# Patient Record
Sex: Female | Born: 1960 | Race: White | Hispanic: Yes | Marital: Married | Smoking: Never smoker
Health system: Southern US, Community
[De-identification: ages and names within clinical notes are randomized; demographics above are authoritative.]

## PROBLEM LIST (undated history)

## (undated) DIAGNOSIS — N289 Disorder of kidney and ureter, unspecified: Secondary | ICD-10-CM

## (undated) DIAGNOSIS — E669 Obesity, unspecified: Secondary | ICD-10-CM

## (undated) DIAGNOSIS — I10 Essential (primary) hypertension: Secondary | ICD-10-CM

## (undated) DIAGNOSIS — E119 Type 2 diabetes mellitus without complications: Secondary | ICD-10-CM

## (undated) DIAGNOSIS — H3530 Unspecified macular degeneration: Secondary | ICD-10-CM

## (undated) DIAGNOSIS — E7800 Pure hypercholesterolemia, unspecified: Secondary | ICD-10-CM

## (undated) DIAGNOSIS — Z992 Dependence on renal dialysis: Secondary | ICD-10-CM

## (undated) DIAGNOSIS — F32A Depression, unspecified: Secondary | ICD-10-CM

## (undated) DIAGNOSIS — F329 Major depressive disorder, single episode, unspecified: Secondary | ICD-10-CM

## (undated) HISTORY — PX: BACK SURGERY: SHX140

---

## 1997-11-25 ENCOUNTER — Other Ambulatory Visit: Admission: RE | Admit: 1997-11-25 | Discharge: 1997-11-25 | Payer: Self-pay | Admitting: Gynecology

## 1998-10-13 ENCOUNTER — Emergency Department (HOSPITAL_COMMUNITY): Admission: EM | Admit: 1998-10-13 | Discharge: 1998-10-13 | Payer: Self-pay | Admitting: Emergency Medicine

## 1998-10-13 ENCOUNTER — Encounter: Payer: Self-pay | Admitting: Emergency Medicine

## 1999-07-12 ENCOUNTER — Emergency Department (HOSPITAL_COMMUNITY): Admission: EM | Admit: 1999-07-12 | Discharge: 1999-07-12 | Payer: Self-pay | Admitting: Emergency Medicine

## 2000-02-14 ENCOUNTER — Emergency Department (HOSPITAL_COMMUNITY): Admission: EM | Admit: 2000-02-14 | Discharge: 2000-02-14 | Payer: Self-pay | Admitting: Emergency Medicine

## 2000-06-10 ENCOUNTER — Observation Stay (HOSPITAL_COMMUNITY): Admission: RE | Admit: 2000-06-10 | Discharge: 2000-06-11 | Payer: Self-pay | Admitting: Orthopedic Surgery

## 2000-07-07 ENCOUNTER — Inpatient Hospital Stay (HOSPITAL_COMMUNITY): Admission: RE | Admit: 2000-07-07 | Discharge: 2000-07-14 | Payer: Self-pay | Admitting: Orthopedic Surgery

## 2000-07-11 ENCOUNTER — Encounter: Payer: Self-pay | Admitting: Orthopedic Surgery

## 2000-07-13 ENCOUNTER — Encounter: Payer: Self-pay | Admitting: Infectious Diseases

## 2001-11-27 ENCOUNTER — Encounter: Payer: Self-pay | Admitting: Family Medicine

## 2001-11-27 ENCOUNTER — Inpatient Hospital Stay (HOSPITAL_COMMUNITY): Admission: EM | Admit: 2001-11-27 | Discharge: 2001-11-28 | Payer: Self-pay | Admitting: *Deleted

## 2001-12-05 ENCOUNTER — Encounter: Admission: RE | Admit: 2001-12-05 | Discharge: 2001-12-05 | Payer: Self-pay | Admitting: Family Medicine

## 2002-01-09 ENCOUNTER — Emergency Department (HOSPITAL_COMMUNITY): Admission: EM | Admit: 2002-01-09 | Discharge: 2002-01-10 | Payer: Self-pay | Admitting: Emergency Medicine

## 2002-01-10 ENCOUNTER — Encounter: Payer: Self-pay | Admitting: Emergency Medicine

## 2002-09-15 ENCOUNTER — Emergency Department (HOSPITAL_COMMUNITY): Admission: EM | Admit: 2002-09-15 | Discharge: 2002-09-16 | Payer: Self-pay | Admitting: Emergency Medicine

## 2003-11-23 ENCOUNTER — Ambulatory Visit (HOSPITAL_COMMUNITY): Admission: RE | Admit: 2003-11-23 | Discharge: 2003-11-23 | Payer: Self-pay | Admitting: *Deleted

## 2003-11-28 ENCOUNTER — Encounter: Admission: RE | Admit: 2003-11-28 | Discharge: 2003-11-28 | Payer: Self-pay | Admitting: Orthopedic Surgery

## 2003-12-10 ENCOUNTER — Ambulatory Visit (HOSPITAL_COMMUNITY): Admission: RE | Admit: 2003-12-10 | Discharge: 2003-12-10 | Payer: Self-pay | Admitting: Orthopedic Surgery

## 2004-08-26 ENCOUNTER — Ambulatory Visit (HOSPITAL_COMMUNITY): Admission: RE | Admit: 2004-08-26 | Discharge: 2004-08-26 | Payer: Self-pay | Admitting: Family Medicine

## 2004-09-06 ENCOUNTER — Inpatient Hospital Stay (HOSPITAL_COMMUNITY): Admission: EM | Admit: 2004-09-06 | Discharge: 2004-09-12 | Payer: Self-pay | Admitting: Emergency Medicine

## 2004-09-06 ENCOUNTER — Ambulatory Visit: Payer: Self-pay | Admitting: Internal Medicine

## 2004-09-06 ENCOUNTER — Ambulatory Visit: Payer: Self-pay | Admitting: Infectious Diseases

## 2004-09-25 ENCOUNTER — Ambulatory Visit: Payer: Self-pay | Admitting: Internal Medicine

## 2004-09-28 ENCOUNTER — Ambulatory Visit: Payer: Self-pay | Admitting: Internal Medicine

## 2004-10-16 ENCOUNTER — Inpatient Hospital Stay (HOSPITAL_COMMUNITY): Admission: RE | Admit: 2004-10-16 | Discharge: 2004-10-18 | Payer: Self-pay | Admitting: Orthopedic Surgery

## 2004-11-10 ENCOUNTER — Ambulatory Visit: Payer: Self-pay | Admitting: Internal Medicine

## 2004-11-18 ENCOUNTER — Ambulatory Visit: Payer: Self-pay | Admitting: Internal Medicine

## 2004-11-19 ENCOUNTER — Ambulatory Visit: Payer: Self-pay | Admitting: Internal Medicine

## 2004-11-24 ENCOUNTER — Encounter: Admission: RE | Admit: 2004-11-24 | Discharge: 2005-02-22 | Payer: Self-pay | Admitting: Orthopedic Surgery

## 2004-11-25 ENCOUNTER — Ambulatory Visit: Payer: Self-pay | Admitting: Internal Medicine

## 2004-12-01 ENCOUNTER — Ambulatory Visit (HOSPITAL_COMMUNITY): Admission: RE | Admit: 2004-12-01 | Discharge: 2004-12-01 | Payer: Self-pay | Admitting: Internal Medicine

## 2004-12-09 ENCOUNTER — Ambulatory Visit: Payer: Self-pay | Admitting: Hospitalist

## 2004-12-17 ENCOUNTER — Ambulatory Visit: Payer: Self-pay | Admitting: Internal Medicine

## 2005-01-26 ENCOUNTER — Ambulatory Visit: Payer: Self-pay | Admitting: Internal Medicine

## 2005-02-08 ENCOUNTER — Ambulatory Visit: Payer: Self-pay | Admitting: Internal Medicine

## 2006-03-10 DIAGNOSIS — E1029 Type 1 diabetes mellitus with other diabetic kidney complication: Secondary | ICD-10-CM

## 2006-03-10 DIAGNOSIS — I10 Essential (primary) hypertension: Secondary | ICD-10-CM | POA: Insufficient documentation

## 2006-03-10 DIAGNOSIS — E785 Hyperlipidemia, unspecified: Secondary | ICD-10-CM

## 2006-03-10 DIAGNOSIS — E1065 Type 1 diabetes mellitus with hyperglycemia: Secondary | ICD-10-CM

## 2013-12-12 ENCOUNTER — Inpatient Hospital Stay (HOSPITAL_COMMUNITY): Payer: PRIVATE HEALTH INSURANCE

## 2013-12-12 ENCOUNTER — Encounter (HOSPITAL_COMMUNITY): Payer: Self-pay | Admitting: Emergency Medicine

## 2013-12-12 ENCOUNTER — Emergency Department (HOSPITAL_COMMUNITY): Payer: PRIVATE HEALTH INSURANCE

## 2013-12-12 ENCOUNTER — Inpatient Hospital Stay (HOSPITAL_COMMUNITY)
Admission: EM | Admit: 2013-12-12 | Discharge: 2013-12-20 | DRG: 264 | Disposition: A | Discharge status: Home / Self Care | Payer: PRIVATE HEALTH INSURANCE | Attending: Internal Medicine | Admitting: Internal Medicine

## 2013-12-12 DIAGNOSIS — D649 Anemia, unspecified: Secondary | ICD-10-CM

## 2013-12-12 DIAGNOSIS — E1065 Type 1 diabetes mellitus with hyperglycemia: Secondary | ICD-10-CM | POA: Diagnosis present

## 2013-12-12 DIAGNOSIS — E669 Obesity, unspecified: Secondary | ICD-10-CM | POA: Diagnosis present

## 2013-12-12 DIAGNOSIS — N2581 Secondary hyperparathyroidism of renal origin: Secondary | ICD-10-CM

## 2013-12-12 DIAGNOSIS — Z992 Dependence on renal dialysis: Secondary | ICD-10-CM

## 2013-12-12 DIAGNOSIS — F329 Major depressive disorder, single episode, unspecified: Secondary | ICD-10-CM | POA: Diagnosis present

## 2013-12-12 DIAGNOSIS — I255 Ischemic cardiomyopathy: Secondary | ICD-10-CM | POA: Diagnosis present

## 2013-12-12 DIAGNOSIS — N179 Acute kidney failure, unspecified: Secondary | ICD-10-CM

## 2013-12-12 DIAGNOSIS — E1122 Type 2 diabetes mellitus with diabetic chronic kidney disease: Secondary | ICD-10-CM

## 2013-12-12 DIAGNOSIS — E1029 Type 1 diabetes mellitus with other diabetic kidney complication: Secondary | ICD-10-CM | POA: Diagnosis present

## 2013-12-12 DIAGNOSIS — E780 Pure hypercholesterolemia: Secondary | ICD-10-CM | POA: Diagnosis present

## 2013-12-12 DIAGNOSIS — Z8249 Family history of ischemic heart disease and other diseases of the circulatory system: Secondary | ICD-10-CM

## 2013-12-12 DIAGNOSIS — N189 Chronic kidney disease, unspecified: Secondary | ICD-10-CM

## 2013-12-12 DIAGNOSIS — D631 Anemia in chronic kidney disease: Secondary | ICD-10-CM

## 2013-12-12 DIAGNOSIS — J811 Chronic pulmonary edema: Secondary | ICD-10-CM | POA: Diagnosis present

## 2013-12-12 DIAGNOSIS — I5021 Acute systolic (congestive) heart failure: Secondary | ICD-10-CM

## 2013-12-12 DIAGNOSIS — Z794 Long term (current) use of insulin: Secondary | ICD-10-CM

## 2013-12-12 DIAGNOSIS — E1022 Type 1 diabetes mellitus with diabetic chronic kidney disease: Secondary | ICD-10-CM

## 2013-12-12 DIAGNOSIS — I5043 Acute on chronic combined systolic (congestive) and diastolic (congestive) heart failure: Principal | ICD-10-CM

## 2013-12-12 DIAGNOSIS — E785 Hyperlipidemia, unspecified: Secondary | ICD-10-CM

## 2013-12-12 DIAGNOSIS — N186 End stage renal disease: Secondary | ICD-10-CM | POA: Diagnosis present

## 2013-12-12 DIAGNOSIS — I509 Heart failure, unspecified: Secondary | ICD-10-CM

## 2013-12-12 DIAGNOSIS — Z6835 Body mass index (BMI) 35.0-35.9, adult: Secondary | ICD-10-CM

## 2013-12-12 DIAGNOSIS — I120 Hypertensive chronic kidney disease with stage 5 chronic kidney disease or end stage renal disease: Secondary | ICD-10-CM | POA: Diagnosis present

## 2013-12-12 DIAGNOSIS — H3530 Unspecified macular degeneration: Secondary | ICD-10-CM | POA: Diagnosis present

## 2013-12-12 DIAGNOSIS — I10 Essential (primary) hypertension: Secondary | ICD-10-CM

## 2013-12-12 DIAGNOSIS — F419 Anxiety disorder, unspecified: Secondary | ICD-10-CM | POA: Diagnosis present

## 2013-12-12 DIAGNOSIS — E1129 Type 2 diabetes mellitus with other diabetic kidney complication: Secondary | ICD-10-CM

## 2013-12-12 DIAGNOSIS — N19 Unspecified kidney failure: Secondary | ICD-10-CM

## 2013-12-12 DIAGNOSIS — R0789 Other chest pain: Secondary | ICD-10-CM

## 2013-12-12 DIAGNOSIS — N184 Chronic kidney disease, stage 4 (severe): Secondary | ICD-10-CM

## 2013-12-12 DIAGNOSIS — F418 Other specified anxiety disorders: Secondary | ICD-10-CM

## 2013-12-12 DIAGNOSIS — Z833 Family history of diabetes mellitus: Secondary | ICD-10-CM

## 2013-12-12 DIAGNOSIS — IMO0002 Reserved for concepts with insufficient information to code with codable children: Secondary | ICD-10-CM | POA: Diagnosis present

## 2013-12-12 DIAGNOSIS — R0602 Shortness of breath: Secondary | ICD-10-CM

## 2013-12-12 DIAGNOSIS — I5033 Acute on chronic diastolic (congestive) heart failure: Secondary | ICD-10-CM

## 2013-12-12 DIAGNOSIS — Z7982 Long term (current) use of aspirin: Secondary | ICD-10-CM

## 2013-12-12 DIAGNOSIS — J80 Acute respiratory distress syndrome: Secondary | ICD-10-CM | POA: Diagnosis present

## 2013-12-12 HISTORY — DX: Major depressive disorder, single episode, unspecified: F32.9

## 2013-12-12 HISTORY — DX: Obesity, unspecified: E66.9

## 2013-12-12 HISTORY — DX: Pure hypercholesterolemia, unspecified: E78.00

## 2013-12-12 HISTORY — DX: Essential (primary) hypertension: I10

## 2013-12-12 HISTORY — DX: Depression, unspecified: F32.A

## 2013-12-12 HISTORY — DX: Unspecified macular degeneration: H35.30

## 2013-12-12 HISTORY — DX: Type 2 diabetes mellitus without complications: E11.9

## 2013-12-12 LAB — CBC WITH DIFFERENTIAL/PLATELET
Basophils Absolute: 0 10*3/uL (ref 0.0–0.1)
Basophils Relative: 0 % (ref 0–1)
EOS PCT: 4 % (ref 0–5)
Eosinophils Absolute: 0.3 10*3/uL (ref 0.0–0.7)
HEMATOCRIT: 29 % — AB (ref 36.0–46.0)
HEMOGLOBIN: 9.8 g/dL — AB (ref 12.0–15.0)
LYMPHS PCT: 16 % (ref 12–46)
Lymphs Abs: 1.4 10*3/uL (ref 0.7–4.0)
MCH: 29.1 pg (ref 26.0–34.0)
MCHC: 33.8 g/dL (ref 30.0–36.0)
MCV: 86.1 fL (ref 78.0–100.0)
Monocytes Absolute: 0.5 10*3/uL (ref 0.1–1.0)
Monocytes Relative: 5 % (ref 3–12)
Neutro Abs: 6.7 10*3/uL (ref 1.7–7.7)
Neutrophils Relative %: 75 % (ref 43–77)
Platelets: 252 10*3/uL (ref 150–400)
RBC: 3.37 MIL/uL — AB (ref 3.87–5.11)
RDW: 13.7 % (ref 11.5–15.5)
WBC: 8.9 10*3/uL (ref 4.0–10.5)

## 2013-12-12 LAB — URINE MICROSCOPIC-ADD ON

## 2013-12-12 LAB — COMPREHENSIVE METABOLIC PANEL
ALK PHOS: 178 U/L — AB (ref 39–117)
ALT: 13 U/L (ref 0–35)
ANION GAP: 19 — AB (ref 5–15)
AST: 21 U/L (ref 0–37)
Albumin: 2.4 g/dL — ABNORMAL LOW (ref 3.5–5.2)
BUN: 85 mg/dL — AB (ref 6–23)
CO2: 26 meq/L (ref 19–32)
CREATININE: 6.23 mg/dL — AB (ref 0.50–1.10)
Calcium: 7.3 mg/dL — ABNORMAL LOW (ref 8.4–10.5)
Chloride: 95 mEq/L — ABNORMAL LOW (ref 96–112)
GFR, EST AFRICAN AMERICAN: 8 mL/min — AB (ref >90)
GFR, EST NON AFRICAN AMERICAN: 7 mL/min — AB (ref >90)
Glucose, Bld: 163 mg/dL — ABNORMAL HIGH (ref 70–99)
POTASSIUM: 3.6 meq/L — AB (ref 3.7–5.3)
SODIUM: 140 meq/L (ref 137–147)
TOTAL PROTEIN: 6.5 g/dL (ref 6.0–8.3)

## 2013-12-12 LAB — URINALYSIS, ROUTINE W REFLEX MICROSCOPIC
Bilirubin Urine: NEGATIVE
Glucose, UA: 100 mg/dL — AB
Ketones, ur: NEGATIVE mg/dL
LEUKOCYTES UA: NEGATIVE
NITRITE: NEGATIVE
PH: 6.5 (ref 5.0–8.0)
Specific Gravity, Urine: 1.013 (ref 1.005–1.030)
Urobilinogen, UA: 0.2 mg/dL (ref 0.0–1.0)

## 2013-12-12 LAB — GLUCOSE, CAPILLARY
Glucose-Capillary: 87 mg/dL (ref 70–99)
Glucose-Capillary: 138 mg/dL — ABNORMAL HIGH (ref 70–99)

## 2013-12-12 LAB — CREATININE, URINE, RANDOM: Creatinine, Urine: 55.61 mg/dL

## 2013-12-12 LAB — LIPASE, BLOOD: Lipase: 81 U/L — ABNORMAL HIGH (ref 11–59)

## 2013-12-12 LAB — I-STAT TROPONIN, ED: TROPONIN I, POC: 0.03 ng/mL (ref 0.00–0.08)

## 2013-12-12 LAB — TROPONIN I: Troponin I: <0.3 ng/mL (ref <0.30)

## 2013-12-12 LAB — PRO B NATRIURETIC PEPTIDE: PRO B NATRI PEPTIDE: 12460 pg/mL — AB (ref 0–125)

## 2013-12-12 MED ORDER — ASPIRIN EC 81 MG PO TBEC
81.0000 mg | DELAYED_RELEASE_TABLET | Freq: Every day | ORAL | Status: DC
  Administered 2013-12-13 – 2013-12-20 (×7): 81 mg via ORAL
  Filled 2013-12-12 (×8): qty 1

## 2013-12-12 MED ORDER — INSULIN ASPART 100 UNIT/ML ~~LOC~~ SOLN
0.0000 [IU] | Freq: Three times a day (TID) | SUBCUTANEOUS | Status: DC
  Administered 2013-12-13: 1 [IU] via SUBCUTANEOUS
  Administered 2013-12-15: 2 [IU] via SUBCUTANEOUS
  Administered 2013-12-15: 5 [IU] via SUBCUTANEOUS
  Administered 2013-12-16 (×3): 2 [IU] via SUBCUTANEOUS
  Administered 2013-12-17: 3 [IU] via SUBCUTANEOUS
  Administered 2013-12-18: 1 [IU] via SUBCUTANEOUS
  Administered 2013-12-18: 2 [IU] via SUBCUTANEOUS
  Administered 2013-12-20: 1 [IU] via SUBCUTANEOUS
  Administered 2013-12-20: 2 [IU] via SUBCUTANEOUS

## 2013-12-12 MED ORDER — FLUOXETINE HCL 20 MG PO CAPS
20.0000 mg | ORAL_CAPSULE | Freq: Every day | ORAL | Status: DC
  Administered 2013-12-12 – 2013-12-20 (×8): 20 mg via ORAL
  Filled 2013-12-12 (×9): qty 1

## 2013-12-12 MED ORDER — OXYCODONE-ACETAMINOPHEN 5-325 MG PO TABS
1.0000 | ORAL_TABLET | Freq: Once | ORAL | Status: AC
Start: 2013-12-12 — End: 2013-12-12
  Administered 2013-12-12: 1 via ORAL
  Filled 2013-12-12: qty 1

## 2013-12-12 MED ORDER — ONDANSETRON HCL 4 MG/2ML IJ SOLN: 4.0000 mg | Freq: Three times a day (TID) | INTRAMUSCULAR | Status: DC | PRN

## 2013-12-12 MED ORDER — ACETAMINOPHEN 325 MG PO TABS
650.0000 mg | ORAL_TABLET | Freq: Four times a day (QID) | ORAL | Status: DC | PRN
  Administered 2013-12-12 – 2013-12-13 (×2): 650 mg via ORAL
  Filled 2013-12-12 (×2): qty 2

## 2013-12-12 MED ORDER — METHYLDOPA 250 MG PO TABS
250.0000 mg | ORAL_TABLET | Freq: Every day | ORAL | Status: DC
  Administered 2013-12-13: 250 mg via ORAL
  Filled 2013-12-12 (×3): qty 1

## 2013-12-12 MED ORDER — ASPIRIN 81 MG PO TABS: 81.0000 mg | ORAL_TABLET | Freq: Every day | ORAL | Status: DC

## 2013-12-12 MED ORDER — GEMFIBROZIL 600 MG PO TABS
600.0000 mg | ORAL_TABLET | Freq: Two times a day (BID) | ORAL | Status: DC
Start: 2013-12-13 — End: 2013-12-16
  Administered 2013-12-13 – 2013-12-16 (×6): 600 mg via ORAL
  Filled 2013-12-12 (×9): qty 1

## 2013-12-12 MED ORDER — INSULIN ASPART 100 UNIT/ML ~~LOC~~ SOLN
5.0000 [IU] | Freq: Three times a day (TID) | SUBCUTANEOUS | Status: DC
  Administered 2013-12-13 – 2013-12-18 (×9): 5 [IU] via SUBCUTANEOUS

## 2013-12-12 MED ORDER — ACETAMINOPHEN 650 MG RE SUPP: 650.0000 mg | Freq: Four times a day (QID) | RECTAL | Status: DC | PRN

## 2013-12-12 MED ORDER — FUROSEMIDE 10 MG/ML IJ SOLN
40.0000 mg | INTRAMUSCULAR | Status: AC
  Administered 2013-12-12: 40 mg via INTRAVENOUS
  Filled 2013-12-12: qty 4

## 2013-12-12 MED ORDER — ISOSORBIDE DINITRATE 10 MG PO TABS
10.0000 mg | ORAL_TABLET | Freq: Two times a day (BID) | ORAL | Status: DC
  Administered 2013-12-12 – 2013-12-20 (×15): 10 mg via ORAL
  Filled 2013-12-12 (×17): qty 1

## 2013-12-12 MED ORDER — PANTOPRAZOLE SODIUM 40 MG PO TBEC
40.0000 mg | DELAYED_RELEASE_TABLET | Freq: Every day | ORAL | Status: DC
Start: 2013-12-13 — End: 2013-12-20
  Administered 2013-12-13 – 2013-12-20 (×7): 40 mg via ORAL
  Filled 2013-12-12 (×7): qty 1

## 2013-12-12 MED ORDER — INSULIN GLARGINE 100 UNIT/ML ~~LOC~~ SOLN
30.0000 [IU] | Freq: Every day | SUBCUTANEOUS | Status: DC
  Administered 2013-12-12 – 2013-12-19 (×7): 30 [IU] via SUBCUTANEOUS
  Filled 2013-12-12 (×9): qty 0.3

## 2013-12-12 MED ORDER — VERAPAMIL HCL 80 MG PO TABS
80.0000 mg | ORAL_TABLET | Freq: Two times a day (BID) | ORAL | Status: DC
Start: 2013-12-12 — End: 2013-12-14
  Administered 2013-12-12 – 2013-12-13 (×3): 80 mg via ORAL
  Filled 2013-12-12 (×6): qty 1

## 2013-12-12 MED ORDER — INSULIN ASPART 100 UNIT/ML ~~LOC~~ SOLN
0.0000 [IU] | Freq: Every day | SUBCUTANEOUS | Status: DC
  Administered 2013-12-15: 4 [IU] via SUBCUTANEOUS
  Administered 2013-12-17: 2 [IU] via SUBCUTANEOUS
  Administered 2013-12-18: 4 [IU] via SUBCUTANEOUS

## 2013-12-12 MED ORDER — HEPARIN SODIUM (PORCINE) 5000 UNIT/ML IJ SOLN
5000.0000 [IU] | Freq: Three times a day (TID) | INTRAMUSCULAR | Status: DC
  Administered 2013-12-12 – 2013-12-20 (×20): 5000 [IU] via SUBCUTANEOUS
  Filled 2013-12-12 (×25): qty 1

## 2013-12-12 MED ORDER — FUROSEMIDE 10 MG/ML IJ SOLN
120.0000 mg | Freq: Once | INTRAVENOUS | Status: AC
  Administered 2013-12-12: 120 mg via INTRAVENOUS
  Filled 2013-12-12 (×2): qty 12

## 2013-12-12 MED ORDER — FUROSEMIDE 10 MG/ML IJ SOLN: 40.0000 mg | INTRAMUSCULAR | Status: DC

## 2013-12-12 MED ORDER — PNEUMOCOCCAL VAC POLYVALENT 25 MCG/0.5ML IJ INJ
0.5000 mL | INJECTION | INTRAMUSCULAR | Status: AC
  Administered 2013-12-13: 0.5 mL via INTRAMUSCULAR
  Filled 2013-12-12: qty 0.5

## 2013-12-12 NOTE — ED Notes (Signed)
Pt transported to xray 

## 2013-12-12 NOTE — ED Notes (Addendum)
Pt and family reports onset of chest pressure and sob last night. Having cough and swelling to legs. Appears jaundice at triage. ekg done, airway intact.

## 2013-12-12 NOTE — Progress Notes (Signed)
Pt admitted to room 18 awake and alert but very tearful. Oriented to unit with daughters assistance. Pt speaks only [place]

## 2013-12-12 NOTE — ED Provider Notes (Signed)
CSN: [id number]     Arrival date & time 12/12/13  1334 History   First MD Initiated Contact with Patient 12/12/13 1420     Chief Complaint  Patient presents with  . Chest Pain  . Shortness of Breath     (Consider location/radiation/quality/duration/timing/severity/associated sxs/prior Treatment) HPI Comments: 53 year old female, she is from [place], she is a history of hypertension diabetes and some heart disease though she is unsure what type of heart disease is.  She is here visiting family members, she reports approximately one week of intermittent chest pains described as a tightness and associated shortness of breath which became severe today. There is associated diffuse bilateral lower extremities swelling.  She was very short of breath this morning, there's been no fevers coughing or back pain.  She is unsure if she has a hx of CHF.  Nothing makes this better, worse with exertion.  Patient is a 53 y.o. female presenting with chest pain and shortness of breath. The history is provided by the patient.  Chest Pain Associated symptoms: shortness of breath   Shortness of Breath Associated symptoms: chest pain     Past Medical History  Diagnosis Date  . Hypertension   . Diabetes mellitus without complication     type 412 since 53 years old but reports starting insulin at [age] years old  . Depression   . High cholesterol   . Obesity   . Macular degeneration disease    History reviewed. No pertinent past surgical history. Family History  Problem Relation Age of Onset  . Hypertension Father   . Diabetes Father   . Kidney failure Father   . Kidney failure Sister    History  Substance Use Topics  . Smoking status: Never Smoker   . Smokeless tobacco: Not on file  . Alcohol Use: No   OB History   Grav Para Term Preterm Abortions TAB SAB Ect Mult Living                 Review of Systems  Respiratory: Positive for shortness of breath.   Cardiovascular: Positive for chest  pain.  All other systems reviewed and are negative.     Allergies  Hydromorphone hcl  Home Medications   Prior to Admission medications   Medication Sig Start Date End Date Taking? Authorizing Provider  aspirin 81 MG tablet Take 81 mg by mouth daily.   Yes Historical Provider, MD  FLUoxetine (PROZAC) 20 MG capsule Take 20 mg by mouth daily.   Yes Historical Provider, MD  furosemide (LASIX) 40 MG tablet Take 40 mg by mouth 3 (three) times daily.   Yes Historical Provider, MD  gabapentin (NEURONTIN) 300 MG capsule Take 300 mg by mouth 3 (three) times daily.   Yes Historical Provider, MD  gemfibrozil (LOPID) 600 MG tablet Take 600 mg by mouth 2 (two) times daily before a meal.   Yes Historical Provider, MD  insulin NPH Human (HUMULIN N,NOVOLIN N) 100 UNIT/ML injection Inject 8-24 Units into the skin 2 (two) times daily. 8 units in the morning and 24 units at after supper   Yes Historical Provider, MD  insulin regular (NOVOLIN R,HUMULIN R) 100 units/mL injection Inject 10-16 Units into the skin 3 (three) times daily before meals. 16 units every morning 16 units at lunch 10 units at bedtime.  Per sliding scale   Yes Historical Provider, MD  irbesartan (AVAPRO) 150 MG tablet Take 150 mg by mouth 2 (two) times daily.   Yes  Historical Provider, MD  isosorbide dinitrate (ISORDIL) 20 MG tablet Take 10 mg by mouth daily.   Yes Historical Provider, MD  methyldopa (ALDOMET) 250 MG tablet Take 500 mg by mouth daily.   Yes Historical Provider, MD  omeprazole (PRILOSEC) 10 MG capsule Take 20 mg by mouth daily.   Yes Historical Provider, MD  verapamil (CALAN) 80 MG tablet Take 80 mg by mouth 2 (two) times daily.   Yes Historical Provider, MD   BP 129/85  Pulse 98  Temp(Src) 98.3 F (36.8 C) (Oral)  Resp 18  Ht 5\' 1"  (1.549 m)  Wt 207 lb 14.3 oz (94.3 kg)  BMI 39.30 kg/m2  SpO2 97% Physical Exam  Nursing note and vitals reviewed. Constitutional: She appears well-developed and well-nourished. No  distress.  HENT:  Head: Normocephalic and atraumatic.  Mouth/Throat: Oropharynx is clear and moist. No oropharyngeal exudate.  Eyes: Conjunctivae and EOM are normal. Pupils are equal, round, and reactive to light. Right eye exhibits no discharge. Left eye exhibits no discharge. No scleral icterus.  Neck: Normal range of motion. Neck supple. No JVD present. No thyromegaly present.  Cardiovascular: Normal rate, regular rhythm, normal heart sounds and intact distal pulses.  Exam reveals no gallop and no friction rub.   No murmur heard. Pulmonary/Chest: Effort normal. No respiratory distress. She has no wheezes. She has rales ( bilateral rales at the bases).  Abdominal: Soft. Bowel sounds are normal. She exhibits no distension and no mass. There is no tenderness.  Musculoskeletal: Normal range of motion. She exhibits edema ( 2+ pitting edema bialterally - no asymetry). She exhibits no tenderness.  Lymphadenopathy:    She has no cervical adenopathy.  Neurological: She is alert. Coordination normal.  Skin: Skin is warm and dry. No rash noted. No erythema.  Psychiatric: She has a normal mood and affect. Her behavior is normal.    ED Course  Procedures (including critical care time) Labs Review Labs Reviewed  COMPREHENSIVE METABOLIC PANEL - Abnormal; Notable for the following:    Potassium 3.6 (*)    Chloride 95 (*)    Glucose, Bld 163 (*)    BUN 85 (*)    Creatinine, Ser 6.23 (*)    Calcium 7.3 (*)    Albumin 2.4 (*)    Alkaline Phosphatase 178 (*)    Total Bilirubin <0.2 (*)    GFR calc non Af Amer 7 (*)    GFR calc Af Amer 8 (*)    Anion gap 19 (*)    All other components within normal limits  CBC WITH DIFFERENTIAL - Abnormal; Notable for the following:    RBC 3.37 (*)    Hemoglobin 9.8 (*)    HCT 29.0 (*)    All other components within normal limits  LIPASE, BLOOD - Abnormal; Notable for the following:    Lipase 81 (*)    All other components within normal limits  URINALYSIS,  ROUTINE W REFLEX MICROSCOPIC - Abnormal; Notable for the following:    Glucose, UA 100 (*)    Hgb urine dipstick SMALL (*)    Protein, ur >300 (*)    All other components within normal limits  PRO B NATRIURETIC PEPTIDE - Abnormal; Notable for the following:    Pro B Natriuretic peptide (BNP) 12460.0 (*)    All other components within normal limits  URINE MICROSCOPIC-ADD ON - Abnormal; Notable for the following:    Casts HYALINE CASTS (*)    All other components within normal limits  TROPONIN I  CREATININE, URINE, RANDOM  GLUCOSE, CAPILLARY  TROPONIN I  UREA NITROGEN, URINE  HIV ANTIBODY (ROUTINE TESTING)  HEMOGLOBIN A1C  BASIC METABOLIC PANEL  I-STAT TROPOININ, ED    Imaging Review Dg Chest 2 View  12/12/2013   CLINICAL DATA:  53 year old female acute chest pain shortness of breath weakness headache. Initial encounter.  EXAM: CHEST  2 VIEW  COMPARISON:  10/15/2004 and earlier.  FINDINGS: Diffuse increased interstitial markings. Small pleural effusions and fluid in the fissures. Indistinctness of pulmonary vasculature. Cardiac size is mildly increased, but is at the upper limits of normal. Other mediastinal contours are within normal limits. Visualized tracheal air column is within normal limits. No pneumothorax. No acute osseous abnormality identified.  IMPRESSION: Acute pulmonary edema with small bilateral pleural effusions.   Electronically Signed   By: Augusto GambleLee  Hall M.D.   On: 12/12/2013 14:52     EKG Interpretation   Date/Time:  Wednesday December 12 2013 13:44:05 EDT Ventricular Rate:  98 PR Interval:  146 QRS Duration: 92 QT Interval:  416 QTC Calculation: 531 R Axis:   68 Text Interpretation:  Normal sinus rhythm Nonspecific ST and T wave  abnormality Prolonged QT Abnormal ECG Since last tracing T wave  abnormality NOW PRESENT Confirmed by Hyacinth MeekerMILLER  MD, Deion Swift ([id number]) on  12/12/2013 2:40:01 PM      MDM   Final diagnoses:  Acute systolic congestive heart failure  Renal  failure    The patient has bilateral peripheral edema and rales consistent with having congestive heart failure or volume overload. She is known to have renal failure, her conference metabolic panel shows a creatinine of 6.2 and a BUN of 85.  She is making less urine than usual.  Consider that her kidney failure is causing less urinary outpt and lasix is now less effective.    Potassium is normal, chest x-ray confirms acute congestive heart failure with pulmonary edema, the patient however is hemodynamically stable and does not require BiPAP, intubation or IV nitroglycerin. I discussed her care with the internal medicine teaching service who will admit.  Neprhology paged.  Meds given in ED:  Medications  FLUoxetine (PROZAC) capsule 20 mg (not administered)  gemfibrozil (LOPID) tablet 600 mg (not administered)  isosorbide dinitrate (ISORDIL) tablet 10 mg (not administered)  methyldopa (ALDOMET) tablet 250 mg (not administered)  pantoprazole (PROTONIX) EC tablet 40 mg (not administered)  verapamil (CALAN) tablet 80 mg (not administered)  insulin glargine (LANTUS) injection 30 Units (not administered)  insulin aspart (novoLOG) injection 5 Units (not administered)  insulin aspart (novoLOG) injection 0-9 Units (not administered)  insulin aspart (novoLOG) injection 0-5 Units (not administered)  furosemide (LASIX) 120 mg in dextrose 5 % 50 mL IVPB (not administered)  heparin injection 5,000 Units (not administered)  acetaminophen (TYLENOL) tablet 650 mg (650 mg Oral Given 12/12/13 2052)    Or  acetaminophen (TYLENOL) suppository 650 mg ( Rectal See Alternative 12/12/13 2052)  aspirin EC tablet 81 mg (not administered)  oxyCODONE-acetaminophen (PERCOCET/ROXICET) 5-325 MG per tablet 1 tablet (1 tablet Oral Given 12/12/13 1542)  furosemide (LASIX) injection 40 mg (40 mg Intravenous Given 12/12/13 1616)      Vida RollerBrian D Brynlyn Dade, MD 12/12/13 2113

## 2013-12-12 NOTE — H&P (Signed)
Date: 12/12/2013               Patient Name:  Joan Young MRN: [id number]  DOB: 1960/09/28 Age / Sex: 53 y.o., female   PCP: No Pcp Per Patient         Medical Service: Internal Medicine Teaching Service         Attending Physician: Dr. Levert Feinstein, MD    First Contact: Dr. Tasia Catchings Pager: [phone]  Second Contact: Dr. Mikey Bussing Pager: [phone]       After Hours (After 5p/  First Contact Pager: [phone]  weekends / holidays): Second Contact Pager: [phone]   Chief Complaint: SOB with exertion, BLE swelling, chest pressure  History of Present Illness:   53 yo female with hx of HTN, DM, depression, HLD, CKD (baseline GFR ~comes with with SOB and chest pressure for 5 days. She didn't have SOB in the past but started 5 days ago. Has orthopnea and nocturnal cough. No recent cold or signs of respiratory problems. Her chest pressure is intermittent, lasts ~3-4 mins. Today she got the chest pressure again when she was walking with some radiation to her left arm. It resolved spontaneously after 5 mins. Has BLE edema which is new and also feels abdomen is larger. Having good BM without problem. Doesn't make too much urine but that's baseline.  No fever/chills/n/v/diarrhea. Had some confusion yesterday per family but patient feels well now.   Patient is visiting family in [place]. Spanish speaking only, daughter translated. Gets medical care at [place]. Was told she has ischemic cardiomyopathy, kidney abnormality from DM and HTN.   Denies smoking, alcohol use, or other drug use.   diueresed in the ED with 40mg  IV lasix. CXR showed pulmonary edema and bilateral pleural effusion.  Meds: Current Facility-Administered Medications  Medication Dose Route Frequency Provider Last Rate Last Dose  . furosemide (LASIX) injection 40 mg  40 mg Intravenous STAT Gust Rung, DO        Allergies: Allergies as of 12/12/2013 - Review Complete 12/12/2013  Allergen Reaction Noted  .  Hydromorphone hcl Other (See Comments)    Past Medical History  Diagnosis Date  . Hypertension   . Diabetes mellitus without complication     type 21 since 53 years old but reports starting insulin at 53 years old  . Depression   . High cholesterol   . Obesity   . Macular degeneration disease    History reviewed. No pertinent past surgical history. Family History  Problem Relation Age of Onset  . Hypertension Father   . Diabetes Father   . Kidney failure Father   . Kidney failure Sister    History   Social History  . Marital Status: Married    Spouse Name: N/A    Number of Children: N/A  . Years of Education: 6th grade   Occupational History  . homemaker    Social History Main Topics  . Smoking status: Never Smoker   . Smokeless tobacco: Not on file  . Alcohol Use: No  . Drug Use: No  . Sexual Activity: Not on file   Other Topics Concern  . Not on file   Social History Narrative  . No narrative on file    Review of Systems: Review of Systems  Constitutional: Negative for fever, chills, weight loss, malaise/fatigue and diaphoresis.  HENT: Negative for congestion, ear discharge, ear pain, hearing loss, nosebleeds, sore throat and tinnitus.   Eyes: Negative.   Respiratory:  Positive for shortness of breath. Negative for cough, hemoptysis, sputum production, wheezing and stridor.   Cardiovascular: Positive for chest pain, orthopnea and leg swelling. Negative for palpitations, claudication and PND.  Gastrointestinal: Negative for heartburn, nausea, vomiting, abdominal pain, diarrhea, constipation, blood in stool and melena.  Genitourinary: Negative.   Musculoskeletal: Negative.   Skin: Negative.   Neurological: Negative.  Negative for weakness and headaches.  Endo/Heme/Allergies: Negative.   Psychiatric/Behavioral: Negative.      Physical Exam: Blood pressure 125/60, pulse 95, temperature 97.8 F (36.6 C), temperature source Oral, resp. rate 18, SpO2  96.00%. Physical Exam  Constitutional: She is oriented to person, place, and time. She appears well-developed and well-nourished. No distress.  HENT:  Head: Normocephalic and atraumatic.  Right Ear: External ear normal.  Left Ear: External ear normal.  Nose: Nose normal.  Mouth/Throat: Oropharynx is clear and moist. No oropharyngeal exudate.  Eyes: Conjunctivae and EOM are normal. Pupils are equal, round, and reactive to light. Right eye exhibits no discharge. Left eye exhibits no discharge. No scleral icterus.  Neck: Normal range of motion. Neck supple. No JVD present. No tracheal deviation present. No thyromegaly present.  Cardiovascular: Normal rate, regular rhythm and intact distal pulses.  Exam reveals no gallop and no friction rub.   No murmur heard. Respiratory: Effort normal and breath sounds normal. No stridor. No respiratory distress. She has no wheezes.  Bibasilar crackles.   GI: Soft. Bowel sounds are normal. She exhibits no distension and no mass. There is no tenderness. There is no rebound and no guarding.  Musculoskeletal: Normal range of motion. She exhibits edema. She exhibits no tenderness.  Has 2+ pitting edema up to shins bilaterally. No TTP.   Lymphadenopathy:    She has no cervical adenopathy.  Neurological: She is alert and oriented to person, place, and time. She has normal reflexes. She displays normal reflexes. No cranial nerve deficit. She exhibits normal muscle tone. Coordination normal.  No asterix. Alert and oriented. Tearful after we discussed the kidney status and possible need for dialysis.  Skin: Skin is warm and dry. She is not diaphoretic.     Lab results: Basic Metabolic Panel:  Recent Labs  16/11/9608/14/15 1351  NA 140  K 3.6*  CL 95*  CO2 26  GLUCOSE 163*  BUN 85*  CREATININE 6.23*  CALCIUM 7.3*   Liver Function Tests:  Recent Labs  12/12/13 1351  AST 21  ALT 13  ALKPHOS 178*  BILITOT <0.2*  PROT 6.5  ALBUMIN 2.4*    Recent Labs   12/12/13 1351  LIPASE 81*   No results found for this basename: AMMONIA,  in the last 72 hours CBC:  Recent Labs  12/12/13 1351  WBC 8.9  NEUTROABS 6.7  HGB 9.8*  HCT 29.0*  MCV 86.1  PLT 252   Cardiac Enzymes: No results found for this basename: CKTOTAL, CKMB, CKMBINDEX, TROPONINI,  in the last 72 hours BNP:  Recent Labs  12/12/13 1351  PROBNP 12460.0*   D-Dimer: No results found for this basename: DDIMER,  in the last 72 hours CBG: No results found for this basename: GLUCAP,  in the last 72 hours Hemoglobin A1C: No results found for this basename: HGBA1C,  in the last 72 hours Fasting Lipid Panel: No results found for this basename: CHOL, HDL, LDLCALC, TRIG, CHOLHDL, LDLDIRECT,  in the last 72 hours Thyroid Function Tests: No results found for this basename: TSH, T4TOTAL, FREET4, T3FREE, THYROIDAB,  in the last 72 hours Anemia  Panel: No results found for this basename: VITAMINB12, FOLATE, FERRITIN, TIBC, IRON, RETICCTPCT,  in the last 72 hours Coagulation: No results found for this basename: LABPROT, INR,  in the last 72 hours Urine Drug Screen: Drugs of Abuse  No results found for this basename: labopia,  cocainscrnur,  labbenz,  amphetmu,  thcu,  labbarb    Alcohol Level: No results found for this basename: ETH,  in the last 72 hours Urinalysis: No results found for this basename: COLORURINE, APPERANCEUR, LABSPEC, PHURINE, GLUCOSEU, HGBUR, BILIRUBINUR, KETONESUR, PROTEINUR, UROBILINOGEN, NITRITE, LEUKOCYTESUR,  in the last 72 hours Misc. Labs:  Imaging results:  Dg Chest 2 View  12/12/2013   CLINICAL DATA:  53 year old female acute chest pain shortness of breath weakness headache. Initial encounter.  EXAM: CHEST  2 VIEW  COMPARISON:  10/15/2004 and earlier.  FINDINGS: Diffuse increased interstitial markings. Small pleural effusions and fluid in the fissures. Indistinctness of pulmonary vasculature. Cardiac size is mildly increased, but is at the upper limits  of normal. Other mediastinal contours are within normal limits. Visualized tracheal air column is within normal limits. No pneumothorax. No acute osseous abnormality identified.  IMPRESSION: Acute pulmonary edema with small bilateral pleural effusions.   Electronically Signed   By: Augusto GambleLee  Hall M.D.   On: 12/12/2013 14:52    Other results: EKG: QTC 531, VR 98. normal EKG, normal sinus rhythm.  Assessment & Plan by Problem: Principal Problem:   Acute on chronic congestive heart failure Active Problems:   Diabetes mellitus with renal manifestation   Hyperlipidemia   Essential hypertension   Pulmonary edema   Acute renal failure superimposed on stage 4 chronic kidney disease  53 yo female with HTN, CHF, CKD IV, comes in with acute respiratory distress likely from volume overload from CHF exacerbation or AKI on CKD.  Acute respiratory distress - likely from volume overload from either AKI on CKD vs CHF - CXR shows pulmonary edema with bilateral pleural effusions suggestive volume overload. hx of CHF and ischemic cardiomyopathy (last echo 6 months ago at [place] but doesn't know the results). States GFR 25% at [place]. But GFR here ~7% today.  - continue diuresis 120mg  IV Lasix. Hopefully she will respond, otherwise will need dialysis to remove fluid overload. Nephrology consulted. Will see her tomorrow - doesn't seem to have uremic syndrome currently as normal neuro exam and no asterixis but may need dialysis any time. - urine studies pending for urea and crt, also UA pending - strict I/O, daily weights, continous pulse ox, telemetry - oxygen as needed. If SOB still on [place], put on Bipap.  Acute on CKD IV - baseline GFR ~27%, GFR now 7%.  - will get UA, FEURa, [facility] nephrology consulted. May need dialysis if fails to improve with lasix as above - hold ARB - monitor kidney function closely with diuresis and I/o. - f/up nephrology recs.  CHF exacerbation  likely- last EF unknown - will get ECHO tomorrow - tele - diureses as above  Chest pressure - heart score 4- moderate. -ekg normal, tropx1. Trend trops. Repeat EKG  HTN - at home is on lasix 40mg  tid, irbesartan 150mg  bid, isosirbide dinitrate 10 qdaily, methyldopa 50mg  qdaily, and verapamil 80mg  Bid - will hold irbesartan in setting of AKI - will need to taper her off methyldopa as it can cause rebound hypertension. It should be avoided in CHF and renal disease so will slowly taper it off. - continue lasix as above -  continue isosorbide dinitrate and verapamil per home regimen  Increased anion gap without acidosis - could be acute from acute Uremic syndrome - will get ABG to clarify acid/base status  DM unspecificied type - likely type I but patient said type II.  - is on novolin 16/16/10 regimen. And NPH 8 u morning, 24 supper. Total insulin ~74 units.  - will do 30 lantus + 5 unit meal times + ssi - sensitive. Reduced insulin by 25% for AKI and gave 50% of that as basal.  Depression - continue prozac   Dispo: Disposition is deferred at this time, awaiting improvement of current medical problems. Anticipated discharge in approximately 2-3 day(s).   The patient does have a current PCP (No Pcp Per Patient) and does need an [facility] hospital follow-up appointment after discharge.  The patient does have transportation limitations that hinder transportation to clinic appointments.  Signed: Hyacinth Meeker, MD 12/12/2013, 5:44 PM

## 2013-12-12 NOTE — ED Notes (Signed)
Attempted to call report

## 2013-12-12 NOTE — ED Notes (Signed)
Pt states midsternal chest pressure x1 week, also reports SOB that becomes worse on exertion. Swelling noted to bilateral lower extremities. 5/10 chest pressure noted upon arrival. Pt states "I feel like something is sitting on my chest." pt is alert and oriented x4.

## 2013-12-13 ENCOUNTER — Inpatient Hospital Stay (HOSPITAL_COMMUNITY): Payer: PRIVATE HEALTH INSURANCE

## 2013-12-13 DIAGNOSIS — N19 Unspecified kidney failure: Secondary | ICD-10-CM

## 2013-12-13 DIAGNOSIS — N184 Chronic kidney disease, stage 4 (severe): Secondary | ICD-10-CM

## 2013-12-13 DIAGNOSIS — I059 Rheumatic mitral valve disease, unspecified: Secondary | ICD-10-CM

## 2013-12-13 LAB — BLOOD GAS, ARTERIAL
ACID-BASE EXCESS: 1.2 mmol/L (ref 0.0–2.0)
Bicarbonate: 25.6 mEq/L — ABNORMAL HIGH (ref 20.0–24.0)
Drawn by: 252031
O2 CONTENT: 1 L/min
O2 SAT: 98 %
PATIENT TEMPERATURE: 98.6
TCO2: 26.9 mmol/L (ref 0–100)
pCO2 arterial: 43 mmHg (ref 35.0–45.0)
pH, Arterial: 7.392 (ref 7.350–7.450)
pO2, Arterial: 122 mmHg — ABNORMAL HIGH (ref 80.0–100.0)

## 2013-12-13 LAB — BASIC METABOLIC PANEL
Anion gap: 16 — ABNORMAL HIGH (ref 5–15)
BUN: 85 mg/dL — AB (ref 6–23)
CO2: 26 mEq/L (ref 19–32)
Calcium: 7.2 mg/dL — ABNORMAL LOW (ref 8.4–10.5)
Chloride: 99 mEq/L (ref 96–112)
Creatinine, Ser: 6.4 mg/dL — ABNORMAL HIGH (ref 0.50–1.10)
GFR calc Af Amer: 8 mL/min — ABNORMAL LOW (ref >90)
GFR calc non Af Amer: 7 mL/min — ABNORMAL LOW (ref >90)
Glucose, Bld: 115 mg/dL — ABNORMAL HIGH (ref 70–99)
POTASSIUM: 3.3 meq/L — AB (ref 3.7–5.3)
Sodium: 141 mEq/L (ref 137–147)

## 2013-12-13 LAB — HIV ANTIBODY (ROUTINE TESTING W REFLEX): HIV 1&2 Ab, 4th Generation: NONREACTIVE

## 2013-12-13 LAB — HEMOGLOBIN A1C
Hgb A1c MFr Bld: 8.3 % — ABNORMAL HIGH (ref <5.7)
Mean Plasma Glucose: 192 mg/dL — ABNORMAL HIGH (ref <117)

## 2013-12-13 LAB — GLUCOSE, CAPILLARY
GLUCOSE-CAPILLARY: 100 mg/dL — AB (ref 70–99)
GLUCOSE-CAPILLARY: 149 mg/dL — AB (ref 70–99)
Glucose-Capillary: 111 mg/dL — ABNORMAL HIGH (ref 70–99)
Glucose-Capillary: 88 mg/dL (ref 70–99)

## 2013-12-13 LAB — UREA NITROGEN, URINE: UREA NITROGEN UR: 398 mg/dL

## 2013-12-13 LAB — IRON AND TIBC
Iron: 28 ug/dL — ABNORMAL LOW (ref 42–135)
Saturation Ratios: 13 % — ABNORMAL LOW (ref 20–55)
TIBC: 220 ug/dL — ABNORMAL LOW (ref 250–470)
UIBC: 192 ug/dL (ref 125–400)

## 2013-12-13 LAB — TROPONIN I
Troponin I: <0.3 ng/mL (ref <0.30)
Troponin I: <0.3 ng/mL (ref <0.30)

## 2013-12-13 MED ORDER — POTASSIUM CHLORIDE CRYS ER 20 MEQ PO TBCR
20.0000 meq | EXTENDED_RELEASE_TABLET | Freq: Once | ORAL | Status: AC
  Administered 2013-12-13: 20 meq via ORAL
  Filled 2013-12-13: qty 1

## 2013-12-13 MED ORDER — POLYETHYLENE GLYCOL 3350 17 G PO PACK
17.0000 g | PACK | Freq: Every day | ORAL | Status: DC | PRN
  Filled 2013-12-13: qty 1

## 2013-12-13 MED ORDER — NITROGLYCERIN 0.4 MG SL SUBL
SUBLINGUAL_TABLET | SUBLINGUAL | Status: AC
  Filled 2013-12-13: qty 1

## 2013-12-13 MED ORDER — GABAPENTIN 300 MG PO CAPS
300.0000 mg | ORAL_CAPSULE | Freq: Three times a day (TID) | ORAL | Status: DC
  Administered 2013-12-13 – 2013-12-15 (×9): 300 mg via ORAL
  Filled 2013-12-13 (×13): qty 1

## 2013-12-13 MED ORDER — CEFAZOLIN SODIUM 1-5 GM-% IV SOLN
1.0000 g | INTRAVENOUS | Status: DC
  Filled 2013-12-13: qty 50

## 2013-12-13 MED ORDER — CEFAZOLIN SODIUM-DEXTROSE 2-3 GM-% IV SOLR
2.0000 g | INTRAVENOUS | Status: AC
  Administered 2013-12-14: 2 g via INTRAVENOUS
  Filled 2013-12-13: qty 50

## 2013-12-13 MED ORDER — DEXTROSE 5 % IV SOLN
120.0000 mg | Freq: Three times a day (TID) | INTRAVENOUS | Status: DC
  Administered 2013-12-13 – 2013-12-15 (×5): 120 mg via INTRAVENOUS
  Filled 2013-12-13 (×7): qty 12

## 2013-12-13 MED ORDER — NITROGLYCERIN 0.4 MG SL SUBL
SUBLINGUAL_TABLET | SUBLINGUAL | Status: AC
  Administered 2013-12-13: 0.4 mg
  Administered 2013-12-13: 19:00:00
  Filled 2013-12-13: qty 1

## 2013-12-13 MED ORDER — FUROSEMIDE 10 MG/ML IJ SOLN
120.0000 mg | Freq: Three times a day (TID) | INTRAVENOUS | Status: DC
  Filled 2013-12-13 (×2): qty 12

## 2013-12-13 MED ORDER — POTASSIUM CHLORIDE CRYS ER 20 MEQ PO TBCR: 40.0000 meq | EXTENDED_RELEASE_TABLET | Freq: Once | ORAL | Status: DC

## 2013-12-13 MED ORDER — PERFLUTREN LIPID MICROSPHERE
1.0000 mL | INTRAVENOUS | Status: AC | PRN
  Administered 2013-12-13: 3 mL via INTRAVENOUS
  Filled 2013-12-13: qty 10

## 2013-12-13 MED ORDER — ACETAMINOPHEN 650 MG RE SUPP
650.0000 mg | RECTAL | Status: DC | PRN
  Filled 2013-12-13 (×2): qty 1

## 2013-12-13 MED ORDER — ACETAMINOPHEN 325 MG PO TABS
650.0000 mg | ORAL_TABLET | Freq: Four times a day (QID) | ORAL | Status: DC | PRN
  Administered 2013-12-13 – 2013-12-19 (×7): 650 mg via ORAL
  Filled 2013-12-13 (×7): qty 2

## 2013-12-13 NOTE — Progress Notes (Signed)
Patient complaining of shortness of breath and chest pain 8/10.  EKG and vital obtained.  Patient oxygen saturation 90% on 5L oxygen.  1 SL nitro given.  Pain down to 6/10.  Another nitro given.  Patient stated pain a 4/10 and did not want any more nitro.  Dr. On call made aware.  New orders received.  Will continue to monitor.

## 2013-12-13 NOTE — Progress Notes (Signed)
VASCULAR LAB PRELIMINARY  PRELIMINARY  PRELIMINARY  PRELIMINARY  Right  Upper Extremity Vein Map    Cephalic  Segment Diameter Depth Comment  1. Axilla 3.257mm 23.279mm   2. Mid upper arm 2.739mm 5.516mm   3. Above AC 3.532mm 5.313mm   4. In AC mm mm IV in the AC  5. Below AC 2.667mm 5.697mm   6. Mid forearm 2.686mm 4.512mm   7. Wrist 1.484mm mm    Basilic  Segment Diameter Depth Comment  1. Axilla 4.1103mm 22.362mm   2. Mid upper arm 4.1030mm 11.283mm Multiple branches  3. Above AC mm mm   4. In AC mm mm   5. Below AC mm mm   6. Mid forearm mm mm   7. Wrist mm mm       Left Upper Extremity Vein Map    Cephalic  Segment Diameter Depth Comment  1. Axilla 3.154mm 26.48mm   2. Mid upper arm 3.792mm 9.734mm   3. Above AC 4.270mm mm   4. [facility] 3.426[facility] mm   5. Below AC 2.532mm mm   6. Mid forearm 2.710mm mm   7. Wrist 1.279mm mm    Basilic  Segment Diameter Depth Comment  2. Mid upper arm 3.446mm 24.417mm   3. Above AC 3.717mm 14.767mm   4. In AC 3.361mm 3.698mm   5. Below AC 3.322mm mm   6. Mid forearm 2.152mm mm   7. Wrist mm mm    Cestone, Janann AugustHelene , RVT 12/13/2013. 4:26 PM  Sael Furches, RVS 12/13/2013, 4:26 PM    Chaston Bradburn, RVT 12/13/2013, 4:23 PM

## 2013-12-13 NOTE — Progress Notes (Signed)
Internal Medicine Night Float Interim Progress Note  S: Called by nursing at 1900, patient with increasing shortness of breath and chest pain.  Patient with decreased saturation, down to 90% on 5L.  Went to see patient who was now satting 100% on non-rebreather.  Translation per daughter because patient is Spanish speaking.  Daughter reports patient feeling better currently after receiving SL nitro x2, currently resting comfortable.  Alert and oriented.  O: Filed Vitals:   12/13/13 1906  BP: 148/65  Pulse: 94  Temp: 97.7  Resp: 20   Physical Exam  Constitutional: She is oriented to person, place, and time.  Sleeping but easily aroused.  HENT:  Head: Normocephalic and atraumatic.  Cardiovascular: Normal rate, regular rhythm and normal heart sounds.   Pulmonary/Chest: No respiratory distress. She has wheezes (Bilaterally.). She has rales (Diffuse bilaterally.).  Increased work of breathing.  Abdominal: Soft. Bowel sounds are normal. She exhibits distension. There is no tenderness. There is no rebound.  Musculoskeletal: She exhibits edema (2+ pitting to knees bilaterally.) and tenderness.  Neurological: She is alert and oriented to person, place, and time.  Skin: Skin is warm and dry.   EKG: No signs of ischemia, normal sinus rhythm.  ABG    Component Value Date/Time   PHART 7.392 12/13/2013 1940   PCO2ART 43.0 12/13/2013 1940   PO2ART 122.0* 12/13/2013 1940   HCO3 25.6* 12/13/2013 1940   TCO2 26.9 12/13/2013 1940   O2SAT 98.0 12/13/2013 1940    A/P: Patient volume overloaded on exam.  Secondary to renal failure.  Per primary team note, patient should be receiving 120 mg TID per nephrology recs.  However, she last received Lasix yesterday at midnight with some urine output.  Needs diuresis.  Patient not a smoker, wheezing likely secondary to volume overload.  ABG without significant hypercarbia.  Transfer to stepdown had been places, but she will likely improve with  Lasix. -Troponin. -Lasix 120 mg IV now, then continue TID. -Chest x-ray to assess for edema. -Continue with non-rebreather for now, wean oxygen as tolerated. -Stop transfer to step down.  Joan DagoEverett Brenten Janney, MD, PhD Internal Medicine Intern Pager: [phone] 12/13/2013,7:59 PM

## 2013-12-13 NOTE — Progress Notes (Signed)
Subjective: Still has SOB, is on 3L currently. BLE edema improved. No other symptoms.   Objective: Vital signs in last 24 hours: Filed Vitals:   12/12/13 1539 12/12/13 1838 12/12/13 2100 12/13/13 0541  BP: 125/60 157/77 129/85 141/73  Pulse: 95 89 98 85  Temp:  97.7 F (36.5 C) 98.3 F (36.8 C) 98.2 F (36.8 C)  TempSrc:  Oral Oral Oral  Resp: 18  18 17   Height:  5\' 1"  (1.549 m)    Weight:  94.3 kg (207 lb 14.3 oz)  93.8 kg (206 lb 12.7 oz)  SpO2: 96% 97% 97% 97%   Weight change:   Intake/Output Summary (Last 24 hours) at 12/13/13 1048 Last data filed at 12/13/13 0902  Gross per 24 hour  Intake    422 ml  Output    700 ml  Net   -278 ml   Vitals reviewed. General: resting in bed, NAD HEENT: PERRL, EOMI, no scleral icterus Cardiac: RRR, no rubs, murmurs or gallops Pulm: clear to auscultation bilaterally, no wheezes, rales, or rhonchi Abd: soft, nontender, nondistended, BS present Ext: warm and well perfused, has 2+ BLE edema, slightly improved from yesterday. Neuro: alert and oriented X3, cranial nerves II-XII grossly intact, strength and sensation to light touch equal in bilateral upper and lower extremities  Lab Results: Basic Metabolic Panel:  Recent Labs Lab 12/12/13 1351 12/13/13 0311  NA 140 141  K 3.6* 3.3*  CL 95* 99  CO2 26 26  GLUCOSE 163* 115*  BUN 85* 85*  CREATININE 6.23* 6.40*  CALCIUM 7.3* 7.2*   Liver Function Tests:  Recent Labs Lab 12/12/13 1351  AST 21  ALT 13  ALKPHOS 178*  BILITOT <0.2*  PROT 6.5  ALBUMIN 2.4*    Recent Labs Lab 12/12/13 1351  LIPASE 81*   No results found for this basename: AMMONIA,  in the last 168 hours CBC:  Recent Labs Lab 12/12/13 1351  WBC 8.9  NEUTROABS 6.7  HGB 9.8*  HCT 29.0*  MCV 86.1  PLT 252   Cardiac Enzymes:  Recent Labs Lab 12/12/13 1954 12/13/13 0311  TROPONINI <0.30 <0.30   BNP:  Recent Labs Lab 12/12/13 1351  PROBNP 12460.0*   D-Dimer: No results found for  this basename: DDIMER,  in the last 168 hours CBG:  Recent Labs Lab 12/12/13 1820 12/12/13 2146 12/13/13 0620  GLUCAP 87 138* 100*   Hemoglobin A1C:  Recent Labs Lab 12/12/13 1954  HGBA1C 8.3*   Fasting Lipid Panel: No results found for this basename: CHOL, HDL, LDLCALC, TRIG, CHOLHDL, LDLDIRECT,  in the last 168 hours Thyroid Function Tests: No results found for this basename: TSH, T4TOTAL, FREET4, T3FREE, THYROIDAB,  in the last 168 hours Coagulation: No results found for this basename: LABPROT, INR,  in the last 168 hours Anemia Panel: No results found for this basename: VITAMINB12, FOLATE, FERRITIN, TIBC, IRON, RETICCTPCT,  in the last 168 hours Urine Drug Screen: Drugs of Abuse  No results found for this basename: labopia, cocainscrnur, labbenz, amphetmu, thcu, labbarb    Alcohol Level: No results found for this basename: ETH,  in the last 168 hours Urinalysis:  Recent Labs Lab 12/12/13 1754  COLORURINE YELLOW  LABSPEC 1.013  PHURINE 6.5  GLUCOSEU 100*  HGBUR SMALL*  BILIRUBINUR NEGATIVE  KETONESUR NEGATIVE  PROTEINUR >300*  UROBILINOGEN 0.2  NITRITE NEGATIVE  LEUKOCYTESUR NEGATIVE   Misc. Labs:   Micro Results: No results found for this or any previous visit (from the past  240 hour(s)). Studies/Results: Dg Chest 2 View  12/12/2013   CLINICAL DATA:  53 year old female acute chest pain shortness of breath weakness headache. Initial encounter.  EXAM: CHEST  2 VIEW  COMPARISON:  10/15/2004 and earlier.  FINDINGS: Diffuse increased interstitial markings. Small pleural effusions and fluid in the fissures. Indistinctness of pulmonary vasculature. Cardiac size is mildly increased, but is at the upper limits of normal. Other mediastinal contours are within normal limits. Visualized tracheal air column is within normal limits. No pneumothorax. No acute osseous abnormality identified.  IMPRESSION: Acute pulmonary edema with small bilateral pleural effusions.    Electronically Signed   By: Augusto GambleLee  Hall M.D.   On: 12/12/2013 14:52   [place] Renal  12/13/2013   CLINICAL DATA:  Renal failure.  Initial encounter.  EXAM: RENAL/URINARY TRACT ULTRASOUND COMPLETE  COMPARISON:  None.  FINDINGS: Right Kidney:  Length: 11 cm. Echogenicity within normal limits. No mass or hydronephrosis visualized.  Left Kidney:  Length: 11 cm. Echogenicity within normal limits. No mass or hydronephrosis visualized.  Bladder:  Appears normal for degree of bladder distention.  Incidental small right pleural effusion.  IMPRESSION: 1. Negative renal ultrasound. 2. Small right pleural effusion.   Electronically Signed   By: Tiburcio PeaJonathan  Watts M.D.   On: 12/13/2013 06:54   Medications: I have reviewed the patient's current medications. Scheduled Meds: . aspirin EC  81 mg Oral Daily  . FLUoxetine  20 mg Oral Daily  . gabapentin  300 mg Oral TID  . gemfibrozil  600 mg Oral BID AC  . heparin  5,000 Units Subcutaneous 3 times per day  . insulin aspart  0-5 Units Subcutaneous QHS  . insulin aspart  0-9 Units Subcutaneous TID WC  . insulin aspart  5 Units Subcutaneous TID WC  . insulin glargine  30 Units Subcutaneous QHS  . isosorbide dinitrate  10 mg Oral BID  . methyldopa  250 mg Oral Daily  . pantoprazole  40 mg Oral Daily  . pneumococcal 23 valent vaccine  0.5 mL Intramuscular Tomorrow-1000  . verapamil  80 mg Oral BID   Continuous Infusions:  PRN Meds:.acetaminophen, acetaminophen, perflutren lipid microspheres (DEFINITY) IV suspension Assessment/Plan: Principal Problem:   Acute on chronic congestive heart failure Active Problems:   Diabetes mellitus with renal manifestation   Hyperlipidemia   Essential hypertension   Pulmonary edema   Acute renal failure superimposed on stage 4 chronic kidney disease  53 yo female with HTN, CHF, CKD IV, comes in with acute respiratory distress likely from volume overload from CHF exacerbation or AKI on CKD.  Acute respiratory distress - likely from  volume overload from either AKI on CKD vs CHF  - CXR shows pulmonary edema with bilateral pleural effusions suggestive volume overload. hx of CHF and ischemic cardiomyopathy (last echo 6 months ago at [place] but doesn't know the results). States GFR 25% at [place]. But GFR here ~7% on admission - gave 120 mg IV lasix. Didn't respond well. Only had UOP 900ml. Will likely need HD to remove fluid. Will defer to nephrology for decision. Continue lasix 120mg  IV q8hr per nephro recs. - strict I/O, daily weights, continous pulse ox, telemetry  - oxygen as needed. If SOB still on [place], put on Bipap.  Acute on CKD IV - baseline GFR ~27%, GFR now 7%.  - urine studies show intrarenal cause of kidney failure. Renal u/s normal. - nephrology consulted. Checking upep/spep, but likely etiology is long term diabetes. - vascular surgery consulted for  HD access. - hold ARB  - monitor kidney function closely with diuresis and I/o.  Combined CHF - last EF unknown  -echo 40-45% with diffuse hypokinesis. grd 1 diastolic.  - diurese with per nephro recs as above. Chest pressure - resolved. heart score 4- moderate.  -ekg normal, trops negative.  HTN - normal here. at home is on lasix 40mg  tid, irbesartan 150mg  bid, isosirbide dinitrate 10 qdaily, methyldopa 50mg  qdaily, and verapamil 80mg  Bid  - will hold irbesartan in setting of AKI  - will need to taper her off methyldopa as it can cause rebound hypertension. It should be avoided in CHF and renal disease so will slowly taper it off.  - continue isosorbide dinitrate and verapamil per home regimen  Anemia - likely 2/2 to CKD.  - Will check anemia panel. Increased anion gap without acidosis  - improving. Could be from uremia? DM unspecificied type - likely type I but patient said type II.  - is on novolin 16/16/10 regimen. And NPH 8 u morning, 24 supper. Total insulin ~74 units.  - will do 30 lantus + 5 unit meal times + ssi - sensitive. Reduced insulin by 25%  for AKI and gave 50% of that as basal.   Depression - continue prozac  Dispo: Disposition is deferred at this time, awaiting improvement of current medical problems.  Anticipated discharge in approximately 2-3 day(s).   The patient does not have a current PCP (No Pcp Per Patient) and [facility].  The patient does have transportation limitations that hinder transportation to clinic appointments.  .Services Needed at time of discharge: Y = Yes, Blank = No PT:   OT:   RN:   Equipment:   Other:     LOS: 1 day   Hyacinth Meekerasrif Wylan Gentzler, MD 12/13/2013, 10:48 AM

## 2013-12-13 NOTE — Progress Notes (Signed)
  Echocardiogram 2D Echocardiogram has been performed.  Joan Young 12/13/2013, 11:04 AM

## 2013-12-13 NOTE — H&P (Signed)
Medicine attending admission note: I personally interviewed and examined this patient in the presence of a physician, Spanish translator and I tested the accuracy of the evaluation and management plan as recorded by resident physician Dr. Hyacinth Meekerasrif Ahmed. I have reviewed the lab database, chest radiograph, an electrocardiogram.  Clinical summary: 53 year old woman with type 1 diabetes, hypertension, hyperlipidemia, and chronic renal insufficiency here visiting her daughters from [place]. He she presents with a 24-hour history of increasing dyspnea, orthopnea, chest tightness with some radiation to her left thumb, and increasing peripheral edema. Transient confusion. She was brought to the emergency department where a chest radiograph shows pulmonary edema. Pro BNP elevated at 12,460. Electrocardiogram with normal sinus rhythm questionable small Q waves in leads 2, 3 and F. No ST elevation. No comparison tracing. She was given 40 mg of furosemide IV in the emergency department.  Initial exam by Dr. Tasia CatchingsAhmed: His chronic woman in no distress. Blood pressure 125/60, pulse 95 regular, temperature 97.8, respirations 18, oxygen saturation 96% on room air. No jugular venous distention. Regular cardiac rhythm without murmur or gallop. Lungs with bibasilar crackles ,  no wheezing. 2+ pitting edema up to the shins bilaterally.  Pertinent lab random glucose 163, BUN 85, creatinine 6.2, alkaline phosphatase 178, bilirubin less than 0.2, transaminases normal, albumin 2.4, lipase 81, Hemoglobin 9.8, hematocrit 29, MCV 86, white count 8900, Troponin is undetectable x2   Current exam: Blood pressure 160/80, pulse 94, temperature 98.2 F (36.8 C), temperature source Oral, resp. rate 17, height 5\' 1"  (1.549 m), weight 206 lb 12.7 oz (93.8 kg), SpO2 97.00%. Anxious woman in no distress Lungs are now clear and resonant to percussion throughout. Regular cardiac rhythm without murmur or gallop. No jugular venous  distention. No carotid bruits. Carotid pulses 2+. Abdomen is soft obese nontender, no mass, no organomegaly, ankles with 1+ edema bilaterally. No calf tenderness.  Impression: #1. Acute congestive heart failure No evidence for acute myocardial injury at present. CHF Likely secondary to fluid overload from progressive renal insufficiency. We will continue to follow serial EKGs and troponins. Initial parenteral diuretics. Further recommendations following renal consultation. Echocardiogram planned. Cardiology intervention if indicated.  #2. Advanced renal insufficiency stage IV We have asked nephrology to help evaluate the patient. She appears to be pre-dialysis status.  #3. Long-standing insulin-dependent diabetes  #4. Essential hypertension  #5. Normochromic anemia likely anemia of renal insufficiency  #6. Hyperlipidemia  #7. Chronic anxiety and depression.  The patient has 2 daughters here in [place] and one daughter in [place]. She would prefer to return home but daughter states she is going to try to convince her to stay here in [place]. Her husband also has health problems and is not really able to provide much care for her.  Cephas DarbyJames Granfortuna, M.D., FACP

## 2013-12-13 NOTE — Consult Note (Signed)
Referring Provider: No ref. provider found Primary Care Physician:  No PCP Per Patient Primary Nephrologist:   Nephrologist in [place]  Reason for Consultation:  Chronic renal failure HPI:  Very nice lady from [place] arrived [place] the [place] to stay with family 2 weeks ago was known to have chronic renal failure stage 4 but no fistula placed. She presented with increasing edema over the last 2 weeks and we are consulted in anticipation of dialysis.   No NSAIDS  Family positive for ESRD    Past Medical History  Diagnosis Date  . Hypertension   . Diabetes mellitus without complication     type 822 since 53 years old but reports starting insulin at [age] years old  . Depression   . High cholesterol   . Obesity   . Macular degeneration disease     History reviewed. No pertinent past surgical history.  Prior to Admission medications   Medication Sig Start Date End Date Taking? Authorizing Provider  aspirin 81 MG tablet Take 81 mg by mouth daily.   Yes Historical Provider, MD  FLUoxetine (PROZAC) 20 MG capsule Take 20 mg by mouth daily.   Yes Historical Provider, MD  furosemide (LASIX) 40 MG tablet Take 40 mg by mouth 3 (three) times daily.   Yes Historical Provider, MD  gabapentin (NEURONTIN) 300 MG capsule Take 300 mg by mouth 3 (three) times daily.   Yes Historical Provider, MD  gemfibrozil (LOPID) 600 MG tablet Take 600 mg by mouth 2 (two) times daily before a meal.   Yes Historical Provider, MD  insulin NPH Human (HUMULIN N,NOVOLIN N) 100 UNIT/ML injection Inject 8-24 Units into the skin 2 (two) times daily. 8 units in the morning and 24 units at after supper   Yes Historical Provider, MD  insulin regular (NOVOLIN R,HUMULIN R) 100 units/mL injection Inject 10-16 Units into the skin 3 (three) times daily before meals. 16 units every morning 16 units at lunch 10 units at bedtime.  Per sliding scale   Yes Historical Provider, MD  irbesartan (AVAPRO) 150 MG tablet Take 150 mg by mouth 2 (two)  times daily.   Yes Historical Provider, MD  isosorbide dinitrate (ISORDIL) 20 MG tablet Take 10 mg by mouth daily.   Yes Historical Provider, MD  methyldopa (ALDOMET) 250 MG tablet Take 500 mg by mouth daily.   Yes Historical Provider, MD  omeprazole (PRILOSEC) 10 MG capsule Take 20 mg by mouth daily.   Yes Historical Provider, MD  verapamil (CALAN) 80 MG tablet Take 80 mg by mouth 2 (two) times daily.   Yes Historical Provider, MD    Current Facility-Administered Medications  Medication Dose Route Frequency Provider Last Rate Last Dose  . acetaminophen (TYLENOL) suppository 650 mg  650 mg Rectal Q4H PRN Gust RungErik C Hoffman, DO       Or  . acetaminophen (TYLENOL) tablet 650 mg  650 mg Oral Q6H PRN Gust RungErik C Hoffman, DO   650 mg at 12/13/13 1300  . aspirin EC tablet 81 mg  81 mg Oral Daily Levert FeinsteinJames M Granfortuna, MD   81 mg at 12/13/13 1133  . FLUoxetine (PROZAC) capsule 20 mg  20 mg Oral Daily Gust RungErik C Hoffman, DO   20 mg at 12/13/13 1133  . gabapentin (NEURONTIN) capsule 300 mg  300 mg Oral TID Luisa DagoEverett Moding, MD   300 mg at 12/13/13 0655  . gemfibrozil (LOPID) tablet 600 mg  600 mg Oral BID AC Gust RungErik C Hoffman, DO  600 mg at 12/13/13 0655  . heparin injection 5,000 Units  5,000 Units Subcutaneous 3 times per day Gust RungErik C Hoffman, DO   5,000 Units at 12/13/13 1300  . insulin aspart (novoLOG) injection 0-5 Units  0-5 Units Subcutaneous QHS Gust RungErik C Hoffman, DO      . insulin aspart (novoLOG) injection 0-9 Units  0-9 Units Subcutaneous TID WC Gust RungErik C Hoffman, DO      . insulin aspart (novoLOG) injection 5 Units  5 Units Subcutaneous TID WC Gust RungErik C Hoffman, DO   5 Units at 12/13/13 [phone]  . insulin glargine (LANTUS) injection 30 Units  30 Units Subcutaneous QHS Gust RungErik C Hoffman, DO   30 Units at 12/12/13 2252  . isosorbide dinitrate (ISORDIL) tablet 10 mg  10 mg Oral BID Gust RungErik C Hoffman, DO   10 mg at 12/13/13 1133  . methyldopa (ALDOMET) tablet 250 mg  250 mg Oral Daily Gust RungErik C Hoffman, DO   250 mg at 12/13/13 1133  .  pantoprazole (PROTONIX) EC tablet 40 mg  40 mg Oral Daily Gust RungErik C Hoffman, DO   40 mg at 12/13/13 1133  . polyethylene glycol (MIRALAX / GLYCOLAX) packet 17 g  17 g Oral Daily PRN Gust RungErik C Hoffman, DO      . verapamil (CALAN) tablet 80 mg  80 mg Oral BID Gust RungErik C Hoffman, DO   80 mg at 12/13/13 1133    Allergies as of 12/12/2013 - Review Complete 12/12/2013  Allergen Reaction Noted  . Hydromorphone hcl Other (See Comments)     Family History  Problem Relation Age of Onset  . Hypertension Father   . Diabetes Father   . Kidney failure Father   . Kidney failure Sister     History   Social History  . Marital Status: Married    Spouse Name: N/A    Number of Children: N/A  . Years of Education: 6th grade   Occupational History  . homemaker    Social History Main Topics  . Smoking status: Never Smoker   . Smokeless tobacco: Not on file  . Alcohol Use: No  . Drug Use: No  . Sexual Activity: Not on file   Other Topics Concern  . Not on file   Social History Narrative  . No narrative on file    Review of Systems: Gen: Weakness and Malaise HEENT: No visual complaints CV: positive for orthopnea, PND, peripheral edema,  Resp: positive for dyspnea at rest, dyspnea with exercise,  GI: Denies vomiting blood, jaundice, and fecal incontinence.   Denies dysphagia or odynophagia. GU : Denies urinary burning, blood in urine, urinary frequency, urinary hesitancy, nocturnal urination, and urinary incontinence.  No renal calculi. MS: Denies joint pain, limitation of movement, and swelling, stiffness, low back pain, extremity pain. Denies muscle weakness, cramps, atrophy.  No use of non steroidal antiinflammatory drugs. Derm: Denies rash, itching, dry skin, hives, moles, warts, or unhealing ulcers.  Psych: Denies depression, anxiety, memory loss, suicidal ideation, hallucinations, paranoia, and confusion. Heme: Denies bruising, bleeding, and enlarged lymph nodes. Neuro: No headache.  No  diplopia. No dysarthria.  No dysphasia.  No history of CVA.  No Seizures. No paresthesias.  No weakness. Endocrine positive diabetes No Thyroid disease.  No Adrenal disease.  Physical Exam: Vital signs in last 24 hours: Temp:  [97.7 F (36.5 C)-98.3 F (36.8 C)] 98.2 F (36.8 C) (10/15 0541) Pulse Rate:  [85-99] 94 (10/15 1132) Resp:  [17-22] 17 (10/15 0541) BP: (125-160)/(60-85) 160/80 mmHg (  10/15 1132) SpO2:  [96 %-97 %] 97 % (10/15 0541) Weight:  [93.8 kg (206 lb 12.7 oz)-94.3 kg (207 lb 14.3 oz)] 93.8 kg (206 lb 12.7 oz) (10/15 0541) Last BM Date: 12/11/13 General:   Ill appearing using oxygen Head:  Normocephalic and atraumatic. Eyes:  Sclera clear, no icterus.   Conjunctiva pink. Ears:  Normal auditory acuity. Nose:  No deformity, discharge,  or lesions. Mouth:  No deformity or lesions, dentition normal. Neck:  JVP elevated Lungs:  Diminished at bases Heart:  Regular rate and rhythm; no murmurs, clicks, rubs,  or gallops. Abdomen:  Soft, nontender and nondistended. No masses, hepatosplenomegaly or hernias noted. Normal bowel sounds, without guarding, and without rebound.   Msk:  Symmetrical without gross deformities. Normal posture. Pulses:  No carotid, renal, femoral bruits. DP and PT symmetrical and equal Extremities:  2-3 + edema Neurologic:  Alert and  oriented x4;  grossly normal neurologically. Skin:  Intact without significant lesions or rashes. Cervical Nodes:  No significant cervical adenopathy. Psych:  Alert and cooperative. Normal mood and affect.  Intake/Output from previous day: 10/14 0701 - 10/15 0700 In: 62 [IV Piggyback:62] Out: 100 [Urine:100] Intake/Output this shift: Total I/O In: 420 [P.O.:420] Out: 950 [Urine:950]  Lab Results:  Recent Labs  12/12/13 1351  WBC 8.9  HGB 9.8*  HCT 29.0*  PLT 252   BMET  Recent Labs  12/12/13 1351 12/13/13 0311  NA 140 141  K 3.6* 3.3*  CL 95* 99  CO2 26 26  GLUCOSE 163* 115*  BUN 85* 85*   CREATININE 6.23* 6.40*  CALCIUM 7.3* 7.2*   LFT  Recent Labs  12/12/13 1351  PROT 6.5  ALBUMIN 2.4*  AST 21  ALT 13  ALKPHOS 178*  BILITOT <0.2*   PT/INR No results found for this basename: LABPROT, INR,  in the last 72 hours Hepatitis Panel No results found for this basename: HEPBSAG, HCVAB, HEPAIGM, HEPBIGM,  in the last 72 hours  Studies/Results: Dg Chest 2 View  12/12/2013   CLINICAL DATA:  53 year old female acute chest pain shortness of breath weakness headache. Initial encounter.  EXAM: CHEST  2 VIEW  COMPARISON:  10/15/2004 and earlier.  FINDINGS: Diffuse increased interstitial markings. Small pleural effusions and fluid in the fissures. Indistinctness of pulmonary vasculature. Cardiac size is mildly increased, but is at the upper limits of normal. Other mediastinal contours are within normal limits. Visualized tracheal air column is within normal limits. No pneumothorax. No acute osseous abnormality identified.  IMPRESSION: Acute pulmonary edema with small bilateral pleural effusions.   Electronically Signed   By: Augusto Gamble M.D.   On: 12/12/2013 14:52   US Renal  12/13/2013   CLINICAL DATA:  Renal failure.  Initial encounter.  EXAM: RENAL/URINARY TRACT ULTRASOUND COMPLETE  COMPARISON:  None.  FINDINGS: Right Kidney:  Length: 11 cm. Echogenicity within normal limits. No mass or hydronephrosis visualized.  Left Kidney:  Length: 11 cm. Echogenicity within normal limits. No mass or hydronephrosis visualized.  Bladder:  Appears normal for degree of bladder distention.  Incidental small right pleural effusion.  IMPRESSION: 1. Negative renal ultrasound. 2. Small right pleural effusion.   Electronically Signed   By: Tiburcio Pea M.D.   On: 12/13/2013 06:54    Assessment/Plan:  Chronic renal failure will check SPEP UPEP and renal U/S  Although diabetes is most likely etiology. She is not interested in PD at this time. She will need vein mapping and will need access placed  probably  this admission. There is no asterixis and no pericardial rub and no imminent need for dialysis . I am relieved to see that the ARB was stopped.  Volume  Agree with diuresis and 2 D Echo  Anemia check iron stores  Bones check PTH  Will consult [facility]   LOS: 1 Joan Young W @TODAY @1 :30 PM

## 2013-12-13 NOTE — Consult Note (Addendum)
[facility] OF [place] CONSULT NOTE  Agree with note below. The history is obtained through her daughter who translates for her. She is right-handed. Vein mapping shows a reasonable left upper arm cephalic vein. She has a normal left radial pulse and left brachial pulse. She appears to be a good candidate for a left brachiocephalic AV fistula. I discussed the indications for the procedure and the potential complications, including, but not limited to, bleeding, wound healing problems, thrombosis of the fistula or the need for subsequent interventions. All of her questions were answered she is agreeable to proceed. She was added on for tomorrow schedule.  Christopher Dickson, MD, FACS Beeper [phone] 5:00 PM  Reason for Consult: ESRD Referring Physician: Martin W Webb, MD  History of Present Illness: 53 y/o female who is stage 4 renal failure and needs permanent access.  [place] the [place] from [place].  Her past medical history includes:   type 1 diabetes, hypertension, and hyperlipidemia.  She takes an Asprin 81 mg daily, gemfibrozil for cholesterol and lasix for hypertension.  Pt meds include: ASA: Yes Other anticoagulants/antiplatelets: None  Past Medical History  Diagnosis Date  . Hypertension   . Diabetes mellitus without complication     type 2 since 53 years old but reports starting insulin at 53 years old  . Depression   . High cholesterol   . Obesity   . Macular degeneration disease     History reviewed. No pertinent past surgical history.  Social History History  Substance Use Topics  . Smoking status: Never Smoker   . Smokeless tobacco: Not on file  . Alcohol Use: No    Family History Family History  Problem Relation Age of Onset  . Hypertension Father   . Diabetes Father   . Kidney failure Father   . Kidney failure Sister     Allergies  Allergen Reactions  . Hydromorphone Hcl Other (See Comments)    Doesn't remember    REVIEW OF SYSTEMS  General: [ ] Weight loss, [ ] Fever, [ ] chills [x] wekness Neurologic: [ ] Dizziness, [ ] Blackouts, [ ] Seizure [ ] Stroke, [ ] "Mini stroke", [ ] Slurred speech, [ ] Temporary blindness; [ ] weakness in arms or legs, [ ] Hoarseness [ ] Dysphagia Cardiac: [ ] Chest pain/pressure, [x ] Shortness of breath at rest [ ] Shortness of breath with exertion, [ ] Atrial fibrillation or irregular heartbeat  Vascular: [ ] Pain in legs with walking, [ ] Pain in legs at rest, [ ] Pain in legs at night,  [ ] Non-healing ulcer, [ ] Blood clot in vein/DVT,   Pulmonary: [ ] Home oxygen, [ ] Productive cough, [ ] Coughing up blood, [ ] Asthma,  [ ] Wheezing [ ] COPD Musculoskeletal:  [ ] Arthritis, [ ] Low back pain, [ ] Joint pain Hematologic: [ ] Easy Bruising, [ ] Anemia; [ ] Hepatitis Gastrointestinal: [ ] Blood in stool, [ ] Gastroesophageal Reflux/heartburn, Urinary: [x ] chronic Kidney disease, [ ] on HD - [ ] MWF or [ ] TTHS, [ ] Burning with urination, [ ] Difficulty urinating Skin: [ ] Rashes, [ ] Wounds Psychological: [ ] Anxiety, [ ] Depression  Physical Examination Filed Vitals:   12/12/13 1838 12/12/13 2100 12/13/13 0541 12/13/13 1132  BP: 157/77 129/85 141/73 160/80  Pulse: 89 98 85 94  Temp: 97.7 F (36.5 C) 98.3 F (36.8 C) 98.2 F (36.8   C)   TempSrc: Oral Oral Oral   Resp:  18 17   Height: 5\' 1"  (1.549 m)     Weight: 207 lb 14.3 oz (94.3 kg)  206 lb 12.7 oz (93.8 kg)   SpO2: 97% 97% 97%    Body mass index is 39.09 kg/(m^2).  General:  WDWN in NAD Gait: Normal HENT: WNL Eyes: Pupils equal Pulmonary: normal non-labored breathing , without Rales, rhonchi,  wheezing Cardiac: RRR, without  Murmurs, rubs or gallops; No carotid bruits Abdomen: soft, NT, no masses Skin: no rashes, ulcers noted;  no Gangrene , no cellulitis; no open wounds;  Vascular Exam/Pulses:palpable radial and brachial pulses intact 2+ equally Musculoskeletal: no muscle wasting or  atrophy; no edema  Neurologic: A&O X 3; Appropriate Affect ;  SENSATION: normal; MOTOR FUNCTION: 5/5 Symmetric Speech is fluent/normal  Significant Diagnostic Studies: CBC Lab Results  Component Value Date   WBC 8.9 12/12/2013   HGB 9.8* 12/12/2013   HCT 29.0* 12/12/2013   MCV 86.1 12/12/2013   PLT 252 12/12/2013   BMET    Component Value Date/Time   NA 141 12/13/2013 0311   K 3.3* 12/13/2013 0311   CL 99 12/13/2013 0311   CO2 26 12/13/2013 0311   GLUCOSE 115* 12/13/2013 0311   BUN 85* 12/13/2013 0311   CREATININE 6.40* 12/13/2013 0311   CALCIUM 7.2* 12/13/2013 0311   GFRNONAA 7* 12/13/2013 0311   GFRAA 8* 12/13/2013 0311   Estimated Creatinine Clearance: 10.9 ml/min (by C-G formula based on Cr of 6.4).  COAG No results found for this basename: INR, PROTIME   Non-Invasive Vascular Imaging: pending vein mapping  I have independently interpreted her vein mapping which shows that she has a reasonable left upper arm cephalic vein.  Waverly Ferrarihristopher Dickson, MD, FACS  ASSESSMENT/PLAN:    Joan Young Young, Joan Young [facility] 12/13/2013 3:20 PM

## 2013-12-13 NOTE — Care Management Note (Addendum)
    Page 1 of 2   12/20/2013     11:57:06 AM CARE MANAGEMENT NOTE 12/20/2013  Patient:  Donette LarryCRUZ-MORA,Maela CRUZ   Account Number:  [id number]  Date Initiated:  12/13/2013  Documentation initiated by:  Oletta CohnWOOD,Kyah Buesing  Subjective/Objective Assessment:   53 yo female with hx of HTN, DM, depression, HLD, CKD (baseline GFR  comes with with SOB and chest pressure for 5 days.//From [place] family.     Action/Plan:   diuresis 120mg  IV Lasix.//Acess for disposition needs.   Anticipated DC Date:  12/16/2013   Anticipated DC Plan:  HOME/SELF CARE  In-house referral  Financial Counselor  Clinical Social Worker      DC Planning Services  CM consult  HF Clinic      Choice offered to / List presented to:  C-4 Adult Children        HH arranged  HH[facility] RN      [facility]   Status of service:  Completed, signed off Medicare Important Message given?   (If response is "NO", the following Medicare IM given date fields will be blank) Date Medicare IM given:   Medicare IM given by:   Date Additional Medicare IM given:   Additional Medicare IM given by:    Discharge Disposition:  HOME W HOME HEALTH SERVICES  Per UR Regulation:  Reviewed for med. necessity/level of care/duration of stay  If discussed at Long Length of Stay Meetings, dates discussed:   12/18/2013  12/20/2013    Comments:  12/20/13 1045 Plez Belton J. Lucretia RoersWood, RN, BSN, [employer] [phone] Spoke with pt and daughter at bedside with interpreter Darrelyn Hillock(Graciella) regarding discharge planning for Home Health Services. Offered pt list of home health agencies to choose from.  Pt chose [facility] to render services. Jodene NamMary Hickling, RN [facility] notified.  No DME needs identified at this time. [facility] and set up PCP services.  Pt and family aware that [facility].  12/18/13 1530 Tamerra Merkley, RN, BSN, [place]  [phone] Patient scheduled for left arm brachial-cephalic fistula tomorrow 10/21 with Dr. Imogene Burnhen. NPO past midnight.

## 2013-12-14 ENCOUNTER — Encounter (HOSPITAL_COMMUNITY)
Admission: EM | Disposition: A | Discharge status: Home / Self Care | Payer: Self-pay | Source: Home / Self Care | Attending: Internal Medicine

## 2013-12-14 ENCOUNTER — Encounter (HOSPITAL_COMMUNITY): Payer: PRIVATE HEALTH INSURANCE | Admitting: Certified Registered"

## 2013-12-14 ENCOUNTER — Inpatient Hospital Stay (HOSPITAL_COMMUNITY): Payer: PRIVATE HEALTH INSURANCE | Admitting: Certified Registered"

## 2013-12-14 ENCOUNTER — Inpatient Hospital Stay (HOSPITAL_COMMUNITY): Payer: PRIVATE HEALTH INSURANCE

## 2013-12-14 DIAGNOSIS — N189 Chronic kidney disease, unspecified: Secondary | ICD-10-CM

## 2013-12-14 DIAGNOSIS — D631 Anemia in chronic kidney disease: Secondary | ICD-10-CM | POA: Diagnosis present

## 2013-12-14 LAB — GLUCOSE, CAPILLARY
GLUCOSE-CAPILLARY: 120 mg/dL — AB (ref 70–99)
GLUCOSE-CAPILLARY: 198 mg/dL — AB (ref 70–99)
Glucose-Capillary: 113 mg/dL — ABNORMAL HIGH (ref 70–99)
Glucose-Capillary: 115 mg/dL — ABNORMAL HIGH (ref 70–99)
Glucose-Capillary: 110 mg/dL — ABNORMAL HIGH (ref 70–99)

## 2013-12-14 LAB — BASIC METABOLIC PANEL
ANION GAP: 19 — AB (ref 5–15)
BUN: 83 mg/dL — ABNORMAL HIGH (ref 6–23)
CO2: 23 mEq/L (ref 19–32)
Calcium: 7.7 mg/dL — ABNORMAL LOW (ref 8.4–10.5)
Chloride: 95 mEq/L — ABNORMAL LOW (ref 96–112)
Creatinine, Ser: 6.34 mg/dL — ABNORMAL HIGH (ref 0.50–1.10)
GFR, EST AFRICAN AMERICAN: 8 mL/min — AB (ref >90)
GFR, EST NON AFRICAN AMERICAN: 7 mL/min — AB (ref >90)
GLUCOSE: 131 mg/dL — AB (ref 70–99)
POTASSIUM: 4 meq/L (ref 3.7–5.3)
Sodium: 137 mEq/L (ref 137–147)

## 2013-12-14 LAB — CBC
HCT: 25.1 % — ABNORMAL LOW (ref 36.0–46.0)
Hemoglobin: 8.4 g/dL — ABNORMAL LOW (ref 12.0–15.0)
MCH: 28.3 pg (ref 26.0–34.0)
MCHC: 33.5 g/dL (ref 30.0–36.0)
MCV: 84.5 fL (ref 78.0–100.0)
PLATELETS: 221 10*3/uL (ref 150–400)
RBC: 2.97 MIL/uL — AB (ref 3.87–5.11)
RDW: 13.7 % (ref 11.5–15.5)
WBC: 5.4 10*3/uL (ref 4.0–10.5)

## 2013-12-14 LAB — PARATHYROID HORMONE, INTACT (NO CA): PTH: 521 pg/mL — ABNORMAL HIGH (ref 14–64)

## 2013-12-14 SURGERY — ARTERIOVENOUS (AV) FISTULA CREATION
Anesthesia: Monitor Anesthesia Care

## 2013-12-14 MED ORDER — SODIUM CHLORIDE 0.9 % IR SOLN
Status: DC | PRN
  Administered 2013-12-14: 14:00:00

## 2013-12-14 MED ORDER — HYDRALAZINE HCL 10 MG PO TABS
10.0000 mg | ORAL_TABLET | Freq: Three times a day (TID) | ORAL | Status: DC
  Administered 2013-12-14 – 2013-12-17 (×9): 10 mg via ORAL
  Filled 2013-12-14 (×12): qty 1

## 2013-12-14 MED ORDER — LIDOCAINE HCL (CARDIAC) 20 MG/ML IV SOLN
INTRAVENOUS | Status: DC | PRN
  Administered 2013-12-14: 50 mg via INTRAVENOUS

## 2013-12-14 MED ORDER — LIDOCAINE HCL (CARDIAC) 20 MG/ML IV SOLN
INTRAVENOUS | Status: AC
  Filled 2013-12-14: qty 5

## 2013-12-14 MED ORDER — 0.9 % SODIUM CHLORIDE (POUR BTL) OPTIME
TOPICAL | Status: DC | PRN
  Administered 2013-12-14: 1000 mL

## 2013-12-14 MED ORDER — LIDOCAINE-EPINEPHRINE (PF) 1 %-1:200000 IJ SOLN
INTRAMUSCULAR | Status: DC | PRN
  Administered 2013-12-14: 30 mL

## 2013-12-14 MED ORDER — SODIUM CHLORIDE 0.9 % IV SOLN
INTRAVENOUS | Status: DC
  Administered 2013-12-14 (×2): via INTRAVENOUS

## 2013-12-14 MED ORDER — MIDAZOLAM HCL 2 MG/2ML IJ SOLN
INTRAMUSCULAR | Status: AC
  Filled 2013-12-14: qty 2

## 2013-12-14 MED ORDER — PROPOFOL 10 MG/ML IV BOLUS
INTRAVENOUS | Status: AC
  Filled 2013-12-14: qty 20

## 2013-12-14 MED ORDER — CEFAZOLIN SODIUM-DEXTROSE 2-3 GM-% IV SOLR
INTRAVENOUS | Status: DC | PRN
  Administered 2013-12-14: 2 g via INTRAVENOUS

## 2013-12-14 MED ORDER — LIDOCAINE-EPINEPHRINE (PF) 1 %-1:200000 IJ SOLN
INTRAMUSCULAR | Status: AC
  Filled 2013-12-14: qty 10

## 2013-12-14 MED ORDER — FENTANYL CITRATE 0.05 MG/ML IJ SOLN
INTRAMUSCULAR | Status: AC
  Filled 2013-12-14: qty 5

## 2013-12-14 MED ORDER — PROPOFOL INFUSION 10 MG/ML OPTIME
INTRAVENOUS | Status: DC | PRN
  Administered 2013-12-14: 25 ug/kg/min via INTRAVENOUS

## 2013-12-14 MED ORDER — MIDAZOLAM HCL 5 MG/5ML IJ SOLN
INTRAMUSCULAR | Status: DC | PRN
  Administered 2013-12-14 (×2): 1 mg via INTRAVENOUS

## 2013-12-14 SURGICAL SUPPLY — 31 items
ADH SKN CLS APL DERMABOND .7 (GAUZE/BANDAGES/DRESSINGS) ×1
ARMBAND PINK RESTRICT EXTREMIT (MISCELLANEOUS) ×4 IMPLANT
BLADE SURG 10 STRL SS (BLADE) ×4 IMPLANT
CANISTER SUCTION 2500CC (MISCELLANEOUS) ×4 IMPLANT
CLIP TI MEDIUM 6 (CLIP) ×4 IMPLANT
CLIP TI WIDE RED SMALL 6 (CLIP) ×4 IMPLANT
COVER PROBE W GEL 5X96 (DRAPES) ×4 IMPLANT
COVER SURGICAL LIGHT HANDLE (MISCELLANEOUS) ×4 IMPLANT
DERMABOND ADVANCED (GAUZE/BANDAGES/DRESSINGS) ×2
DERMABOND ADVANCED .7 DNX12 (GAUZE/BANDAGES/DRESSINGS) ×2 IMPLANT
ELECT REM PT RETURN 9FT ADLT (ELECTROSURGICAL) ×3
ELECTRODE REM PT RTRN 9FT ADLT (ELECTROSURGICAL) ×2 IMPLANT
GLOVE BIOGEL PI IND STRL 7.5 (GLOVE) ×2 IMPLANT
GLOVE BIOGEL PI INDICATOR 7.5 (GLOVE) ×2
GLOVE SURG SS PI 7.5 STRL IVOR (GLOVE) ×4 IMPLANT
GOWN STRL REUS W/ TWL LRG LVL3 (GOWN DISPOSABLE) ×4 IMPLANT
GOWN STRL REUS W/ TWL XL LVL3 (GOWN DISPOSABLE) ×2 IMPLANT
GOWN STRL REUS W/TWL LRG LVL3 (GOWN DISPOSABLE) ×6
GOWN STRL REUS W/TWL XL LVL3 (GOWN DISPOSABLE) ×3
HEMOSTAT SNOW SURGICEL 2X4 (HEMOSTASIS) IMPLANT
KIT BASIN OR (CUSTOM PROCEDURE TRAY) ×4 IMPLANT
KIT ROOM TURNOVER OR (KITS) ×4 IMPLANT
NS IRRIG 1000ML POUR BTL (IV SOLUTION) ×4 IMPLANT
PACK CV ACCESS (CUSTOM PROCEDURE TRAY) ×4 IMPLANT
PAD ARMBOARD 7.5X6 YLW CONV (MISCELLANEOUS) ×8 IMPLANT
SUT PROLENE 6 0 CC (SUTURE) ×4 IMPLANT
SUT VIC AB 3-0 SH 27 (SUTURE) ×3
SUT VIC AB 3-0 SH 27X BRD (SUTURE) ×2 IMPLANT
SUT VICRYL 4-0 PS2 18IN ABS (SUTURE) IMPLANT
UNDERPAD 30X30 INCONTINENT (UNDERPADS AND DIAPERS) ×4 IMPLANT
WATER STERILE IRR 1000ML POUR (IV SOLUTION) ×4 IMPLANT

## 2013-12-14 NOTE — Op Note (Signed)
Patient's procedure was canceled today given her significant shortness of breath.  She could not lie flat.  Anesthesia did not think that intubation would be in her best interest.  Therefore I elected to cancel the procedure.  The patient will be optimized from a pulmonary standpoint and we can bring her back later for fistula creation.

## 2013-12-14 NOTE — OR Nursing (Signed)
Dr  Myra GianottiBrabham cancelled procedure after prepping due to her shortness of breath and inability to lie flat and still for operating procedure

## 2013-12-14 NOTE — Progress Notes (Signed)
Patient speak Spanish only and is alert and oriented x 4. Denies c/o pain or shortness of breath at present time. Lungs are clear to ausculation but diminished in the b/l bases. Patient maintained on telemetry and is Sinus tach with a heart rate of 110. Will continue to monitor.  Sharlene Doryanya Jaksen Fiorella, RN

## 2013-12-14 NOTE — Transfer of Care (Signed)
Immediate Anesthesia Transfer of Care Note  Patient: Michaelle CopasYAMILETH Cruz CRUZ-MORA  Procedure(s) Performed: Procedure(s): Cancelled  ARTERIOVENOUS  FISTULA CREATION-LEFT Arm BRACHIOCEPHALIC (Left)  Patient Location: PACU  Anesthesia Type:MAC  Level of Consciousness: awake, alert  and oriented  Airway & Oxygen Therapy: Patient connected to face mask oxygen  Post-op Assessment: Report given to PACU RN  Post vital signs: stable  Complications: procedure cancelled

## 2013-12-14 NOTE — Progress Notes (Signed)
  Date: 12/14/2013  Patient name: Joan [facility]YAMILETH [facility]  Medical record number: [id number]  Date of birth: 05-28-1960   This patient has been seen and the plan of care was discussed with the house staff. Please see their note for complete details. I concur with their findings with the following additions/corrections:  Patient for vascular procedure today for HD in near future.   Inez CatalinaEmily B Madeleine Fenn, MD 12/14/2013, 3:22 PM

## 2013-12-14 NOTE — Interval H&P Note (Signed)
History and Physical Interval Note:  12/14/2013 1:37 PM  Joan Young  has presented today for surgery, with the diagnosis of End Stage Renal Disease  The various methods of treatment have been discussed with the patient and family. After consideration of risks, benefits and other options for treatment, the patient has consented to  Procedure(s): ARTERIOVENOUS (AV) FISTULA CREATION-LEFT BRACHIOCEPHALIC (Left) as a surgical intervention .  The patient's history has been reviewed, patient examined, no change in status, stable for surgery.  I have reviewed the patient's chart and labs.  Questions were answered to the patient's satisfaction.     Myra GianottiBRABHAM IV, Lala LundV. WELLS  Spoke with patient and daughter about risk and benefits as well as details of proceedure

## 2013-12-14 NOTE — H&P (View-Only) (Signed)
[facility] OF [place] CONSULT NOTE  Agree with note below. The history is obtained through her daughter who translates for her. She is right-handed. Vein mapping shows a reasonable left upper arm cephalic vein. She has a normal left radial pulse and left brachial pulse. She appears to be a good candidate for a left brachiocephalic AV fistula. I discussed the indications for the procedure and the potential complications, including, but not limited to, bleeding, wound healing problems, thrombosis of the fistula or the need for subsequent interventions. All of her questions were answered she is agreeable to proceed. She was added on for tomorrow schedule.  Waverly Ferrarihristopher Dickson, MD, FACS Beeper [phone] 5:00 PM  Reason for Consult: ESRD Referring Physician: Garnetta BuddyMartin W Webb, MD  History of Present Illness: 53 y/o female who is stage 4 renal failure and needs permanent access.  [place].  Her past medical history includes:   type 1 diabetes, hypertension, and hyperlipidemia.  She takes an Asprin 81 mg daily, gemfibrozil for cholesterol and lasix for hypertension.  Pt meds include: ASA: Yes Other anticoagulants/antiplatelets: None  Past Medical History  Diagnosis Date  . Hypertension   . Diabetes mellitus without complication     type 82 since 53 years old but reports starting insulin at [age] years old  . Depression   . High cholesterol   . Obesity   . Macular degeneration disease     History reviewed. No pertinent past surgical history.  Social History History  Substance Use Topics  . Smoking status: Never Smoker   . Smokeless tobacco: Not on file  . Alcohol Use: No    Family History Family History  Problem Relation Age of Onset  . Hypertension Father   . Diabetes Father   . Kidney failure Father   . Kidney failure Sister     Allergies  Allergen Reactions  . Hydromorphone Hcl Other (See Comments)    Doesn't remember    REVIEW OF SYSTEMS  General: [ ]  Weight loss, [ ]  Fever, [ ]  chills [x]  wekness Neurologic: [ ]  Dizziness, [ ]  Blackouts, [ ]  Seizure [ ]  Stroke, [ ]  "Mini stroke", [ ]  Slurred speech, [ ]  Temporary blindness; [ ]  weakness in arms or legs, [ ]  Hoarseness [ ]  Dysphagia Cardiac: [ ]  Chest pain/pressure, [x ] Shortness of breath at rest [ ]  Shortness of breath with exertion, [ ]  Atrial fibrillation or irregular heartbeat  Vascular: [ ]  Pain in legs with walking, [ ]  Pain in legs at rest, [ ]  Pain in legs at night,  [ ]  Non-healing ulcer, [ ]  Blood clot in vein/DVT,   Pulmonary: [ ]  Home oxygen, [ ]  Productive cough, [ ]  Coughing up blood, [ ]  Asthma,  [ ]  Wheezing [ ]  COPD Musculoskeletal:  [ ]  Arthritis, [ ]  Low back pain, [ ]  Joint pain Hematologic: [ ]  Easy Bruising, [ ]  Anemia; [ ]  Hepatitis Gastrointestinal: [ ]  Blood in stool, [ ]  Gastroesophageal Reflux/heartburn, Urinary: [x ] chronic Kidney disease, [ ]  on HD - [ ]  MWF or [ ]  TTHS, [ ]  Burning with urination, [ ]  Difficulty urinating Skin: [ ]  Rashes, [ ]  Wounds Psychological: [ ]  Anxiety, [ ]  Depression  Physical Examination Filed Vitals:   12/12/13 1838 12/12/13 2100 12/13/13 0541 12/13/13 1132  BP: 157/77 129/85 141/73 160/80  Pulse: 89 98 85 94  Temp: 97.7 F (36.5 C) 98.3 F (36.8 C) 98.2 F (36.8  C)   TempSrc: Oral Oral Oral   Resp:  18 17   Height: 5\' 1"  (1.549 m)     Weight: 207 lb 14.3 oz (94.3 kg)  206 lb 12.7 oz (93.8 kg)   SpO2: 97% 97% 97%    Body mass index is 39.09 kg/(m^2).  General:  WDWN in NAD Gait: Normal HENT: WNL Eyes: Pupils equal Pulmonary: normal non-labored breathing , without Rales, rhonchi,  wheezing Cardiac: RRR, without  Murmurs, rubs or gallops; No carotid bruits Abdomen: soft, NT, no masses Skin: no rashes, ulcers noted;  no Gangrene , no cellulitis; no open wounds;  Vascular Exam/Pulses:palpable radial and brachial pulses intact 2+ equally Musculoskeletal: no muscle wasting or  atrophy; no edema  Neurologic: A&O X 3; Appropriate Affect ;  SENSATION: normal; MOTOR FUNCTION: 5/5 Symmetric Speech is fluent/normal  Significant Diagnostic Studies: CBC Lab Results  Component Value Date   WBC 8.9 12/12/2013   HGB 9.8* 12/12/2013   HCT 29.0* 12/12/2013   MCV 86.1 12/12/2013   PLT 252 12/12/2013   BMET    Component Value Date/Time   NA 141 12/13/2013 0311   K 3.3* 12/13/2013 0311   CL 99 12/13/2013 0311   CO2 26 12/13/2013 0311   GLUCOSE 115* 12/13/2013 0311   BUN 85* 12/13/2013 0311   CREATININE 6.40* 12/13/2013 0311   CALCIUM 7.2* 12/13/2013 0311   GFRNONAA 7* 12/13/2013 0311   GFRAA 8* 12/13/2013 0311   Estimated Creatinine Clearance: 10.9 ml/min (by C-G formula based on Cr of 6.4).  COAG No results found for this basename: INR, PROTIME   Non-Invasive Vascular Imaging: pending vein mapping  I have independently interpreted her vein mapping which shows that she has a reasonable left upper arm cephalic vein.  Waverly Ferrarihristopher Dickson, MD, FACS  ASSESSMENT/PLAN:    [name], [name] 12/13/2013 3:20 PM

## 2013-12-14 NOTE — Plan of Care (Signed)
Problem: Phase I Progression Outcomes Goal: EF % per last Echo/documented,Core Reminder form on chart Outcome: Completed/Met Date Met:  12/14/13 EF40-45%(12-13-13)

## 2013-12-14 NOTE — Progress Notes (Signed)
Called to room per bed placement for new Step Down order, asked to assess pt. Pt originally admitted for chest pain and SOB. Hx includes HTN, CHF, Acute on Chronic renal failure.  Pt with Pt developed chest pain and respiratory distress at 1845. Resident Dr. Glenard HaringModing paged and upadated per day RN at 1900 and orders received for Bipap and transfer. Day shift RN consulted. Per RN pt complained of SOB and chest pain at 1845. NTG X2 given, Normal EKG resulting and chest pain resolved, last 2 troponin labs negative. 02 need increased from 3L to 5 LNC. Upon arrival pt found resting in bed on 5 LNC 02 sats 91%. Lungs clear diminished bases. Pt non english speaking and her daughter at bedside, appropriately translating for pt. Pt alert oriented, denies chest pain, complains of mild SOB. ABG and CXR orders placed per protocol and Resident paged to update on my assessment. Troponin lab ordered stat. Pt placed on NRB for extra 02 support while ABG pending. ABG done and labs returned WNL except high 02. Pt weaned down to VM 50% per bedside RN at 2100. CXR showing worsening pulmonary edema, lasix ordered and given.   Follow up visit done at 2300. .Pt 02 weaned to 5 LNC at. Found sleeping with continuous pulse ox reading 98%. Bedside RN to monitor closely. Transfer on hold for now.

## 2013-12-14 NOTE — Anesthesia Postprocedure Evaluation (Signed)
  Anesthesia Post-op Note  Patient: Joan Young  Procedure(s) Performed: Procedure(s): Cancelled  ARTERIOVENOUS  FISTULA CREATION-LEFT Arm BRACHIOCEPHALIC (Left)  Patient Location: PACU  Anesthesia Type:MAC  Level of Consciousness: awake  Airway and Oxygen Therapy: Patient Spontanous Breathing  Post-op Pain: mild  Post-op Assessment: Post-op Vital signs reviewed  Post-op Vital Signs: Reviewed  Last Vitals:  Filed Vitals:   12/14/13 1557  BP: 153/68  Pulse: 94  Temp: 36.8 C  Resp: 18    Complications: No apparent anesthesia complications/case cancelled as per Dr. Myra GianottiBrabham.

## 2013-12-14 NOTE — Progress Notes (Signed)
Subjective: Daughter is with her at bedside. SOB intermittent and currently on 4L, down from 5L overnight.   Objective: Vital signs in last 24 hours: Filed Vitals:   12/13/13 2100 12/13/13 2300 12/14/13 0149 12/14/13 0531  BP: 158/88  119/73 134/70  Pulse: 92  77 85  Temp:   97.4 F (36.3 C) 97.5 F (36.4 C)  TempSrc: Oral  Oral Oral  Resp: 20 18 18 18   Height:      Weight:    206 lb 9.6 oz (93.713 kg)  SpO2: 99% 98% 95% 96%   Weight change: -1 lb 4.7 oz (-0.587 kg)  Intake/Output Summary (Last 24 hours) at 12/14/13 0856 Last data filed at 12/14/13 0531  Gross per 24 hour  Intake   1024 ml  Output   1325 ml  Net   -301 ml   Vitals reviewed. General: resting in bed, NAD HEENT: PERRL, EOMI, no scleral icterus Cardiac: RRR, no rubs, murmurs or gallops Pulm: bibasilar crackles R > L Abd: soft, nontender, nondistended, BS present Ext: warm and well perfused, 1+ BLE edema Neuro: alert and oriented X3, cranial nerves II-XII grossly intact, strength and sensation to light touch equal in bilateral upper and lower extremities  Lab Results: Basic Metabolic Panel:  Recent Labs Lab 12/13/13 0311 12/14/13 0400  NA 141 137  K 3.3* 4.0  CL 99 95*  CO2 26 23  GLUCOSE 115* 131*  BUN 85* 83*  CREATININE 6.40* 6.34*  CALCIUM 7.2* 7.7*   CBC:  Recent Labs Lab 12/12/13 1351 12/14/13 0400  WBC 8.9 5.4  NEUTROABS 6.7  --   HGB 9.8* 8.4*  HCT 29.0* 25.1*  MCV 86.1 84.5  PLT 252 221   CBG:  Recent Labs Lab 12/12/13 2146 12/13/13 0620 12/13/13 1123 12/13/13 1637 12/13/13 2245 12/14/13 0546  GLUCAP 138* 100* 111* 149* 88 120*   EKG: Reviewed and compared with 12/12/13 Normal sinus rhythm Tachycardic: HR 97 QT/QTc 400/508, slower from 416/531  Studies/Results: Dg Chest 2 View  12/12/2013   CLINICAL DATA:  53 year old female acute chest pain shortness of breath weakness headache. Initial encounter.  EXAM: CHEST  2 VIEW  COMPARISON:  10/15/2004 and earlier.   FINDINGS: Diffuse increased interstitial markings. Small pleural effusions and fluid in the fissures. Indistinctness of pulmonary vasculature. Cardiac size is mildly increased, but is at the upper limits of normal. Other mediastinal contours are within normal limits. Visualized tracheal air column is within normal limits. No pneumothorax. No acute osseous abnormality identified.  IMPRESSION: Acute pulmonary edema with small bilateral pleural effusions.   Electronically Signed   By: Augusto GambleLee  Hall M.D.   On: 12/12/2013 14:52   [place] Renal  12/13/2013   CLINICAL DATA:  Renal failure.  Initial encounter.  EXAM: RENAL/URINARY TRACT ULTRASOUND COMPLETE  COMPARISON:  None.  FINDINGS: Right Kidney:  Length: 11 cm. Echogenicity within normal limits. No mass or hydronephrosis visualized.  Left Kidney:  Length: 11 cm. Echogenicity within normal limits. No mass or hydronephrosis visualized.  Bladder:  Appears normal for degree of bladder distention.  Incidental small right pleural effusion.  IMPRESSION: 1. Negative renal ultrasound. 2. Small right pleural effusion.   Electronically Signed   By: Tiburcio PeaJonathan  Watts M.D.   On: 12/13/2013 06:54   Dg Chest Port 1 View  12/13/2013   CLINICAL DATA:  Shortness of breath.  Initial encounter.  EXAM: PORTABLE CHEST - 1 VIEW  COMPARISON:  12/12/2013; 10/15/2004  FINDINGS: Grossly unchanged enlarged cardiac silhouette and mediastinal  contours with atherosclerotic plaque within the thoracic aorta. Pulmonary vasculature is less distinct with cephalization of flow. Interval increase in size of small bilateral effusions and associated worsening bibasilar heterogeneous/consolidative opacities, left greater than right. No pneumothorax. Unchanged bones.  IMPRESSION: Findings compatible with worsening pulmonary edema, small bilateral effusions with associated bibasilar opacities, left greater than right, atelectasis versus infiltrate.   Electronically Signed   By: Simonne ComeJohn  Watts M.D.   On: 12/13/2013  20:58   Medications: I have reviewed the patient's current medications. Scheduled Meds: . aspirin EC  81 mg Oral Daily  .  ceFAZolin (ANCEF) IV  2 g Intravenous On Call  . FLUoxetine  20 mg Oral Daily  . furosemide  120 mg Intravenous 3 times per day  . gabapentin  300 mg Oral TID  . gemfibrozil  600 mg Oral BID AC  . heparin  5,000 Units Subcutaneous 3 times per day  . insulin aspart  0-5 Units Subcutaneous QHS  . insulin aspart  0-9 Units Subcutaneous TID WC  . insulin aspart  5 Units Subcutaneous TID WC  . insulin glargine  30 Units Subcutaneous QHS  . isosorbide dinitrate  10 mg Oral BID  . methyldopa  250 mg Oral Daily  . pantoprazole  40 mg Oral Daily  . verapamil  80 mg Oral BID   Continuous Infusions:  PRN Meds:.acetaminophen, acetaminophen, polyethylene glycol Assessment/Plan: [name] is a 53 year old [place] female with DM, HTN, combined systolic and diastolic dysfunction CHF, CKD IV,hospitalized for acute respiratory distress of multifactorial etiology.   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 GFR stable at 7, baseline 25 in [place]. EF 40-45% here without prior for comparison. -Nephrology consulted and following -Continue Lasix 120mg  IV TID  -Continue strict I/O, daily weights, continous pulse ox, telemetry  -Continue O2; wean as tolerated -UPEP/SPEP & PTH pending -Vascular surgery consulted for HD access.  Metabolic anion gap acidosis: AG 19, up from 16 yesterday. Likely 2/2 uremia.   Anemia of CKD: Fe 28, TIBC 220.  -Defer to nephrology for further management  HTN: BP 130-160/60-80.  -Holding irbesartan in setting of AKI  -Continue to taper off methyldopa given contraindications in CHF & renal disease -Continue isosorbide dinitrate and verapamil per home regimen   DM: She reports Type 2 though likely Type 1  given that she was diagnosed in childhood. CGGs trending 90-130. -Continue 30u Lantus + 5u meal time.   Depression: Continue prozac  Atypical chest pain: Resolved.  Dispo: Disposition is deferred at this time, awaiting improvement of current medical problems.  Anticipated discharge in approximately 2-3 day(s).   The patient does not have a current PCP (No Pcp Per Patient) and [facility] after discharge.  The patient does have transportation limitations that hinder transportation to clinic appointments.  .Services Needed at time of discharge: Y = Yes, Blank = No PT:   OT:   RN:   Equipment:   Other:     LOS: 2 days   Heywood Ilesushil Canesha Tesfaye, MD 12/14/2013, 8:56 AM

## 2013-12-14 NOTE — Anesthesia Preprocedure Evaluation (Addendum)
Anesthesia Evaluation  Patient identified by MRN, date of birth, ID band Patient awake    Reviewed: Allergy & Precautions, H&P , NPO status , Patient's Chart, lab work & pertinent test results, reviewed documented beta blocker date and time   Airway Mallampati: II TM Distance: >3 FB     Dental  (+) Teeth Intact, Dental Advisory Given   Pulmonary    + decreased breath sounds  rales    Cardiovascular hypertension, +CHF Rhythm:Regular     Neuro/Psych    GI/Hepatic negative GI ROS, Neg liver ROS,   Endo/Other  negative endocrine ROSdiabetes  Renal/GU ESRFRenal disease     Musculoskeletal negative musculoskeletal ROS (+)   Abdominal   Peds  Hematology negative hematology ROS (+) anemia ,   Anesthesia Other Findings   Reproductive/Obstetrics negative OB ROS                          Anesthesia Physical Anesthesia Plan  ASA: III  Anesthesia Plan: MAC   Post-op Pain Management:    Induction:   Airway Management Planned: Simple Face Mask  Additional Equipment:   Intra-op Plan:   Post-operative Plan:   Informed Consent: I have reviewed the patients History and Physical, chart, labs and discussed the procedure including the risks, benefits and alternatives for the proposed anesthesia with the patient or authorized representative who has indicated his/her understanding and acceptance.   Dental advisory given  Plan Discussed with: CRNA, Anesthesiologist and Surgeon  Anesthesia Plan Comments:         Anesthesia Quick Evaluation

## 2013-12-14 NOTE — Progress Notes (Signed)
One ring given to patient's daughter, Joan Young.

## 2013-12-15 DIAGNOSIS — N2581 Secondary hyperparathyroidism of renal origin: Secondary | ICD-10-CM | POA: Diagnosis present

## 2013-12-15 LAB — BASIC METABOLIC PANEL
Anion gap: 15 (ref 5–15)
BUN: 79 mg/dL — ABNORMAL HIGH (ref 6–23)
CO2: 26 mEq/L (ref 19–32)
Calcium: 7.8 mg/dL — ABNORMAL LOW (ref 8.4–10.5)
Chloride: 96 mEq/L (ref 96–112)
Creatinine, Ser: 6.44 mg/dL — ABNORMAL HIGH (ref 0.50–1.10)
GFR calc Af Amer: 8 mL/min — ABNORMAL LOW (ref >90)
GFR, EST NON AFRICAN AMERICAN: 7 mL/min — AB (ref >90)
Glucose, Bld: 177 mg/dL — ABNORMAL HIGH (ref 70–99)
Potassium: 3.7 mEq/L (ref 3.7–5.3)
SODIUM: 137 meq/L (ref 137–147)

## 2013-12-15 LAB — CBC
HCT: 24.7 % — ABNORMAL LOW (ref 36.0–46.0)
Hemoglobin: 8.4 g/dL — ABNORMAL LOW (ref 12.0–15.0)
MCH: 28.8 pg (ref 26.0–34.0)
MCHC: 34 g/dL (ref 30.0–36.0)
MCV: 84.6 fL (ref 78.0–100.0)
PLATELETS: 231 10*3/uL (ref 150–400)
RBC: 2.92 MIL/uL — ABNORMAL LOW (ref 3.87–5.11)
RDW: 13.5 % (ref 11.5–15.5)
WBC: 4.9 10*3/uL (ref 4.0–10.5)

## 2013-12-15 LAB — GLUCOSE, CAPILLARY
Glucose-Capillary: 154 mg/dL — ABNORMAL HIGH (ref 70–99)
Glucose-Capillary: 259 mg/dL — ABNORMAL HIGH (ref 70–99)
Glucose-Capillary: 59 mg/dL — ABNORMAL LOW (ref 70–99)
Glucose-Capillary: 82 mg/dL (ref 70–99)
Glucose-Capillary: 301 mg/dL — ABNORMAL HIGH (ref 70–99)

## 2013-12-15 LAB — PHOSPHORUS: Phosphorus: 6.7 mg/dL — ABNORMAL HIGH (ref 2.3–4.6)

## 2013-12-15 MED ORDER — FUROSEMIDE 10 MG/ML IJ SOLN
160.0000 mg | Freq: Three times a day (TID) | INTRAVENOUS | Status: DC
  Administered 2013-12-15 – 2013-12-17 (×7): 160 mg via INTRAVENOUS
  Filled 2013-12-15 (×8): qty 16

## 2013-12-15 MED ORDER — CALCIUM ACETATE 667 MG PO CAPS
667.0000 mg | ORAL_CAPSULE | Freq: Three times a day (TID) | ORAL | Status: DC
  Administered 2013-12-15 – 2013-12-20 (×12): 667 mg via ORAL
  Filled 2013-12-15 (×17): qty 1

## 2013-12-15 MED ORDER — SODIUM CHLORIDE 0.9 % IV SOLN
1020.0000 mg | Freq: Once | INTRAVENOUS | Status: AC
  Administered 2013-12-15: 1020 mg via INTRAVENOUS
  Filled 2013-12-15: qty 34

## 2013-12-15 MED ORDER — CALCITRIOL 0.25 MCG PO CAPS
0.2500 ug | ORAL_CAPSULE | Freq: Every day | ORAL | Status: DC
  Administered 2013-12-15 – 2013-12-20 (×5): 0.25 ug via ORAL
  Filled 2013-12-15 (×6): qty 1

## 2013-12-15 MED ORDER — METOLAZONE 2.5 MG PO TABS
2.5000 mg | ORAL_TABLET | ORAL | Status: DC
  Administered 2013-12-15 – 2013-12-18 (×4): 2.5 mg via ORAL
  Filled 2013-12-15 (×5): qty 1

## 2013-12-15 MED ORDER — ATORVASTATIN CALCIUM 40 MG PO TABS
40.0000 mg | ORAL_TABLET | Freq: Every day | ORAL | Status: DC
  Administered 2013-12-15 – 2013-12-19 (×5): 40 mg via ORAL
  Filled 2013-12-15 (×6): qty 1

## 2013-12-15 NOTE — Progress Notes (Signed)
CBG 59. Pt c/o not feeling well. OJ given. IMed MD made aware

## 2013-12-15 NOTE — Progress Notes (Addendum)
Patient ID: Joan Young, female   DOB: 09/16/1960, 53 y.o.   MRN: [id number] S:events that prevented AVF creation noted despite improved CXR.  Pt feels better this am and is able to lie flat.   O:BP 122/82  Pulse 96  Temp(Src) 98.2 F (36.8 C) (Oral)  Resp 18  Ht 5\' 1"  (1.549 m)  Wt 92.4 kg (203 lb 11.3 oz)  BMI 38.51 kg/m2  SpO2 96%  Intake/Output Summary (Last 24 hours) at 12/15/13 0907 Last data filed at 12/15/13 0817  Gross per 24 hour  Intake    700 ml  Output    725 ml  Net    -25 ml   Intake/Output: I/O last 3 completed shifts: In: 704 [P.O.:480; I.V.:100; IV Piggyback:124] Out: 1475 [Urine:1475]  Intake/Output this shift:  Total I/O In: 240 [P.O.:240] Out: -  Weight change: -1.313 kg (-2 lb 14.3 oz) Gen:WD WN obese HF in NAd CVS:no rub Resp:decreased BS at bases but otherwise CTA WUJ:WJXBJAbd:obese +BS Ext:2+ pitting edema lower ext   Recent Labs Lab 12/12/13 1351 12/13/13 0311 12/14/13 0400 12/15/13 0452  NA 140 141 137 137  K 3.6* 3.3* 4.0 3.7  CL 95* 99 95* 96  CO2 26 26 23 26   GLUCOSE 163* 115* 131* 177*  BUN 85* 85* 83* 79*  CREATININE 6.23* 6.40* 6.34* 6.44*  ALBUMIN 2.4*  --   --   --   CALCIUM 7.3* 7.2* 7.7* 7.8*  AST 21  --   --   --   ALT 13  --   --   --    Liver Function Tests:  Recent Labs Lab 12/12/13 1351  AST 21  ALT 13  ALKPHOS 178*  BILITOT <0.2*  PROT 6.5  ALBUMIN 2.4*    Recent Labs Lab 12/12/13 1351  LIPASE 81*   No results found for this basename: AMMONIA,  in the last 168 hours CBC:  Recent Labs Lab 12/12/13 1351 12/14/13 0400 12/15/13 0452  WBC 8.9 5.4 4.9  NEUTROABS 6.7  --   --   HGB 9.8* 8.4* 8.4*  HCT 29.0* 25.1* 24.7*  MCV 86.1 84.5 84.6  PLT 252 221 231   Cardiac Enzymes:  Recent Labs Lab 12/12/13 1954 12/13/13 0311 12/13/13 2130  TROPONINI <0.30 <0.30 <0.30   CBG:  Recent Labs Lab 12/14/13 1109 12/14/13 1447 12/14/13 1640 12/14/13 2122 12/15/13 0623  GLUCAP 113* 115* 110*  198* 154*    Iron Studies:  Recent Labs  12/13/13 1634  IRON 28*  TIBC 220*   Studies/Results: Dg Chest Port 1 View  12/14/2013   CLINICAL DATA:  Shortness of breath for 1 day.  EXAM: PORTABLE CHEST - 1 VIEW  COMPARISON:  Single view of the chest 12/13/2013.  FINDINGS: Interstitial pulmonary edema seen on the comparison study has markedly improved. There are small to moderate pleural effusions and basilar atelectasis, worse on the left. Cardiomegaly is noted.  IMPRESSION: Marked improvement in pulmonary edema with residual small to moderate effusions and basilar atelectasis, worse on the left.   Electronically Signed   By: Drusilla Kannerhomas  Dalessio M.D.   On: 12/14/2013 15:19   Dg Chest Port 1 View  12/13/2013   CLINICAL DATA:  Shortness of breath.  Initial encounter.  EXAM: PORTABLE CHEST - 1 VIEW  COMPARISON:  12/12/2013; 10/15/2004  FINDINGS: Grossly unchanged enlarged cardiac silhouette and mediastinal contours with atherosclerotic plaque within the thoracic aorta. Pulmonary vasculature is less distinct with cephalization of flow. Interval increase in size of  small bilateral effusions and associated worsening bibasilar heterogeneous/consolidative opacities, left greater than right. No pneumothorax. Unchanged bones.  IMPRESSION: Findings compatible with worsening pulmonary edema, small bilateral effusions with associated bibasilar opacities, left greater than right, atelectasis versus infiltrate.   Electronically Signed   By: Simonne ComeJohn  Watts M.D.   On: 12/13/2013 20:58   . aspirin EC  81 mg Oral Daily  . FLUoxetine  20 mg Oral Daily  . furosemide  120 mg Intravenous 3 times per day  . gabapentin  300 mg Oral TID  . gemfibrozil  600 mg Oral BID AC  . heparin  5,000 Units Subcutaneous 3 times per day  . hydrALAZINE  10 mg Oral 3 times per day  . insulin aspart  0-5 Units Subcutaneous QHS  . insulin aspart  0-9 Units Subcutaneous TID WC  . insulin aspart  5 Units Subcutaneous TID WC  . insulin  glargine  30 Units Subcutaneous QHS  . isosorbide dinitrate  10 mg Oral BID  . methyldopa  250 mg Oral Daily  . pantoprazole  40 mg Oral Daily    BMET    Component Value Date/Time   NA 137 12/15/2013 0452   K 3.7 12/15/2013 0452   CL 96 12/15/2013 0452   CO2 26 12/15/2013 0452   GLUCOSE 177* 12/15/2013 0452   BUN 79* 12/15/2013 0452   CREATININE 6.44* 12/15/2013 0452   CALCIUM 7.8* 12/15/2013 0452   GFRNONAA 7* 12/15/2013 0452   GFRAA 8* 12/15/2013 0452   CBC    Component Value Date/Time   WBC 4.9 12/15/2013 0452   RBC 2.92* 12/15/2013 0452   HGB 8.4* 12/15/2013 0452   HCT 24.7* 12/15/2013 0452   PLT 231 12/15/2013 0452   MCV 84.6 12/15/2013 0452   MCH 28.8 12/15/2013 0452   MCHC 34.0 12/15/2013 0452   RDW 13.5 12/15/2013 0452   LYMPHSABS 1.4 12/12/2013 1351   MONOABS 0.5 12/12/2013 1351   EOSABS 0.3 12/12/2013 1351   BASOSABS 0.0 12/12/2013 1351     Assessment/Plan:  1. AKI/CKD vs. Progressive CKD stage 4 related to DM/HTN and CHF- daughter reports that her mother's physicians in [place] told her that she had 25% kidney function 5 months ago.  Unclear if this is AKI/CKD related to decompensated CHF or just progressive CKD.  Also, per her report, the only RRT option for her in [place] is peritoneal dialysis not hemodialysis.  Will need to establish if she will be able to remain in the [place] or if she has to go back to [place] as this will affect her vascular access options. 1. For now goal is to improve CHF and hopefully improve cardiorenal syndrome 2. CHF acute on chronic- not having significant diuresis 1. REcommend to Increase lasix to 160mg  q8 2. Add metolazone 2.5mg  prior to next lasix dose and follow 3. Anemia of chronic disease- SPEP/UPEP pending, will likely require epo +/- IV iron 1. Iron deficient with Tsat of <20% recommend IV ferraheme 1020mg  x 1 dose if ok with primary svc 4. HTN- variable 5. Vascular access- appreciate VVS attempts.  Will try again  next week especially if she will be remaining in the [place]. 6. Bone and mineral metabolism- iPTH elevated at 521,  1. Recommend to start calcitriol 0.6825mcg daily 7. DM- per primary svc 8. Dispo- daughter is trying to investigate ways to keep her mother her in the [place] for care.  Eowyn Tabone A

## 2013-12-15 NOTE — Progress Notes (Signed)
Patient c/o mild headache. Medicated with 650 mg of Tylenol po as per MD prn order. Patient is Sinus tach with heart rate of 102 on th monitor. Will continue to monitor.  Sharlene Doryanya Sherice Ijames, RN

## 2013-12-15 NOTE — Progress Notes (Signed)
C/o feeling SOB while lying down. Lungs clear bur diminished-O2 sats 96%

## 2013-12-15 NOTE — Progress Notes (Signed)
Subjective: Unable to tolerate AVF creating yesterday with Dr. Myra Gianotti as could not lay flat.  Reports SOB and orthopnea improved this morning some. BP under better control this AM. Objective: Vital signs in last 24 hours: Filed Vitals:   12/14/13 1557 12/14/13 1816 12/14/13 2040 12/15/13 0632  BP: 153/68 163/80 136/84 122/82  Pulse: 94 98 98 96  Temp: 98.2 F (36.8 C)  98.1 F (36.7 C) 98.2 F (36.8 C)  TempSrc: Oral  Oral Oral  Resp: 18  17 18   Height:      Weight:    203 lb 11.3 oz (92.4 kg)  SpO2: 99%  96% 96%   Weight change: -2 lb 14.3 oz (-1.313 kg)  Intake/Output Summary (Last 24 hours) at 12/15/13 0915 Last data filed at 12/15/13 0817  Gross per 24 hour  Intake    700 ml  Output    725 ml  Net    -25 ml   General: resting in bed in NAD HEENT: no scleral icterus Cardiac: RRR, no rubs, murmurs or gallops Pulm: decreased bibasilar breath sounds Abd: soft, nontender, nondistended, BS present Ext: warm and well perfused, 2+ pedal edema bilaterally Neuro:AAOx4  Lab Results: Basic Metabolic Panel:  Recent Labs Lab 12/14/13 0400 12/15/13 0452  NA 137 137  K 4.0 3.7  CL 95* 96  CO2 23 26  GLUCOSE 131* 177*  BUN 83* 79*  CREATININE 6.34* 6.44*  CALCIUM 7.7* 7.8*   Liver Function Tests:  Recent Labs Lab 12/12/13 1351  AST 21  ALT 13  ALKPHOS 178*  BILITOT <0.2*  PROT 6.5  ALBUMIN 2.4*    Recent Labs Lab 12/12/13 1351  LIPASE 81*   No results found for this basename: AMMONIA,  in the last 168 hours CBC:  Recent Labs Lab 12/12/13 1351 12/14/13 0400 12/15/13 0452  WBC 8.9 5.4 4.9  NEUTROABS 6.7  --   --   HGB 9.8* 8.4* 8.4*  HCT 29.0* 25.1* 24.7*  MCV 86.1 84.5 84.6  PLT 252 221 231   Cardiac Enzymes:  Recent Labs Lab 12/12/13 1954 12/13/13 0311 12/13/13 2130  TROPONINI <0.30 <0.30 <0.30   BNP:  Recent Labs Lab 12/12/13 1351  PROBNP 12460.0*   D-Dimer: No results found for this basename: DDIMER,  in the last 168  hours CBG:  Recent Labs Lab 12/14/13 0546 12/14/13 1109 12/14/13 1447 12/14/13 1640 12/14/13 2122 12/15/13 0623  GLUCAP 120* 113* 115* 110* 198* 154*   Hemoglobin A1C:  Recent Labs Lab 12/12/13 1954  HGBA1C 8.3*   Fasting Lipid Panel: No results found for this basename: CHOL, HDL, LDLCALC, TRIG, CHOLHDL, LDLDIRECT,  in the last 168 hours Thyroid Function Tests: No results found for this basename: TSH, T4TOTAL, FREET4, T3FREE, THYROIDAB,  in the last 168 hours Coagulation: No results found for this basename: LABPROT, INR,  in the last 168 hours Anemia Panel:  Recent Labs Lab 12/13/13 1634  TIBC 220*  IRON 28*   Urine Drug Screen: Drugs of Abuse  No results found for this basename: labopia, cocainscrnur, labbenz, amphetmu, thcu, labbarb    Alcohol Level: No results found for this basename: ETH,  in the last 168 hours Urinalysis:  Recent Labs Lab 12/12/13 1754  COLORURINE YELLOW  LABSPEC 1.013  PHURINE 6.5  GLUCOSEU 100*  HGBUR SMALL*  BILIRUBINUR NEGATIVE  KETONESUR NEGATIVE  PROTEINUR >300*  UROBILINOGEN 0.2  NITRITE NEGATIVE  LEUKOCYTESUR NEGATIVE   Micro Results: No results found for this or any previous visit (from  the past 240 hour(s)). Studies/Results: Dg Chest Port 1 View  12/14/2013   CLINICAL DATA:  Shortness of breath for 1 day.  EXAM: PORTABLE CHEST - 1 VIEW  COMPARISON:  Single view of the chest 12/13/2013.  FINDINGS: Interstitial pulmonary edema seen on the comparison study has markedly improved. There are small to moderate pleural effusions and basilar atelectasis, worse on the left. Cardiomegaly is noted.  IMPRESSION: Marked improvement in pulmonary edema with residual small to moderate effusions and basilar atelectasis, worse on the left.   Electronically Signed   By: Drusilla Kannerhomas  Dalessio M.D.   On: 12/14/2013 15:19   Dg Chest Port 1 View  12/13/2013   CLINICAL DATA:  Shortness of breath.  Initial encounter.  EXAM: PORTABLE CHEST - 1 VIEW   COMPARISON:  12/12/2013; 10/15/2004  FINDINGS: Grossly unchanged enlarged cardiac silhouette and mediastinal contours with atherosclerotic plaque within the thoracic aorta. Pulmonary vasculature is less distinct with cephalization of flow. Interval increase in size of small bilateral effusions and associated worsening bibasilar heterogeneous/consolidative opacities, left greater than right. No pneumothorax. Unchanged bones.  IMPRESSION: Findings compatible with worsening pulmonary edema, small bilateral effusions with associated bibasilar opacities, left greater than right, atelectasis versus infiltrate.   Electronically Signed   By: Simonne ComeJohn  Watts M.D.   On: 12/13/2013 20:58   Medications: I have reviewed the patient's current medications. Scheduled Meds: . aspirin EC  81 mg Oral Daily  . FLUoxetine  20 mg Oral Daily  . furosemide  120 mg Intravenous 3 times per day  . gabapentin  300 mg Oral TID  . gemfibrozil  600 mg Oral BID AC  . heparin  5,000 Units Subcutaneous 3 times per day  . hydrALAZINE  10 mg Oral 3 times per day  . insulin aspart  0-5 Units Subcutaneous QHS  . insulin aspart  0-9 Units Subcutaneous TID WC  . insulin aspart  5 Units Subcutaneous TID WC  . insulin glargine  30 Units Subcutaneous QHS  . isosorbide dinitrate  10 mg Oral BID  . methyldopa  250 mg Oral Daily  . pantoprazole  40 mg Oral Daily   Continuous Infusions: . sodium chloride 10 mL/hr at 12/14/13 1307   PRN Meds:.acetaminophen, acetaminophen, polyethylene glycol Assessment/Plan:   Acute on chronic combined systolic and diastolic congestive heart failure -BP improved this AM with addition of hydralazine.  She is still minimally responsive to IV Lasix 120mg  TID.  Her weight is down 3 lbs since admission and net negative 1L for stay.  She was unable to lay flat for AVF creation yesterday. - Continue daily weights - Monitor I&O - Increase IV Lasix 160mg  TID - Add metolazone 2.5mg     Acute renal failure  superimposed on stage 4 chronic kidney disease - Nephrology following, appreciated recommendations - Will likely need Dialysis soon, no uremic symptoms but still volume overloaded. - SCr remaing stable - Need access, will likely retry next week.    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 -Continue Isosorbide dinite - Will hold Methyldopa today (may need to give one dose of 250 tomorrow for weaning) - Once Acute CHF resolved will need BB.    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   The patient does have a current PCP (No Pcp Per Patient) and does not need an [facility] follow-up appointment after discharge.  The patient does not have transportation limitations that hinder transportation to clinic appointments.  .Services Needed at time of discharge: Y = Yes, Blank = No PT:   OT:   RN:   Equipment:   Other:     LOS: 3 days   Gust RungErik C Arnelle Nale, DO 12/15/2013, 9:15 AM

## 2013-12-16 LAB — CBC
HCT: 25.3 % — ABNORMAL LOW (ref 36.0–46.0)
Hemoglobin: 8.6 g/dL — ABNORMAL LOW (ref 12.0–15.0)
MCH: 28.7 pg (ref 26.0–34.0)
MCHC: 34 g/dL (ref 30.0–36.0)
MCV: 84.3 fL (ref 78.0–100.0)
PLATELETS: 231 10*3/uL (ref 150–400)
RBC: 3 MIL/uL — AB (ref 3.87–5.11)
RDW: 13.4 % (ref 11.5–15.5)
WBC: 5.3 10*3/uL (ref 4.0–10.5)

## 2013-12-16 LAB — BASIC METABOLIC PANEL
Anion gap: 14 (ref 5–15)
BUN: 80 mg/dL — AB (ref 6–23)
CALCIUM: 7.9 mg/dL — AB (ref 8.4–10.5)
CO2: 29 mEq/L (ref 19–32)
Chloride: 97 mEq/L (ref 96–112)
Creatinine, Ser: 6.35 mg/dL — ABNORMAL HIGH (ref 0.50–1.10)
GFR, EST AFRICAN AMERICAN: 8 mL/min — AB (ref >90)
GFR, EST NON AFRICAN AMERICAN: 7 mL/min — AB (ref >90)
Glucose, Bld: 176 mg/dL — ABNORMAL HIGH (ref 70–99)
Potassium: 3.8 mEq/L (ref 3.7–5.3)
SODIUM: 140 meq/L (ref 137–147)

## 2013-12-16 LAB — GLUCOSE, CAPILLARY
GLUCOSE-CAPILLARY: 191 mg/dL — AB (ref 70–99)
GLUCOSE-CAPILLARY: 176 mg/dL — AB (ref 70–99)
Glucose-Capillary: 163 mg/dL — ABNORMAL HIGH (ref 70–99)
Glucose-Capillary: 192 mg/dL — ABNORMAL HIGH (ref 70–99)

## 2013-12-16 MED ORDER — GABAPENTIN 100 MG PO CAPS
200.0000 mg | ORAL_CAPSULE | Freq: Every day | ORAL | Status: DC
  Administered 2013-12-16 – 2013-12-20 (×4): 200 mg via ORAL
  Filled 2013-12-16 (×5): qty 2

## 2013-12-16 MED ORDER — DARBEPOETIN ALFA-POLYSORBATE 40 MCG/0.4ML IJ SOLN
40.0000 ug | INTRAMUSCULAR | Status: DC
  Administered 2013-12-16: 40 ug via SUBCUTANEOUS
  Filled 2013-12-16: qty 0.4

## 2013-12-16 NOTE — Progress Notes (Signed)
Subjective: No complaints this AM. Daughter was at bedside. They both report breathing improved with the medical treatment and are hopeful to retry grafting this week. She noted intermittent dizziness yesterday which I explained was related to low sugar; she will be sure to tell RN if she feels this way today.  Objective: Vital signs in last 24 hours: Filed Vitals:   12/15/13 0632 12/15/13 1342 12/15/13 2105 12/16/13 0509  BP: 122/82 160/80 166/88 126/86  Pulse: 96 98 104 92  Temp: 98.2 F (36.8 C) 97.7 F (36.5 C) 98 F (36.7 C) 98.4 F (36.9 C)  TempSrc: Oral Oral Oral Oral  Resp: 18 17 18 15   Height:      Weight: 203 lb 11.3 oz (92.4 kg)   199 lb 8 oz (90.493 kg)  SpO2: 96% 96% 97% 98%   Weight change: -4 lb 3.3 oz (-1.907 kg)  Intake/Output Summary (Last 24 hours) at 12/16/13 0720 Last data filed at 12/16/13 0400  Gross per 24 hour  Intake    600 ml  Output   3650 ml  Net  -3050 ml   Vitals reviewed. General: resting in bed, NAD HEENT: PERRL, EOMI, no scleral icterus Cardiac: RRR, no rubs, murmurs or gallops Pulm: clear to auscultation bilaterally, no crackles appreciated Abd: soft, nontender, nondistended, BS present Ext: warm and well perfused, 1+ pitting edema bilaterally Neuro: responds to questions appropriately; moving all extremities freely  Lab Results: Basic Metabolic Panel:  Recent Labs Lab 12/14/13 0400 12/15/13 0452 12/16/13 0400  NA 137 137 140  K 4.0 3.7 3.8  CL 95* 96 97  CO2 23 26 29   GLUCOSE 131* 177* 176*  BUN 83* 79* 80*  CREATININE 6.34* 6.44* 6.35*  CALCIUM 7.7* 7.8* 7.9*  PHOS  --  6.7*  --    CBC:  Recent Labs Lab 12/12/13 1351  12/15/13 0452 12/16/13 0400  WBC 8.9  < > 4.9 5.3  NEUTROABS 6.7  --   --   --   HGB 9.8*  < > 8.4* 8.6*  HCT 29.0*  < > 24.7* 25.3*  MCV 86.1  < > 84.6 84.3  PLT 252  < > 231 231  < > = values in this interval not displayed.  CBG:  Recent Labs Lab 12/15/13 0623 12/15/13 1133  12/15/13 1614 12/15/13 1634 12/15/13 1959 12/16/13 0501  GLUCAP 154* 259* 59* 82 301* 163*   Studies/Results: Dg Chest Port 1 View  12/14/2013   CLINICAL DATA:  Shortness of breath for 1 day.  EXAM: PORTABLE CHEST - 1 VIEW  COMPARISON:  Single view of the chest 12/13/2013.  FINDINGS: Interstitial pulmonary edema seen on the comparison study has markedly improved. There are small to moderate pleural effusions and basilar atelectasis, worse on the left. Cardiomegaly is noted.  IMPRESSION: Marked improvement in pulmonary edema with residual small to moderate effusions and basilar atelectasis, worse on the left.   Electronically Signed   By: Drusilla Kannerhomas  Dalessio M.D.   On: 12/14/2013 15:19   Medications: I have reviewed the patient's current medications. Scheduled Meds: . aspirin EC  81 mg Oral Daily  . atorvastatin  40 mg Oral q1800  . calcitRIOL  0.25 mcg Oral Daily  . calcium acetate  667 mg Oral TID WC  . FLUoxetine  20 mg Oral Daily  . furosemide  160 mg Intravenous 3 times per day  . gabapentin  300 mg Oral TID  . gemfibrozil  600 mg Oral BID AC  .  heparin  5,000 Units Subcutaneous 3 times per day  . hydrALAZINE  10 mg Oral 3 times per day  . insulin aspart  0-5 Units Subcutaneous QHS  . insulin aspart  0-9 Units Subcutaneous TID WC  . insulin aspart  5 Units Subcutaneous TID WC  . insulin glargine  30 Units Subcutaneous QHS  . isosorbide dinitrate  10 mg Oral BID  . metolazone  2.5 mg Oral Q24H  . pantoprazole  40 mg Oral Daily   Continuous Infusions: . sodium chloride 10 mL/hr at 12/14/13 1307   PRN Meds:.acetaminophen, acetaminophen, polyethylene glycol Assessment/Plan: 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   Acute on chronic combined systolic and diastolic congestive heart failure  (EF 40-45%): Wt 199, down 5 lbs from yesterday and net -4.3L. -Continue daily weights, strict I&O -Continue IV Lasix 160mg  TID -Holding irbesartan in setting of AKI  Acute renal failure superimposed on stage 4 chronic kidney disease: Crt stable at 6. Volume status improved this morning though will require dialysis pending graft. Nephrology following, appreciated recommendations  Essential hypertension: BP trending 160/80 yesterday after discontinuation of methyldopa though last BP this AM was 126/86. 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  -Continue Lantus 30u QHS, Novolog 5u TID, SSI-M -Decreased gabapentin 300mg ->200mg  per pharmacy recs given her renal dysfunction -Switched diet to renal/carb modified  Anemia in chronic kidney disease with component of iron deficency: Hb stable at 8.6 this AM. -Started Aranesp 40mg  SQ q14d per renal recs  Secondary hyperparathyroidism:   -Continue Phoslo 667mg  TID with goal of P 5.5 -Continue Calcitriol 0.25mg   Hyperlipidemia: Treating empirically for nephrotic syndrome given her poorly controlled DM. Discontinued gemfibrozil due to risk of rhabdomyolysis.  -Continue atorvastatin 40mg   Depression: Continue Prozac.  FEN:  -Diet: Renal/Carb Modified  DVT prophylaxis: heparin 5000 units subcutaneous  CODE STATUS: FULL CODE  Dispo: Disposition is deferred at this time, awaiting improvement of current medical problems.   The patient does have a current PCP (No Pcp Per Patient) and does not need [facility] after discharge.  The patient does not have transportation limitations that hinder transportation to clinic appointments.  .Services Needed at time of  discharge: Y = Yes, Blank = No PT:   OT:   RN:   Equipment:   Other:     LOS: 4 days   Heywood Ilesushil Patel, MD 12/16/2013, 7:20 AM

## 2013-12-16 NOTE — Progress Notes (Signed)
Patient ID: Joan Young, female   DOB: [birth year], 53 y.o.   MRN: [id number] S:breathing much better today and able to lie flat O:BP 126/86  Pulse 92  Temp(Src) 98.4 F (36.9 C) (Oral)  Resp 15  Ht 5\' 1"  (1.549 m)  Wt 90.493 kg (199 lb 8 oz)  BMI 37.71 kg/m2  SpO2 98%  Intake/Output Summary (Last 24 hours) at 12/16/13 1204 Last data filed at 12/16/13 1008  Gross per 24 hour  Intake    360 ml  Output   3150 ml  Net  -2790 ml   Intake/Output: I/O last 3 completed shifts: In: 720 [P.O.:720] Out: 4250 [Urine:4250]  Intake/Output this shift:  Total I/O In: -  Out: 350 [Urine:350] Weight change: -1.907 kg (-4 lb 3.3 oz) Gen:WD obese HF in NAD CVS:no rub Resp:cta WUJ:WJXBJYAbd:benign Ext:1+ edema left leg, tr on right   Recent Labs Lab 12/12/13 1351 12/13/13 0311 12/14/13 0400 12/15/13 0452 12/16/13 0400  NA 140 141 137 137 140  K 3.6* 3.3* 4.0 3.7 3.8  CL 95* 99 95* 96 97  CO2 26 26 23 26 29   GLUCOSE 163* 115* 131* 177* 176*  BUN 85* 85* 83* 79* 80*  CREATININE 6.23* 6.40* 6.34* 6.44* 6.35*  ALBUMIN 2.4*  --   --   --   --   CALCIUM 7.3* 7.2* 7.7* 7.8* 7.9*  PHOS  --   --   --  6.7*  --   AST 21  --   --   --   --   ALT 13  --   --   --   --    Liver Function Tests:  Recent Labs Lab 12/12/13 1351  AST 21  ALT 13  ALKPHOS 178*  BILITOT <0.2*  PROT 6.5  ALBUMIN 2.4*    Recent Labs Lab 12/12/13 1351  LIPASE 81*   No results found for this basename: AMMONIA,  in the last 168 hours CBC:  Recent Labs Lab 12/12/13 1351 12/14/13 0400 12/15/13 0452 12/16/13 0400  WBC 8.9 5.4 4.9 5.3  NEUTROABS 6.7  --   --   --   HGB 9.8* 8.4* 8.4* 8.6*  HCT 29.0* 25.1* 24.7* 25.3*  MCV 86.1 84.5 84.6 84.3  PLT 252 221 231 231   Cardiac Enzymes:  Recent Labs Lab 12/12/13 1954 12/13/13 0311 12/13/13 2130  TROPONINI <0.30 <0.30 <0.30   CBG:  Recent Labs Lab 12/15/13 1614 12/15/13 1634 12/15/13 1959 12/16/13 0501 12/16/13 1108  GLUCAP 59* 82  301* 163* 192*    Iron Studies:  Recent Labs  12/13/13 1634  IRON 28*  TIBC 220*   Studies/Results: Dg Chest Port 1 View  12/14/2013   CLINICAL DATA:  Shortness of breath for 1 day.  EXAM: PORTABLE CHEST - 1 VIEW  COMPARISON:  Single view of the chest 12/13/2013.  FINDINGS: Interstitial pulmonary edema seen on the comparison study has markedly improved. There are small to moderate pleural effusions and basilar atelectasis, worse on the left. Cardiomegaly is noted.  IMPRESSION: Marked improvement in pulmonary edema with residual small to moderate effusions and basilar atelectasis, worse on the left.   Electronically Signed   By: Drusilla Kannerhomas  Dalessio M.D.   On: 12/14/2013 15:19   . aspirin EC  81 mg Oral Daily  . atorvastatin  40 mg Oral q1800  . calcitRIOL  0.25 mcg Oral Daily  . calcium acetate  667 mg Oral TID WC  . FLUoxetine  20 mg Oral Daily  .  furosemide  160 mg Intravenous 3 times per day  . gabapentin  200 mg Oral Daily  . heparin  5,000 Units Subcutaneous 3 times per day  . hydrALAZINE  10 mg Oral 3 times per day  . insulin aspart  0-5 Units Subcutaneous QHS  . insulin aspart  0-9 Units Subcutaneous TID WC  . insulin aspart  5 Units Subcutaneous TID WC  . insulin glargine  30 Units Subcutaneous QHS  . isosorbide dinitrate  10 mg Oral BID  . metolazone  2.5 mg Oral Q24H  . pantoprazole  40 mg Oral Daily    BMET    Component Value Date/Time   NA 140 12/16/2013 0400   K 3.8 12/16/2013 0400   CL 97 12/16/2013 0400   CO2 29 12/16/2013 0400   GLUCOSE 176* 12/16/2013 0400   BUN 80* 12/16/2013 0400   CREATININE 6.35* 12/16/2013 0400   CALCIUM 7.9* 12/16/2013 0400   GFRNONAA 7* 12/16/2013 0400   GFRAA 8* 12/16/2013 0400   CBC    Component Value Date/Time   WBC 5.3 12/16/2013 0400   RBC 3.00* 12/16/2013 0400   HGB 8.6* 12/16/2013 0400   HCT 25.3* 12/16/2013 0400   PLT 231 12/16/2013 0400   MCV 84.3 12/16/2013 0400   MCH 28.7 12/16/2013 0400   MCHC 34.0 12/16/2013  0400   RDW 13.4 12/16/2013 0400   LYMPHSABS 1.4 12/12/2013 1351   MONOABS 0.5 12/12/2013 1351   EOSABS 0.3 12/12/2013 1351   BASOSABS 0.0 12/12/2013 1351     Assessment/Plan:  1. AKI/CKD vs. Progressive CKD stage 4 related to DM/HTN and CHF- daughter reports that her mother's physicians in [place] told her that she had 25% kidney function 5 months ago. Unclear if this is AKI/CKD related to decompensated CHF or just progressive CKD. Also, per her daughter's report, she does have the option for HD in [place] as well as peritoneal dialysis but would prefer hemodialysis. Will need to establish if she will be able to remain in the [place] or if she has to go back to [place] as this will affect her RRT options.  1. For now goal is to improve CHF and hopefully improve cardiorenal syndrome (per pt was told her GFR was 25% 5 months ago) 2. Scr improved despite increased diuresis 2. CHF acute on chronic- not having significant diuresis  1. cont lasix to 160mg  q8 for another 24 hours then hopefully can decrease to bid 2. cont metolazone 2.5mg  prior to first lasix dose and follow 3. Anemia of chronic disease- SPEP/UPEP pending, will likely require epo +/- IV iron  1. Iron deficient with Tsat of <20% recommend IV ferraheme 1020mg  x 1 dose if ok with primary svc 2. Would also start aranesp 40mcg sq every 2 weeks if ok with primary svc 4. HTN- variable 5. Vascular access- appreciate VVS attempts. Will try again early this week for AVF. 6. Bone and mineral metabolism- iPTH elevated at 521,  1. Started on calcitriol 0.5425mcg daily 7. DM- per primary svc 8. Dispo- daughter is trying to investigate ways to keep her [place] her [place] for care. 9.   Joan Young

## 2013-12-17 LAB — CBC
HEMATOCRIT: 28.7 % — AB (ref 36.0–46.0)
Hemoglobin: 9.5 g/dL — ABNORMAL LOW (ref 12.0–15.0)
MCH: 27.8 pg (ref 26.0–34.0)
MCHC: 33.1 g/dL (ref 30.0–36.0)
MCV: 83.9 fL (ref 78.0–100.0)
Platelets: 266 10*3/uL (ref 150–400)
RBC: 3.42 MIL/uL — ABNORMAL LOW (ref 3.87–5.11)
RDW: 13.5 % (ref 11.5–15.5)
WBC: 6.1 10*3/uL (ref 4.0–10.5)

## 2013-12-17 LAB — BASIC METABOLIC PANEL
ANION GAP: 15 (ref 5–15)
Anion gap: 18 — ABNORMAL HIGH (ref 5–15)
BUN: 77 mg/dL — ABNORMAL HIGH (ref 6–23)
BUN: 73 mg/dL — ABNORMAL HIGH (ref 6–23)
CALCIUM: 8.7 mg/dL (ref 8.4–10.5)
CALCIUM: 8.6 mg/dL (ref 8.4–10.5)
CHLORIDE: 95 meq/L — AB (ref 96–112)
CO2: 30 meq/L (ref 19–32)
CO2: 27 mEq/L (ref 19–32)
CREATININE: 6.42 mg/dL — AB (ref 0.50–1.10)
CREATININE: 6.35 mg/dL — AB (ref 0.50–1.10)
Chloride: 92 mEq/L — ABNORMAL LOW (ref 96–112)
GFR calc Af Amer: 8 mL/min — ABNORMAL LOW (ref >90)
GFR calc non Af Amer: 7 mL/min — ABNORMAL LOW (ref >90)
GFR, EST AFRICAN AMERICAN: 8 mL/min — AB (ref >90)
GFR, EST NON AFRICAN AMERICAN: 7 mL/min — AB (ref >90)
GLUCOSE: 204 mg/dL — AB (ref 70–99)
Glucose, Bld: 83 mg/dL (ref 70–99)
Potassium: 3.4 mEq/L — ABNORMAL LOW (ref 3.7–5.3)
Potassium: 3.8 mEq/L (ref 3.7–5.3)
SODIUM: 140 meq/L (ref 137–147)
SODIUM: 137 meq/L (ref 137–147)

## 2013-12-17 LAB — PROTEIN ELECTROPHORESIS, SERUM
ALBUMIN ELP: 49.4 % — AB (ref 55.8–66.1)
ALPHA-2-GLOBULIN: 16.7 % — AB (ref 7.1–11.8)
Alpha-1-Globulin: 6.8 % — ABNORMAL HIGH (ref 2.9–4.9)
BETA GLOBULIN: 5.9 % (ref 4.7–7.2)
Beta 2: 5.9 % (ref 3.2–6.5)
Gamma Globulin: 15.3 % (ref 11.1–18.8)
M-SPIKE, %: NOT DETECTED g/dL
Total Protein ELP: 5.3 g/dL — ABNORMAL LOW (ref 6.0–8.3)

## 2013-12-17 LAB — UIFE/LIGHT CHAINS/TP QN, 24-HR UR
ALBUMIN, U: DETECTED
ALPHA 1 UR: DETECTED — AB
Alpha 2, Urine: DETECTED — AB
Beta, Urine: DETECTED — AB
GAMMA UR: DETECTED — AB
Total Protein, Urine: 691 mg/dL — ABNORMAL HIGH (ref 5–24)

## 2013-12-17 LAB — GLUCOSE, CAPILLARY
Glucose-Capillary: 87 mg/dL (ref 70–99)
Glucose-Capillary: 216 mg/dL — ABNORMAL HIGH (ref 70–99)
Glucose-Capillary: 85 mg/dL (ref 70–99)
Glucose-Capillary: 223 mg/dL — ABNORMAL HIGH (ref 70–99)

## 2013-12-17 MED ORDER — DEXTROSE 5 % IV SOLN
1.5000 g | INTRAVENOUS | Status: AC
  Filled 2013-12-17: qty 1.5

## 2013-12-17 MED ORDER — FUROSEMIDE 80 MG PO TABS
160.0000 mg | ORAL_TABLET | Freq: Two times a day (BID) | ORAL | Status: DC
  Administered 2013-12-17 – 2013-12-18 (×3): 160 mg via ORAL
  Filled 2013-12-17 (×6): qty 2

## 2013-12-17 MED ORDER — POTASSIUM CHLORIDE CRYS ER 20 MEQ PO TBCR
20.0000 meq | EXTENDED_RELEASE_TABLET | Freq: Once | ORAL | Status: AC
  Administered 2013-12-17: 20 meq via ORAL
  Filled 2013-12-17: qty 1

## 2013-12-17 MED ORDER — HYDRALAZINE HCL 25 MG PO TABS
12.5000 mg | ORAL_TABLET | Freq: Three times a day (TID) | ORAL | Status: DC
  Administered 2013-12-17 – 2013-12-20 (×9): 12.5 mg via ORAL
  Filled 2013-12-17 (×12): qty 0.5

## 2013-12-17 MED ORDER — HYDRALAZINE HCL 10 MG PO TABS: 20.0000 mg | ORAL_TABLET | Freq: Three times a day (TID) | ORAL | Status: DC

## 2013-12-17 MED ORDER — FUROSEMIDE 10 MG/ML IJ SOLN
160.0000 mg | Freq: Two times a day (BID) | INTRAVENOUS | Status: DC
  Filled 2013-12-17: qty 16

## 2013-12-17 NOTE — Progress Notes (Signed)
   VASCULAR SURGERY ASSESSMENT & PLAN:  * CHRONIC KIDNEY DISEASE: Her procedure was canceled Friday because of shortness of breath. She has been rescheduled for surgery on Wednesday.  * I will have her IV moved to the right arm.   SUBJECTIVE: no significant shortness of breath.  PHYSICAL EXAM: Filed Vitals:   12/16/13 0509 12/16/13 1421 12/16/13 2054 12/17/13 0526  BP: 126/86 124/64 173/78 178/80  Pulse: 92 92 99 95  Temp: 98.4 F (36.9 C) 98 F (36.7 C) 99 F (37.2 C) 98 F (36.7 C)  TempSrc: Oral Oral Oral Oral  Resp: 15 17 16 18   Height:      Weight: 199 lb 8 oz (90.493 kg)   194 lb 0.1 oz (88 kg)  SpO2: 98% 95% 99% 98%   Palpable left radial pulse.  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

## 2013-12-17 NOTE — Progress Notes (Signed)
  Date: 12/17/2013  Patient name: Joan Young  Medical record number: [id number]  Date of birth: October 05, 1960   This patient has been seen and the plan of care was discussed with the house staff. Please see their note for complete details. I concur with their findings with the following additions/corrections:  Agree with decrease of lasix dose, please note patient is also on metolazone 2.5mg  daily for diuresis.  She is improving and able to lie flat now.  She is due to have vascular procedure on Wednesday of this week per note review.   Inez CatalinaEmily B Mullen, MD 12/17/2013, 12:14 PM

## 2013-12-17 NOTE — Progress Notes (Signed)
Assessment/Plan:  1. AKI & CKD vs. Progressive CKD stage 4 related to DM/HTN and CHF-  1. For now goal is to improve CHF and hopefully improve cardiorenal syndrome (per pt was told her GFR was 25% 5 months ago) 2. CHF acute on chronic- not having significant diuresis  1. Change to PO Furosemide 3. Anemia of chronic disease-  4. HTN- variable 5. Vascular access- appreciate VVS attempts. Wed for AVF. 6. Bone and mineral metabolism- iPTH elevated at 521,  7. DM- per primary svc 8. Dispo- daughter is trying to investigate ways to keep her [place].  Subjective: Interval History: Good diuresis and weight loss.  Breathing better.  Objective: Vital signs in last 24 hours: Temp:  [98 F (36.7 C)-99 F (37.2 C)] 98 F (36.7 C) (10/19 1500) Pulse Rate:  [94-99] 94 (10/19 1500) Resp:  [16-18] 18 (10/19 1500) BP: (173-178)/(78-80) 178/80 mmHg (10/19 0526) SpO2:  [98 %-99 %] 98 % (10/19 1500) Weight:  [88 kg (194 lb 0.1 oz)] 88 kg (194 lb 0.1 oz) (10/19 0526) Weight change: -2.493 kg (-5 lb 7.9 oz)  Intake/Output from previous day: 10/18 0701 - 10/19 0700 In: 360 [P.O.:360] Out: 4350 [Urine:4350] Intake/Output this shift: Total I/O In: 360 [P.O.:360] Out: 1400 [Urine:1400]  General appearance: alert and cooperative Head: Normocephalic, without obvious abnormality, atraumatic Resp: clear to auscultation bilaterally Chest wall: no tenderness Extremities: edema tr Cor RRR  Lab Results:  Recent Labs  12/16/13 0400 12/17/13 0430  WBC 5.3 6.1  HGB 8.6* 9.5*  HCT 25.3* 28.7*  PLT 231 266   BMET:  Recent Labs  12/17/13 0430 12/17/13 1340  NA 140 137  K 3.4* 3.8  CL 95* 92*  CO2 30 27  GLUCOSE 83 204*  BUN 77* 73*  CREATININE 6.42* 6.35*  CALCIUM 8.7 8.6   No results found for this basename: PTH,  in the last 72 hours Iron Studies: No results found for this basename: IRON, TIBC, TRANSFERRIN, FERRITIN,  in the last 72 hours Studies/Results: No  results found.  Scheduled: . aspirin EC  81 mg Oral Daily  . atorvastatin  40 mg Oral q1800  . calcitRIOL  0.25 mcg Oral Daily  . calcium acetate  667 mg Oral TID WC  . [START ON 12/19/2013] cefUROXime (ZINACEF)  IV  1.5 g Intravenous On Call to OR  . darbepoetin (ARANESP) injection - NON-DIALYSIS  40 mcg Subcutaneous Q14 Days  . FLUoxetine  20 mg Oral Daily  . [START ON 12/18/2013] furosemide  160 mg Intravenous BID  . gabapentin  200 mg Oral Daily  . heparin  5,000 Units Subcutaneous 3 times per day  . hydrALAZINE  12.5 mg Oral 3 times per day  . insulin aspart  0-5 Units Subcutaneous QHS  . insulin aspart  0-9 Units Subcutaneous TID WC  . insulin aspart  5 Units Subcutaneous TID WC  . insulin glargine  30 Units Subcutaneous QHS  . isosorbide dinitrate  10 mg Oral BID  . metolazone  2.5 mg Oral Q24H  . pantoprazole  40 mg Oral Daily     LOS: 5 days   Yoshi Mancillas C 12/17/2013,4:32 PM

## 2013-12-17 NOTE — Progress Notes (Signed)
Subjective: No complaints this AM. Daughter was at bedside. She wants to know when her graft will be done.  Objective: Vital signs in last 24 hours: Filed Vitals:   12/16/13 0509 12/16/13 1421 12/16/13 2054 12/17/13 0526  BP: 126/86 124/64 173/78 178/80  Pulse: 92 92 99 95  Temp: 98.4 F (36.9 C) 98 F (36.7 C) 99 F (37.2 C) 98 F (36.7 C)  TempSrc: Oral Oral Oral Oral  Resp: 15 17 16 18   Height:      Weight: 199 lb 8 oz (90.493 kg)   194 lb 0.1 oz (88 kg)  SpO2: 98% 95% 99% 98%   Weight change: -5 lb 7.9 oz (-2.493 kg)  Intake/Output Summary (Last 24 hours) at 12/17/13 0835 Last data filed at 12/17/13 0511  Gross per 24 hour  Intake    360 ml  Output   4350 ml  Net  -3990 ml   Vitals reviewed. General: resting in bed, 2L O2 by nasal cannula HEENT: PERRL, EOMI, no scleral icterus Cardiac: RRR, no rubs, murmurs or gallops Pulm: clear to auscultation bilaterally, no crackles appreciated Abd: soft, nontender, nondistended, BS present Ext: warm and well perfused, no pitting edema bilaterally Neuro: responds to questions appropriately; moving all extremities freely  Lab Results: Basic Metabolic Panel:  Recent Labs Lab 12/14/13 0400 12/15/13 0452 12/16/13 0400 12/17/13 0430  NA 137 137 140 140  K 4.0 3.7 3.8 3.4*  CL 95* 96 97 95*  CO2 23 26 29 30   GLUCOSE 131* 177* 176* 83  BUN 83* 79* 80* 77*  CREATININE 6.34* 6.44* 6.35* 6.42*  CALCIUM 7.7* 7.8* 7.9* 8.7  PHOS  --  6.7*  --   --    CBC:  Recent Labs Lab 12/12/13 1351  12/16/13 0400 12/17/13 0430  WBC 8.9  < > 5.3 6.1  NEUTROABS 6.7  --   --   --   HGB 9.8*  < > 8.6* 9.5*  HCT 29.0*  < > 25.3* 28.7*  MCV 86.1  < > 84.3 83.9  PLT 252  < > 231 266  < > = values in this interval not displayed.  CBG:  Recent Labs Lab 12/15/13 1959 12/16/13 0501 12/16/13 1108 12/16/13 1615 12/16/13 2051 12/17/13 0621  GLUCAP 301* 163* 192* 191* 176* 87   Studies/Results: No results found. Medications:  I have reviewed the patient's current medications. Scheduled Meds: . aspirin EC  81 mg Oral Daily  . atorvastatin  40 mg Oral q1800  . calcitRIOL  0.25 mcg Oral Daily  . calcium acetate  667 mg Oral TID WC  . darbepoetin (ARANESP) injection - NON-DIALYSIS  40 mcg Subcutaneous Q14 Days  . FLUoxetine  20 mg Oral Daily  . furosemide  160 mg Intravenous 3 times per day  . gabapentin  200 mg Oral Daily  . heparin  5,000 Units Subcutaneous 3 times per day  . hydrALAZINE  12.5 mg Oral 3 times per day  . insulin aspart  0-5 Units Subcutaneous QHS  . insulin aspart  0-9 Units Subcutaneous TID WC  . insulin aspart  5 Units Subcutaneous TID WC  . insulin glargine  30 Units Subcutaneous QHS  . isosorbide dinitrate  10 mg Oral BID  . metolazone  2.5 mg Oral Q24H  . pantoprazole  40 mg Oral Daily  . potassium chloride  20 mEq Oral Once   Continuous Infusions: . sodium chloride 10 mL/hr at 12/14/13 1307   PRN Meds:.acetaminophen, acetaminophen, polyethylene glycol  Assessment/Plan: Joan Young is a [age] year old [place] female with DM, HTN, combined systolic and diastolic dysfunction CHF (EF 40-45%), CKD IV, hospitalized for acute-on-chronic combined systolic and diastolic CHF & acute-on-chronic CKD Stage 4 now with hypokalemia.   Acute on chronic combined systolic and diastolic congestive heart failure (EF 40-45%): Wt 194, down 5 lbs from yesterday and net -4L. -Continue daily weights, strict I&O -Decrease IV Lasix 160mg  TID->BID but will give third dose if she develops shortness of breath -Holding irbesartan in setting of AKI  Acute renal failure superimposed on stage 4 chronic kidney disease with hypokalemia: Crt stable at 6, K 3.4. -Replete K 20 mEq and recheck BMET  -Per vascular surgery, scheduled for graft surgery on Wednesday -Nephrology following, appreciated recommendations  Essential hypertension: Last two BP readings 170s/80s. 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  -Continue Lantus 30u QHS, Novolog 5u TID, SSI-M -Continue gabapentin 200mg   Anemia in chronic kidney disease with component of iron deficency: Hb 9.5, up from 8.6. -Continue Aranesp 40mg  SQ q14d per renal recs  Secondary hyperparathyroidism:   -Continue Phoslo 667mg  TID with goal of P 5.5 -Continue Calcitriol 0.25mg   Hyperlipidemia: Treating empirically for nephrotic syndrome given her poorly controlled DM.  -Continue atorvastatin 40mg   Depression: Continue Prozac.  FEN:  -Diet: Renal/Carb Modified  DVT prophylaxis: heparin 5000 units subcutaneous  CODE STATUS: FULL CODE  Dispo: Disposition is deferred at this time, awaiting improvement of current medical problems.   The patient does have a current PCP (No Pcp Per Patient) and [facility] after discharge.  The patient does not have transportation limitations that hinder transportation to clinic appointments.  .Services Needed at time of discharge: Y = Yes, Blank = No PT:   OT:   RN:   Equipment:   Other:     LOS: 5 days   Heywood Ilesushil Allesha Aronoff, MD 12/17/2013, 8:35 AM

## 2013-12-18 LAB — RENAL FUNCTION PANEL
Albumin: 2.3 g/dL — ABNORMAL LOW (ref 3.5–5.2)
Anion gap: 16 — ABNORMAL HIGH (ref 5–15)
BUN: 77 mg/dL — ABNORMAL HIGH (ref 6–23)
CO2: 30 meq/L (ref 19–32)
CREATININE: 6.51 mg/dL — AB (ref 0.50–1.10)
Calcium: 8.9 mg/dL (ref 8.4–10.5)
Chloride: 92 mEq/L — ABNORMAL LOW (ref 96–112)
GFR calc non Af Amer: 7 mL/min — ABNORMAL LOW (ref >90)
GFR, EST AFRICAN AMERICAN: 8 mL/min — AB (ref >90)
Glucose, Bld: 165 mg/dL — ABNORMAL HIGH (ref 70–99)
PHOSPHORUS: 7 mg/dL — AB (ref 2.3–4.6)
Potassium: 3.3 mEq/L — ABNORMAL LOW (ref 3.7–5.3)
SODIUM: 138 meq/L (ref 137–147)

## 2013-12-18 LAB — CBC
HCT: 31.3 % — ABNORMAL LOW (ref 36.0–46.0)
HEMOGLOBIN: 10.5 g/dL — AB (ref 12.0–15.0)
MCH: 28.8 pg (ref 26.0–34.0)
MCHC: 33.5 g/dL (ref 30.0–36.0)
MCV: 85.8 fL (ref 78.0–100.0)
Platelets: 276 10*3/uL (ref 150–400)
RBC: 3.65 MIL/uL — AB (ref 3.87–5.11)
RDW: 13.5 % (ref 11.5–15.5)
WBC: 5.9 10*3/uL (ref 4.0–10.5)

## 2013-12-18 LAB — GLUCOSE, CAPILLARY
GLUCOSE-CAPILLARY: 127 mg/dL — AB (ref 70–99)
Glucose-Capillary: 166 mg/dL — ABNORMAL HIGH (ref 70–99)
Glucose-Capillary: 119 mg/dL — ABNORMAL HIGH (ref 70–99)
Glucose-Capillary: 331 mg/dL — ABNORMAL HIGH (ref 70–99)

## 2013-12-18 MED ORDER — POTASSIUM CHLORIDE CRYS ER 20 MEQ PO TBCR
40.0000 meq | EXTENDED_RELEASE_TABLET | Freq: Once | ORAL | Status: AC
  Administered 2013-12-18: 40 meq via ORAL
  Filled 2013-12-18: qty 2

## 2013-12-18 NOTE — Progress Notes (Signed)
  [facility] Progress Note  12/18/2013 8:38 AM  Patient scheduled for left arm brachial-cephalic fistula tomorrow with Dr. Imogene Burnhen. NPO past midnight.   Maris BergerKimberly Taesha Goodell, PA-C [facility] Office: [phone] Pager: [phone] 12/18/2013 8:38 AM

## 2013-12-18 NOTE — Progress Notes (Signed)
Currently patient is resting and complains of no pain or discomfort with daughter at the bedside. Will continue to monitor patient to end of shift.

## 2013-12-18 NOTE — Progress Notes (Signed)
Patient ID: Joan Young, female    DOB: April 06, 1960, 53 y.o.   MRN: [id number]  Phone interpreter used [place], #[id number]  S: No complaints, feels well currently.  O:BP 156/72  Pulse 97  Temp(Src) 98.6 F (37 Young) (Oral)  Resp 18  Ht 5\' 1"  (1.549 m)  Wt 190 lb 7.6 oz (86.4 kg)  BMI 36.01 kg/m2  SpO2 100%  Intake/Output Summary (Last 24 hours) at 12/18/13 1503 Last data filed at 12/18/13 1456  Gross per 24 hour  Intake    530 ml  Output   2700 ml  Net  -2170 ml   Intake/Output: I/O last 3 completed shifts: In: 530 [P.O.:530] Out: 5000 [Urine:5000]  Intake/Output this shift:  Total I/O In: 480 [P.O.:480] Out: 1250 [Urine:1250] Weight change: -3 lb 8.4 oz (-1.6 kg)  Gen:NAD, able to move herself on the bed easily CVS:RRR, no mrg Resp: CTAB Abd: soft, obese, +BS Ext: trace edema, WWP.   Recent Labs Lab 12/12/13 1351 12/13/13 0311 12/14/13 0400 12/15/13 0452 12/16/13 0400 12/17/13 0430 12/17/13 1340 12/18/13 0504  NA 140 141 137 137 140 140 137 138  K 3.6* 3.3* 4.0 3.7 3.8 3.4* 3.8 3.3*  CL 95* 99 95* 96 97 95* 92* 92*  CO2 26 26 23 26 29 30 27 30   GLUCOSE 163* 115* 131* 177* 176* 83 204* 165*  BUN 85* 85* 83* 79* 80* 77* 73* 77*  CREATININE 6.23* 6.40* 6.34* 6.44* 6.35* 6.42* 6.35* 6.51*  ALBUMIN 2.4*  --   --   --   --   --   --  2.3*  CALCIUM 7.3* 7.2* 7.7* 7.8* 7.9* 8.7 8.6 8.9  PHOS  --   --   --  6.7*  --   --   --  7.0*  AST 21  --   --   --   --   --   --   --   ALT 13  --   --   --   --   --   --   --    Liver Function Tests:  Recent Labs Lab 12/12/13 1351 12/18/13 0504  AST 21  --   ALT 13  --   ALKPHOS 178*  --   BILITOT <0.2*  --   PROT 6.5  --   ALBUMIN 2.4* 2.3*    Recent Labs Lab 12/12/13 1351  LIPASE 81*   No results found for this basename: AMMONIA,  in the last 168 hours CBC:  Recent Labs Lab 12/12/13 1351 12/14/13 0400 12/15/13 0452 12/16/13 0400 12/17/13 0430 12/18/13 0504  WBC 8.9 5.4 4.9 5.3 6.1 5.9   NEUTROABS 6.7  --   --   --   --   --   HGB 9.8* 8.4* 8.4* 8.6* 9.5* 10.5*  HCT 29.0* 25.1* 24.7* 25.3* 28.7* 31.3*  MCV 86.1 84.5 84.6 84.3 83.9 85.8  PLT 252 221 231 231 266 276   Cardiac Enzymes:  Recent Labs Lab 12/12/13 1954 12/13/13 0311 12/13/13 2130  TROPONINI <0.30 <0.30 <0.30   CBG:  Recent Labs Lab 12/17/13 1113 12/17/13 1629 12/17/13 2105 12/18/13 0624 12/18/13 1053  GLUCAP 216* 85 223* 166* 127*    Iron Studies: No results found for this basename: IRON, TIBC, TRANSFERRIN, FERRITIN,  in the last 72 hours Studies/Results: No results found. Marland Kitchen. aspirin EC  81 mg Oral Daily  . atorvastatin  40 mg Oral q1800  . calcitRIOL  0.25 mcg Oral Daily  . calcium  acetate  667 mg Oral TID WC  . [START ON 12/19/2013] cefUROXime (ZINACEF)  IV  1.5 g Intravenous On Call to OR  . darbepoetin (ARANESP) injection - NON-DIALYSIS  40 mcg Subcutaneous Q14 Days  . FLUoxetine  20 mg Oral Daily  . furosemide  160 mg Oral BID  . gabapentin  200 mg Oral Daily  . heparin  5,000 Units Subcutaneous 3 times per day  . hydrALAZINE  12.5 mg Oral 3 times per day  . insulin aspart  0-5 Units Subcutaneous QHS  . insulin aspart  0-9 Units Subcutaneous TID WC  . insulin aspart  5 Units Subcutaneous TID WC  . insulin glargine  30 Units Subcutaneous QHS  . isosorbide dinitrate  10 mg Oral BID  . metolazone  2.5 mg Oral Q24H  . pantoprazole  40 mg Oral Daily    BMET    Component Value Date/Time   NA 138 12/18/2013 0504   K 3.3* 12/18/2013 0504   CL 92* 12/18/2013 0504   CO2 30 12/18/2013 0504   GLUCOSE 165* 12/18/2013 0504   BUN 77* 12/18/2013 0504   CREATININE 6.51* 12/18/2013 0504   CALCIUM 8.9 12/18/2013 0504   GFRNONAA 7* 12/18/2013 0504   GFRAA 8* 12/18/2013 0504   CBC    Component Value Date/Time   WBC 5.9 12/18/2013 0504   RBC 3.65* 12/18/2013 0504   HGB 10.5* 12/18/2013 0504   HCT 31.3* 12/18/2013 0504   PLT 276 12/18/2013 0504   MCV 85.8 12/18/2013 0504   MCH 28.8  12/18/2013 0504   MCHC 33.5 12/18/2013 0504   RDW 13.5 12/18/2013 0504   LYMPHSABS 1.4 12/12/2013 1351   MONOABS 0.5 12/12/2013 1351   EOSABS 0.3 12/12/2013 1351   BASOSABS 0.0 12/12/2013 1351     Assessment/Plan:  1. AKI & CKD vs. Progressive CKD stage 4 related to DM/HTN and CHF- For now goal is to improve CHF and hopefully improve cardiorenal syndrome (per pt was told her GFR was 25% 5 months ago). She has not established with nephrology yet since recently moving to [place] from [place]. Scr stable. 2. CHF acute on chronic: net -11.5 L.  3. Anemia of chronic disease: stable, on aranesp. 4. Vascular access: appreciate VVS assistance. AVF planned for 10/21.  5. CKD-MBD: iPTH elevated at 521, Ca normal, Phos elevated 7.0. On calcitriol and phoslo. 6. Hypokalemia: rcv'd <MEASUREMKoreaEN4Energy Transfer PaEngBeaumPrincella Pel1RubyMaylandDo(<MEASUREMENKoreaT>4Energy Transfer PaEngBeltway Princella Pel1RubyOlympian Villag<MEASUREMENKoreaT>4Energy Transfer PaEngSouth TexaPrincella Pel1RubyNorth UticaDolor<MEASUREMENKoreaT>4Energy Transfer PaEngLompoc VaPrincella Pel1RubyHilltopDol(<MEASUREMENKoreaT>4Energy Transfer PaEngFloPrincella Pel1RubyFrankto<MEASUREMENKoreaT>4Energy Transfer PaEngPrincella Pel1RubyFriendshipDolor<MEASUREMENKoreaT>4Energy Transfer PaEngCoral ShorePrincella Pel1RubyCentral GarageDol<MEASUREMENKoreaT>4Energy Transfer PaEngStPrincella Pel1RubyR(3<MEASUREMENKoreaT>4Energy Transfer PaEngVibra Hospital Of Southeastern Princella Pel1RubyGarl<MEASUREMENKoreaT>4Energy Transfer PaEngMouPrincella Pel1RubyShelbyvilleDo(<MEASUREMENKoreaT>4Energy Transfer PaEngMercy Princella Pel1RubyCarolina Fore<MEASUREMENKoreaT>4Energy Transfer PaEngGolden VallePrincella Pel1RubyKings<MEASUREMENKoreaT>4Energy Transfer PaEngRehabilitation HoPrincella Pel1RubySomer(<MEASUREMENKoreaT>4Energy Transfer PaEngBeaPrincella Pel1RubyScottsbluffDolor716(<MEASUREMENKoreaT>4Energy Transfer PaEngVa MedicalPrincella Pel1RubyHannaDolor<MEASUREMENKoreaT>4Energy TransferPrincella Pe1RubyMelvil(<MEASUREMENKoreaT>4Energy Transfer PaEngCrawlePrincella Pel1RubyLucerne MinesDolo(<MEASUREMENKoreaT>4Energy Transfer PaEngThe [facility]-A MePrincella Pel1RubySanta ClaraDo<MEASUREMENKoreaT>4Energy Transfer PaEngThree Rivers EPrincella Pel1RubyVerde Village<MEASUREMENKoreaT>4Energy Transfer PaEnPrincella Pel1RubyFort WhiteDo<MEASUREMENKoreaT>4Energy Transfer PaEngMad RiverPrincella Pel1RubyTalt(2<MEASUREMENKoreaT>4Energy Transfer PaEngSoutheasthealth CentPrincella Pel1RubyAvon Lake<MEASUREMENKoreaT>4Energy Transfer PaEngEncompass Health RehabilitatioPrincella [phone] [address].ariable  8. DM: per primary svc   Wight, Andrew   Renal Attending: Agree with above. For AV access in AM.  This will prepare her for OP HD in [place] or in the [place].  She does not have the ability to stay in the [place] long term so I anticipate she will be returning to her home country. Joan Young

## 2013-12-18 NOTE — Progress Notes (Signed)
Subjective: She was complaining of leg cramps this AM, and I explained this is related to her low K. Daughter, granddaughter, and husband were at bedside.   Objective: Vital signs in last 24 hours: Filed Vitals:   12/17/13 0526 12/17/13 1500 12/17/13 2110 12/18/13 0605  BP: 178/80  147/84 119/67  Pulse: 95 94 91 88  Temp: 98 F (36.7 C) 98 F (36.7 C) 98.2 F (36.8 C) 98.4 F (36.9 C)  TempSrc: Oral Oral Oral Oral  Resp: 18 18 18 18   Height:      Weight: 194 lb 0.1 oz (88 kg)   190 lb 7.6 oz (86.4 kg)  SpO2: 98% 98% 98% 96%   Weight change: -3 lb 8.4 oz (-1.6 kg)  Intake/Output Summary (Last 24 hours) at 12/18/13 1417 Last data filed at 12/18/13 1024  Gross per 24 hour  Intake    530 ml  Output   2450 ml  Net  -1920 ml   Vitals reviewed. General: resting in bed, 2L O2 by nasal cannula HEENT: PERRL, EOMI, no scleral icterus Cardiac: RRR, no rubs, murmurs or gallops Pulm: clear to auscultation bilaterally, no crackles appreciated Abd: soft, nontender, nondistended, BS present Ext: warm and well perfused, no pitting edema bilaterally Neuro: responds to questions appropriately; moving all extremities freely  Lab Results: Basic Metabolic Panel:  Recent Labs Lab 12/15/13 0452  12/17/13 1340 12/18/13 0504  NA 137  < > 137 138  K 3.7  < > 3.8 3.3*  CL 96  < > 92* 92*  CO2 26  < > 27 30  GLUCOSE 177*  < > 204* 165*  BUN 79*  < > 73* 77*  CREATININE 6.44*  < > 6.35* 6.51*  CALCIUM 7.8*  < > 8.6 8.9  PHOS 6.7*  --   --  7.0*  < > = values in this interval not displayed.  CBC:  Recent Labs Lab 12/12/13 1351  12/17/13 0430 12/18/13 0504  WBC 8.9  < > 6.1 5.9  NEUTROABS 6.7  --   --   --   HGB 9.8*  < > 9.5* 10.5*  HCT 29.0*  < > 28.7* 31.3*  MCV 86.1  < > 83.9 85.8  PLT 252  < > 266 276  < > = values in this interval not displayed.  CBG:  Recent Labs Lab 12/17/13 0621 12/17/13 1113 12/17/13 1629 12/17/13 2105 12/18/13 0624 12/18/13 1053  GLUCAP  87 216* 85 223* 166* 127*   Medications: I have reviewed the patient's current medications. Scheduled Meds: . aspirin EC  81 mg Oral Daily  . atorvastatin  40 mg Oral q1800  . calcitRIOL  0.25 mcg Oral Daily  . calcium acetate  667 mg Oral TID WC  . [START ON 12/19/2013] cefUROXime (ZINACEF)  IV  1.5 g Intravenous On Call to OR  . darbepoetin (ARANESP) injection - NON-DIALYSIS  40 mcg Subcutaneous Q14 Days  . FLUoxetine  20 mg Oral Daily  . furosemide  160 mg Oral BID  . gabapentin  200 mg Oral Daily  . heparin  5,000 Units Subcutaneous 3 times per day  . hydrALAZINE  12.5 mg Oral 3 times per day  . insulin aspart  0-5 Units Subcutaneous QHS  . insulin aspart  0-9 Units Subcutaneous TID WC  . insulin aspart  5 Units Subcutaneous TID WC  . insulin glargine  30 Units Subcutaneous QHS  . isosorbide dinitrate  10 mg Oral BID  . metolazone  2.5 mg Oral Q24H  . pantoprazole  40 mg Oral Daily   Continuous Infusions: . sodium chloride 10 mL/hr at 12/14/13 1307   PRN Meds:.acetaminophen, acetaminophen, polyethylene glycol Assessment/Plan:  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 -Continue daily weights, strict I&O -Continue Lasix 160mg  BID PO -Holding irbesartan in setting of AKI  Acute renal failure superimposed on stage 4 chronic kidney disease with hypokalemia: Crt stable at 6, K 3.4. -Replete K 40 mEq and recheck BMET  -Per vascular surgery, scheduled for graft surgery on Wednesday -Nephrology following, appreciated recommendations  Essential hypertension: BP 119/67. 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  -Continue Lantus 30u QHS, Novolog 5u TID, SSI-M -Continue gabapentin 200mg   Anemia in chronic kidney disease with component of iron deficency: Hb 10.5, up from 9.5. -Continue Aranesp 40mg  SQ q14d per renal recs  Secondary hyperparathyroidism:   -Continue Phoslo 667mg  TID with goal of P 5.5 -Continue Calcitriol 0.25mg   Hyperlipidemia: Treating empirically for nephrotic syndrome given her poorly controlled DM.  -Continue atorvastatin 40mg   Depression: Continue Prozac.  FEN:  -Diet: Renal/Carb Modified  DVT prophylaxis: heparin 5000 units subcutaneous  CODE STATUS: FULL CODE  Dispo: Disposition is deferred at this time, awaiting improvement of current medical problems.   The patient does have a current PCP (No Pcp Per Patient) and [facility] after discharge.  The patient does not have transportation limitations that hinder transportation to clinic appointments.  .Services Needed at time of discharge: Y = Yes, Blank = No PT:   OT:   RN:   Equipment:   Other:     LOS: 6 days   Heywood Ilesushil , MD 12/18/2013, 2:17 PM

## 2013-12-18 NOTE — Progress Notes (Signed)
  Date: 12/18/2013  Patient name[facility]  Medical record number: [id number]  Date of birth: 04/14/1960   This patient has been seen and the plan of care was discussed with the house staff. Please see their note for complete details. I concur with their findings.  Inez CatalinaEmily B Chistopher Mangino, MD 12/18/2013, 10:57 PM

## 2013-12-19 ENCOUNTER — Inpatient Hospital Stay (HOSPITAL_COMMUNITY): Payer: PRIVATE HEALTH INSURANCE | Admitting: Certified Registered Nurse Anesthetist

## 2013-12-19 ENCOUNTER — Encounter (HOSPITAL_COMMUNITY): Payer: Self-pay | Admitting: Certified Registered Nurse Anesthetist

## 2013-12-19 ENCOUNTER — Encounter (HOSPITAL_COMMUNITY): Payer: PRIVATE HEALTH INSURANCE | Admitting: Certified Registered Nurse Anesthetist

## 2013-12-19 ENCOUNTER — Encounter (HOSPITAL_COMMUNITY)
Admission: EM | Disposition: A | Discharge status: Home / Self Care | Payer: Self-pay | Source: Home / Self Care | Attending: Internal Medicine

## 2013-12-19 HISTORY — PX: AV FISTULA PLACEMENT: SHX1204

## 2013-12-19 LAB — CBC
HEMATOCRIT: 33.4 % — AB (ref 36.0–46.0)
HEMOGLOBIN: 11.3 g/dL — AB (ref 12.0–15.0)
MCH: 29.1 pg (ref 26.0–34.0)
MCHC: 33.8 g/dL (ref 30.0–36.0)
MCV: 86.1 fL (ref 78.0–100.0)
Platelets: 279 10*3/uL (ref 150–400)
RBC: 3.88 MIL/uL (ref 3.87–5.11)
RDW: 13.6 % (ref 11.5–15.5)
WBC: 6.2 10*3/uL (ref 4.0–10.5)

## 2013-12-19 LAB — RENAL FUNCTION PANEL
Albumin: 2.3 g/dL — ABNORMAL LOW (ref 3.5–5.2)
Anion gap: 17 — ABNORMAL HIGH (ref 5–15)
BUN: 80 mg/dL — AB (ref 6–23)
CO2: 28 mEq/L (ref 19–32)
CREATININE: 6.81 mg/dL — AB (ref 0.50–1.10)
Calcium: 9.2 mg/dL (ref 8.4–10.5)
Chloride: 92 mEq/L — ABNORMAL LOW (ref 96–112)
GFR calc Af Amer: 7 mL/min — ABNORMAL LOW (ref >90)
GFR calc non Af Amer: 6 mL/min — ABNORMAL LOW (ref >90)
GLUCOSE: 196 mg/dL — AB (ref 70–99)
PHOSPHORUS: 6.8 mg/dL — AB (ref 2.3–4.6)
POTASSIUM: 3.7 meq/L (ref 3.7–5.3)
Sodium: 137 mEq/L (ref 137–147)

## 2013-12-19 LAB — MRSA PCR SCREENING: MRSA BY PCR: NEGATIVE

## 2013-12-19 LAB — GLUCOSE, CAPILLARY
GLUCOSE-CAPILLARY: 167 mg/dL — AB (ref 70–99)
GLUCOSE-CAPILLARY: 109 mg/dL — AB (ref 70–99)
GLUCOSE-CAPILLARY: 199 mg/dL — AB (ref 70–99)
Glucose-Capillary: 136 mg/dL — ABNORMAL HIGH (ref 70–99)
Glucose-Capillary: 148 mg/dL — ABNORMAL HIGH (ref 70–99)

## 2013-12-19 SURGERY — ARTERIOVENOUS (AV) FISTULA CREATION
Anesthesia: Monitor Anesthesia Care | Site: Arm Upper | Laterality: Left

## 2013-12-19 MED ORDER — LIDOCAINE HCL (CARDIAC) 20 MG/ML IV SOLN
INTRAVENOUS | Status: DC | PRN
  Administered 2013-12-19: 60 mg via INTRAVENOUS

## 2013-12-19 MED ORDER — PROPOFOL INFUSION 10 MG/ML OPTIME
INTRAVENOUS | Status: DC | PRN
  Administered 2013-12-19: 25 ug/kg/min via INTRAVENOUS
  Administered 2013-12-19: 50 ug/kg/min via INTRAVENOUS

## 2013-12-19 MED ORDER — FENTANYL CITRATE 0.05 MG/ML IJ SOLN
INTRAMUSCULAR | Status: AC
  Filled 2013-12-19: qty 2

## 2013-12-19 MED ORDER — FENTANYL CITRATE 0.05 MG/ML IJ SOLN
INTRAMUSCULAR | Status: DC | PRN
  Administered 2013-12-19 (×2): 25 ug via INTRAVENOUS

## 2013-12-19 MED ORDER — LIDOCAINE-EPINEPHRINE (PF) 1 %-1:200000 IJ SOLN
INTRAMUSCULAR | Status: DC | PRN
  Administered 2013-12-19: 30 mL

## 2013-12-19 MED ORDER — FENTANYL CITRATE 0.05 MG/ML IJ SOLN
25.0000 ug | INTRAMUSCULAR | Status: DC | PRN
  Administered 2013-12-19: 50 ug via INTRAVENOUS

## 2013-12-19 MED ORDER — SODIUM CHLORIDE 0.9 % IR SOLN
Status: DC | PRN
  Administered 2013-12-19: 11:00:00

## 2013-12-19 MED ORDER — FENTANYL CITRATE 0.05 MG/ML IJ SOLN
INTRAMUSCULAR | Status: AC
  Filled 2013-12-19: qty 5

## 2013-12-19 MED ORDER — THROMBIN 20000 UNITS EX SOLR
CUTANEOUS | Status: AC
  Filled 2013-12-19: qty 20000

## 2013-12-19 MED ORDER — MIDAZOLAM HCL 2 MG/2ML IJ SOLN
INTRAMUSCULAR | Status: AC
  Filled 2013-12-19: qty 2

## 2013-12-19 MED ORDER — THROMBIN 20000 UNITS EX SOLR
CUTANEOUS | Status: DC | PRN
  Administered 2013-12-19: 13:00:00 via TOPICAL

## 2013-12-19 MED ORDER — MIDAZOLAM HCL 5 MG/5ML IJ SOLN
INTRAMUSCULAR | Status: DC | PRN
  Administered 2013-12-19: 2 mg via INTRAVENOUS

## 2013-12-19 MED ORDER — 0.9 % SODIUM CHLORIDE (POUR BTL) OPTIME
TOPICAL | Status: DC | PRN
  Administered 2013-12-19: 1000 mL

## 2013-12-19 MED ORDER — FUROSEMIDE 80 MG PO TABS
80.0000 mg | ORAL_TABLET | Freq: Two times a day (BID) | ORAL | Status: DC
  Administered 2013-12-20: 80 mg via ORAL
  Filled 2013-12-19 (×3): qty 1

## 2013-12-19 MED ORDER — LIDOCAINE-EPINEPHRINE (PF) 1 %-1:200000 IJ SOLN
INTRAMUSCULAR | Status: AC
Start: 2013-12-19 — End: 2013-12-19
  Filled 2013-12-19: qty 10

## 2013-12-19 MED ORDER — BUPIVACAINE HCL (PF) 0.5 % IJ SOLN
INTRAMUSCULAR | Status: DC | PRN
  Administered 2013-12-19: 30 mL

## 2013-12-19 SURGICAL SUPPLY — 37 items
ADH SKN CLS APL DERMABOND .7 (GAUZE/BANDAGES/DRESSINGS) ×1
ARMBAND PINK RESTRICT EXTREMIT (MISCELLANEOUS) ×3 IMPLANT
BLADE SURG 10 STRL SS (BLADE) ×3 IMPLANT
CANISTER SUCTION 2500CC (MISCELLANEOUS) ×3 IMPLANT
CLIP TI MEDIUM 6 (CLIP) ×3 IMPLANT
CLIP TI WIDE RED SMALL 6 (CLIP) ×3 IMPLANT
COVER PROBE W GEL 5X96 (DRAPES) ×2 IMPLANT
COVER SURGICAL LIGHT HANDLE (MISCELLANEOUS) ×3 IMPLANT
DECANTER SPIKE VIAL GLASS SM (MISCELLANEOUS) ×3 IMPLANT
DERMABOND ADVANCED (GAUZE/BANDAGES/DRESSINGS) ×2
DERMABOND ADVANCED .7 DNX12 (GAUZE/BANDAGES/DRESSINGS) ×1 IMPLANT
ELECT REM PT RETURN 9FT ADLT (ELECTROSURGICAL) ×3
ELECTRODE REM PT RTRN 9FT ADLT (ELECTROSURGICAL) ×1 IMPLANT
GLOVE BIO SURGEON STRL SZ 6.5 (GLOVE) ×3 IMPLANT
GLOVE BIO SURGEON STRL SZ7 (GLOVE) ×3 IMPLANT
GLOVE BIO SURGEONS STRL SZ 6.5 (GLOVE) ×3
GLOVE BIOGEL PI IND STRL 7.5 (GLOVE) ×1 IMPLANT
GLOVE BIOGEL PI INDICATOR 7.5 (GLOVE) ×2
GLOVE SKINSENSE NS SZ7.0 (GLOVE) ×4
GLOVE SKINSENSE STRL SZ7.0 (GLOVE) IMPLANT
GOWN BRE IMP SLV AUR XL STRL (GOWN DISPOSABLE) ×6 IMPLANT
GOWN STRL REUS W/ TWL LRG LVL3 (GOWN DISPOSABLE) ×3 IMPLANT
GOWN STRL REUS W/TWL LRG LVL3 (GOWN DISPOSABLE) ×9
KIT BASIN OR (CUSTOM PROCEDURE TRAY) ×3 IMPLANT
KIT ROOM TURNOVER OR (KITS) ×3 IMPLANT
NS IRRIG 1000ML POUR BTL (IV SOLUTION) ×3 IMPLANT
PACK CV ACCESS (CUSTOM PROCEDURE TRAY) ×3 IMPLANT
PAD ARMBOARD 7.5X6 YLW CONV (MISCELLANEOUS) ×6 IMPLANT
PROBE PENCIL 8 MHZ STRL DISP (MISCELLANEOUS) ×2 IMPLANT
SPONGE SURGIFOAM ABS GEL 100 (HEMOSTASIS) ×2 IMPLANT
SUT MNCRL AB 4-0 PS2 18 (SUTURE) ×3 IMPLANT
SUT PROLENE 6 0 BV (SUTURE) IMPLANT
SUT PROLENE 7 0 BV 1 (SUTURE) ×7 IMPLANT
SUT VIC AB 3-0 SH 27 (SUTURE) ×3
SUT VIC AB 3-0 SH 27X BRD (SUTURE) ×1 IMPLANT
UNDERPAD 30X30 INCONTINENT (UNDERPADS AND DIAPERS) ×3 IMPLANT
WATER STERILE IRR 1000ML POUR (IV SOLUTION) ×3 IMPLANT

## 2013-12-19 NOTE — Progress Notes (Signed)
Pt arrived back to floor sleepy but arousable. Denies c/o pain or numbness in left hand. Palpable radial pulse left wrist. Left hand warm and mobile. Family at bedside

## 2013-12-19 NOTE — Progress Notes (Signed)
Patient ID: Joan Young, female    DOB: 1960-03-15, 53 y.o.   MRN: [id number]  S: No complaints, feels well currently. Ready for surgery  O:BP 154/76  Pulse 87  Temp(Src) 98.1 F (36.7 Young) (Oral)  Resp 18  Ht 5\' 1"  (1.549 m)  Wt 188 lb 0.8 oz (85.3 kg)  BMI 35.55 kg/m2  SpO2 100%  Intake/Output Summary (Last 24 hours) at 12/19/13 0821 Last data filed at 12/19/13 0818  Gross per 24 hour  Intake    720 ml  Output   2500 ml  Net  -1780 ml   Intake/Output: I/O last 3 completed shifts: In: 720 [P.O.:720] Out: 3950 [Urine:3950]  Intake/Output this shift:    Weight change: -2 lb 6.8 oz (-1.1 kg)  Gen:NAD, able to move herself on the bed easily CVS:RRR, no mrg Resp: CTAB Abd: soft, obese, +BS Ext: trace edema, WWP.   Recent Labs Lab 12/12/13 1351  12/14/13 0400 12/15/13 0452 12/16/13 0400 12/17/13 0430 12/17/13 1340 12/18/13 0504 12/19/13 0409  NA 140  < > 137 137 140 140 137 138 137  K 3.6*  < > 4.0 3.7 3.8 3.4* 3.8 3.3* 3.7  CL 95*  < > 95* 96 97 95* 92* 92* 92*  CO2 26  < > 23 26 29 30 27 30 28   GLUCOSE 163*  < > 131* 177* 176* 83 204* 165* 196*  BUN 85*  < > 83* 79* 80* 77* 73* 77* 80*  CREATININE 6.23*  < > 6.34* 6.44* 6.35* 6.42* 6.35* 6.51* 6.81*  ALBUMIN 2.4*  --   --   --   --   --   --  2.3* 2.3*  CALCIUM 7.3*  < > 7.7* 7.8* 7.9* 8.7 8.6 8.9 9.2  PHOS  --   --   --  6.7*  --   --   --  7.0* 6.8*  AST 21  --   --   --   --   --   --   --   --   ALT 13  --   --   --   --   --   --   --   --   < > = values in this interval not displayed. Liver Function Tests:  Recent Labs Lab 12/12/13 1351 12/18/13 0504 12/19/13 0409  AST 21  --   --   ALT 13  --   --   ALKPHOS 178*  --   --   BILITOT <0.2*  --   --   PROT 6.5  --   --   ALBUMIN 2.4* 2.3* 2.3*    Recent Labs Lab 12/12/13 1351  LIPASE 81*   No results found for this basename: AMMONIA,  in the last 168 hours CBC:  Recent Labs Lab 12/12/13 1351  12/15/13 0452 12/16/13 0400  12/17/13 0430 12/18/13 0504 12/19/13 0409  WBC 8.9  < > 4.9 5.3 6.1 5.9 6.2  NEUTROABS 6.7  --   --   --   --   --   --   HGB 9.8*  < > 8.4* 8.6* 9.5* 10.5* 11.3*  HCT 29.0*  < > 24.7* 25.3* 28.7* 31.3* 33.4*  MCV 86.1  < > 84.6 84.3 83.9 85.8 86.1  PLT 252  < > 231 231 266 276 279  < > = values in this interval not displayed. Cardiac Enzymes:  Recent Labs Lab 12/12/13 1954 12/13/13 0311 12/13/13 2130  TROPONINI <0.30 <0.30 <  0.30   CBG:  Recent Labs Lab 12/18/13 0624 12/18/13 1053 12/18/13 1629 12/18/13 2107 12/19/13 0552  GLUCAP 166* 127* 119* 331* 167*    Iron Studies: No results found for this basename: IRON, TIBC, TRANSFERRIN, FERRITIN,  in the last 72 hours Studies/Results: No results found. Marland Kitchen. aspirin EC  81 mg Oral Daily  . atorvastatin  40 mg Oral q1800  . calcitRIOL  0.25 mcg Oral Daily  . calcium acetate  667 mg Oral TID WC  . cefUROXime (ZINACEF)  IV  1.5 g Intravenous On Call to OR  . darbepoetin (ARANESP) injection - NON-DIALYSIS  40 mcg Subcutaneous Q14 Days  . FLUoxetine  20 mg Oral Daily  . furosemide  160 mg Oral BID  . gabapentin  200 mg Oral Daily  . heparin  5,000 Units Subcutaneous 3 times per day  . hydrALAZINE  12.5 mg Oral 3 times per day  . insulin aspart  0-5 Units Subcutaneous QHS  . insulin aspart  0-9 Units Subcutaneous TID WC  . insulin aspart  5 Units Subcutaneous TID WC  . insulin glargine  30 Units Subcutaneous QHS  . isosorbide dinitrate  10 mg Oral BID  . metolazone  2.5 mg Oral Q24H  . pantoprazole  40 mg Oral Daily    Assessment/Plan:  1. AKI & CKD vs. progressive CKD stage 4 related to DM/HTN and CHF- For now goal is to improve CHF and hopefully improve cardiorenal syndrome (per pt was told her GFR was 25% 5 months ago). Scr rising some today.  2. CHF acute on chronic: net -12.5 L.  3. Anemia of chronic disease: stable, on aranesp. 4. Vascular access: appreciate VVS assistance. AVF planned for today  5. CKD-MBD: iPTH  elevated at 521, Ca normal, Phos elevated 7.0. On calcitriol and phoslo. 6. Hypokalemia: resolved 7. HTN: variable  8. DM: per primary svc   Joan Young, Joan  Renal Attending: She is for AV access today. She has diuresed significantly.  Weight down 9Kg.  We need to stop the metolazone and reduce furosemide. Resume diuretics at lower dose in AM  Joan Young

## 2013-12-19 NOTE — Anesthesia Postprocedure Evaluation (Signed)
  Anesthesia Post-op Note  Patient: Joan Young  Procedure(s) Performed: Procedure(s): Creation of Left Arm BRACHIOCEPHALIC ARTERIOVENOUS FISTULA  (Left)  Patient Location: PACU  Anesthesia Type:General  Level of Consciousness: awake  Airway and Oxygen Therapy: Patient Spontanous Breathing  Post-op Pain: mild  Post-op Assessment: Post-op Vital signs reviewed  Post-op Vital Signs: Reviewed  Last Vitals:  Filed Vitals:   12/19/13 0446  BP: 154/76  Pulse: 87  Temp: 36.7 C  Resp: 18    Complications: No apparent anesthesia complications

## 2013-12-19 NOTE — Op Note (Signed)
OPERATIVE NOTE   PROCEDURE: 1. Left first stage brachial vein transposition (brachiobrachial arteriovenous fistula) placement  PRE-OPERATIVE DIAGNOSIS: chronic kidney disease stage IV   POST-OPERATIVE DIAGNOSIS: same as above   SURGEON: Joan SakeBrian Chen, MD  ASSISTANT(S): Doreatha MassedSamantha Rhyne, PAC   ANESTHESIA: general  ESTIMATED BLOOD LOSS: 50 cc  FINDING(S): 1. Damaged cephalic vein with multiple holes from recent needle sticks or IV 2. Small friable brachial artery (2.5 mm) 3. Sclerotic cubital vein draining in brachial vein 4. Fistula with weak thrill with pulsatile character at end of case (at risk for failure due to venous stenosis) 5. Palpable radial pulse  SPECIMEN(S):  none  INDICATIONS:   Joan Young is a 53 y.o. female who presents with acute on chronic kidney disease stage IV.  The patient is scheduled for left brachiocephalic arteriovenous fistula .  The patient is aware the risks include but are not limited to: bleeding, infection, steal syndrome, nerve damage, ischemic monomelic neuropathy, failure to mature, and need for additional procedures.  The patient is aware of the risks of the procedure and elects to proceed forward.  DESCRIPTION: After full informed written consent was obtained from the patient, the patient was brought back to the operating room and placed supine upon the operating table.  Prior to induction, the patient received IV antibiotics.   After obtaining adequate anesthesia, the patient was then prepped and draped in the standard fashion for a left arm access procedure.  I turned my attention first to identifying the patient's cephlliac vein and brachial artery.  Using SonoSite guidance, the location of these vessels were marked out on the skin.   At this point, I injected local anesthetic to obtain a field block of the antecubitum.  In total, I injected about 5 mL of a 1:1 mixture of 0.5% Marcaine without epinephrine and 1% lidocaine with  epinephrine.  I made a transverse incision at the level of the antecubitum and dissected through the subcutaneous tissue and fascia to gain exposure of the brachial artery.  This was noted to be 2.5 mm in diameter externally.  This was dissected out proximally and distally and controlled with vessel loops.  I then dissected out the cephalic vein.  Immediately, there was profuse bleeding from the cephalic vein.  I clamped the vein proximally and distally.  There a longitudinal split in the vein and a hole in the vein distal at the antecubitum.  I did not think this vein was salvageable, so I ligated it proximally and distally with 3-0 Silk.  I then noticed a cubital vein that was sizable which appeared to be draining into the brachial vein system.  This was noted to be 2.5-3.0 mm in diameter externally.  The distal segment of the vein was ligated with a  2-0 silk, and the vein was transected.  The proximal segment was interrogated with serial dilators.  The vein accepted up to a 3.0 mm dilator with some difficulty.  I then instilled the heparinized saline into the vein and clamped it.  At this point, I reset my exposure of the brachial artery and placed the artery under tension proximally and distally.  I made an arteriotomy with a #11 blade, and then I extended the arteriotomy with a Potts scissor.  I injected heparinized saline proximal and distal to this arteriotomy.  The vein was then sewn to the artery in an end-to-side configuration with a running stitch of 7-0 Prolene.  Prior to completing this anastomosis, I allowed the  vein and artery to backbleed.  There was no evidence of clot from any vessels.  I completed the anastomosis in the usual fashion and then released all vessel loops and clamps.  There was a weakly palpable thrill in the venous outflow, and there was a palpable radial pulse.  There was also a pulsatile component to the doppler signal of the fistula.  At this point, I did not feel that another  fistula could be immediately constructed due to the disease and friable nature of her artery.   At this point, I irrigated out the surgical wound.  There was no further active bleeding.  The subcutaneous tissue was reapproximated with a running stitch of 3-0 Vicryl.  The skin was then reapproximated with a running subcuticular stitch of 4-0 Vicryl.  The skin was then cleaned, dried, and reinforced with Dermabond.  The patient tolerated this procedure well.   COMPLICATIONS: none  CONDITION: stable  Joan SakeBrian Chen, MD [facility] Office: [phone] Pager: [phone]  12/19/2013, 1:18 PM

## 2013-12-19 NOTE — Progress Notes (Signed)
  Date: 12/19/2013  Patient name: Joan CopasYAMILETH Cruz [facility] Young  Medical record number: [id number]  Date of birth: 17-Aug-1960   This patient has been seen and the plan of care was discussed with the house staff. Please see their note for complete details. I concur with their findings with the following additions/corrections:  Patient doing well on current regimen, BP is improving.  Renal function slightly worse today.  She is due to have her graft placed.  She may be ready to be discharged today, however, she is continuing to use Oxygen (this will need to be further assessed) and her renal function is slightly worse.  The team will follow up with her this afternoon.   Inez CatalinaEmily B Myia Bergh, MD 12/19/2013, 1:45 PM

## 2013-12-19 NOTE — Transfer of Care (Signed)
Immediate Anesthesia Transfer of Care Note  Patient: Joan Young  Procedure(s) Performed: Procedure(s): Creation of Left Arm BRACHIOCEPHALIC ARTERIOVENOUS FISTULA  (Left)  Patient Location: PACU  Anesthesia Type:General  Level of Consciousness: awake  Airway & Oxygen Therapy: Patient Spontanous Breathing and Patient connected to nasal cannula oxygen  Post-op Assessment: Report given to PACU RN and Post -op Vital signs reviewed and stable  Post vital signs: Reviewed and stable  Complications: No apparent anesthesia complications

## 2013-12-19 NOTE — H&P (Signed)
  [facility]  History and Physical Update  The patient was interviewed and re-examined.  The patient's previous History and Physical has been reviewed and is unchanged from Dr. Adele Danickson's consult on: 12/13/13 except for improved pulmonary function.  There is no change in the plan of care: L BC AVF.  Leonides SakeBrian Chen, MD [facility] [place] Office: [phone] Pager: [phone]  12/19/2013, 10:19 AM

## 2013-12-19 NOTE — Anesthesia Preprocedure Evaluation (Addendum)
Anesthesia Evaluation  Patient identified by MRN, date of birth, ID band Patient awake    Reviewed: Allergy & Precautions, H&P , NPO status , Patient's Chart, lab work & pertinent test results  Airway Mallampati: II      Dental   Pulmonary neg pulmonary ROS,  breath sounds clear to auscultation        Cardiovascular hypertension, +CHF Rhythm:Regular Rate:Normal     Neuro/Psych    GI/Hepatic negative GI ROS, Neg liver ROS,   Endo/Other  diabetes  Renal/GU Renal disease     Musculoskeletal   Abdominal   Peds  Hematology   Anesthesia Other Findings   Reproductive/Obstetrics                         Anesthesia Physical Anesthesia Plan  ASA: III  Anesthesia Plan:    Post-op Pain Management:    Induction: Intravenous  Airway Management Planned: Simple Face Mask  Additional Equipment:   Intra-op Plan:   Post-operative Plan:   Informed Consent: I have reviewed the patients History and Physical, chart, labs and discussed the procedure including the risks, benefits and alternatives for the proposed anesthesia with the patient or authorized representative who has indicated his/her understanding and acceptance.   Dental advisory given  Plan Discussed with: CRNA, Anesthesiologist and Surgeon  Anesthesia Plan Comments:        Anesthesia Quick Evaluation

## 2013-12-19 NOTE — Progress Notes (Signed)
Subjective: Joan Young, Joan Young, and Joan Young were present at bedside. She was awaiting going to the OR this AM. I explained to her that she could go home today if she does not have any complications.  Objective: Vital signs in last 24 hours: Filed Vitals:   12/18/13 0605 12/18/13 1456 12/18/13 2111 12/19/13 0446  BP: 119/67 156/72 136/76 154/76  Pulse: 88 97 88 87  Temp: 98.4 F (36.9 C) 98.6 F (37 C) 98.4 F (36.9 C) 98.1 F (36.7 C)  TempSrc: Oral Oral Oral Oral  Resp: 18 18 18 18   Height:      Weight: 190 lb 7.6 oz (86.4 kg)   188 lb 0.8 oz (85.3 kg)  SpO2: 96% 100% 96% 100%   Weight change: -2 lb 6.8 oz (-1.1 kg)  Intake/Output Summary (Last 24 hours) at 12/19/13 1132 Last data filed at 12/19/13 1000  Gross per 24 hour  Intake    480 ml  Output   2200 ml  Net  -1720 ml   Vitals reviewed. General: resting in bed, 2L O2 by nasal cannula HEENT: PERRL, EOMI, no scleral icterus Cardiac: RRR, no rubs, murmurs or gallops Pulm: clear to auscultation bilaterally, no crackles appreciated Abd: soft, nontender, nondistended, BS present Ext: warm and well perfused, no pitting edema bilaterally Neuro: responds to questions appropriately; moving all extremities freely  Lab Results: Basic Metabolic Panel:  Recent Labs Lab 12/18/13 0504 12/19/13 0409  NA 138 137  K 3.3* 3.7  CL 92* 92*  CO2 30 28  GLUCOSE 165* 196*  BUN 77* 80*  CREATININE 6.51* 6.81*  CALCIUM 8.9 9.2  PHOS 7.0* 6.8*    CBC:  Recent Labs Lab 12/12/13 1351  12/18/13 0504 12/19/13 0409  WBC 8.9  < > 5.9 6.2  NEUTROABS 6.7  --   --   --   HGB 9.8*  < > 10.5* 11.3*  HCT 29.0*  < > 31.3* 33.4*  MCV 86.1  < > 85.8 86.1  PLT 252  < > 276 279  < > = values in this interval not displayed.  CBG:  Recent Labs Lab 12/18/13 0624 12/18/13 1053 12/18/13 1629 12/18/13 2107 12/19/13 0552 12/19/13 0941  GLUCAP 166* 127* 119* 331* 167* 136*   Medications: I have reviewed the patient's  current medications. Scheduled Meds: . [facility][MAR HOLD] aspirin EC  81 mg Oral Daily  . [facility][MAR HOLD] atorvastatin  40 mg Oral q1800  . [facility][MAR HOLD] calcitRIOL  0.25 mcg Oral Daily  . [facility][MAR HOLD] calcium acetate  667 mg Oral TID WC  . [MAR HOLD] cefUROXime (ZINACEF)  IV  1.5 g Intravenous On Call to OR  . [MAR HOLD] darbepoetin (ARANESP) injection - NON-DIALYSIS  40 mcg Subcutaneous Q14 Days  . [MAR HOLD] FLUoxetine  20 mg Oral Daily  . [facility][MAR HOLD] furosemide  160 mg Oral BID  . [facility][MAR HOLD] gabapentin  200 mg Oral Daily  . [MAR HOLD] heparin  5,000 Units Subcutaneous 3 times per day  . [facility][MAR HOLD] hydrALAZINE  12.5 mg Oral 3 times per day  . [MAR HOLD] insulin aspart  0-5 Units Subcutaneous QHS  . [MAR HOLD] insulin aspart  0-9 Units Subcutaneous TID WC  . [MAR HOLD] insulin aspart  5 Units Subcutaneous TID WC  . [MAR HOLD] insulin glargine  30 Units Subcutaneous QHS  . [facility][MAR HOLD] isosorbide dinitrate  10 mg Oral BID  . [facility][MAR HOLD] metolazone  2.5 mg Oral Q24H  . [facility][MAR HOLD] pantoprazole  40 mg Oral Daily   Continuous Infusions: . sodium chloride 10 mL/hr at 12/14/13 1307   PRN Meds:.0.9 % irrigation (POUR BTL), [MAR HOLD] acetaminophen, [MAR HOLD] acetaminophen, heparin 6000 unit irrigation, [MAR HOLD] polyethylene glycol Assessment/Plan:  Joan Young is a 53 year old Joan Young female with DM, HTN, combined systolic and diastolic dysfunction CHF (EF 40-45%), CKD IV, hospitalized for acute-on-chronic combined systolic and diastolic CHF & acute-on-chronic CKD Stage 4 with resolved hypokalemia.   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 -Continue daily weights, strict I&O -Continue Lasix 160mg  BID PO -Holding irbesartan in setting of AKI  Acute renal failure superimposed on stage 4 chronic kidney disease: Crt stable at 6. K 3.7, up from 3.4 yesterday. 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  -Continue Lantus 30u QHS, Novolog 5u TID, SSI-S -Continue gabapentin 200mg   Anemia in chronic kidney disease with component of iron deficency: Hb 11.3, up from 10.5. -Continue Aranesp 40mg  SQ q14d per renal recs  Secondary hyperparathyroidism: P 6.8 today, down from 7.0. -Continue Phoslo 667mg  TID with goal of P 5.5 -Continue Calcitriol 0.25mg   Hyperlipidemia: Treating empirically for nephrotic syndrome given her poorly controlled DM.  -Continue atorvastatin 40mg   Depression: Continue Prozac.  FEN:  -Diet: Renal/Carb Modified  DVT prophylaxis: heparin 5000 units subcutaneous  CODE STATUS: FULL CODE  Dispo: Disposition is deferred at this time, awaiting improvement of current medical problems.   The patient does have a current PCP (No Pcp Per Patient) and [facility]up [facility] after discharge.  The patient does not have transportation limitations that hinder transportation to clinic appointments.  .Services Needed at time of discharge: Y = Yes, Blank = No PT:   OT:   RN:   Equipment:   Other:     LOS: 7 days   Heywood Ilesushil Autumn Pruitt, MD 12/19/2013, 11:32 AM

## 2013-12-20 ENCOUNTER — Telehealth: Payer: Self-pay | Admitting: Vascular Surgery

## 2013-12-20 ENCOUNTER — Other Ambulatory Visit: Payer: Self-pay | Admitting: *Deleted

## 2013-12-20 DIAGNOSIS — N184 Chronic kidney disease, stage 4 (severe): Secondary | ICD-10-CM

## 2013-12-20 DIAGNOSIS — E785 Hyperlipidemia, unspecified: Secondary | ICD-10-CM

## 2013-12-20 DIAGNOSIS — N2581 Secondary hyperparathyroidism of renal origin: Secondary | ICD-10-CM

## 2013-12-20 DIAGNOSIS — I10 Essential (primary) hypertension: Secondary | ICD-10-CM

## 2013-12-20 DIAGNOSIS — N186 End stage renal disease: Secondary | ICD-10-CM

## 2013-12-20 DIAGNOSIS — N179 Acute kidney failure, unspecified: Secondary | ICD-10-CM

## 2013-12-20 DIAGNOSIS — D631 Anemia in chronic kidney disease: Secondary | ICD-10-CM

## 2013-12-20 DIAGNOSIS — J811 Chronic pulmonary edema: Secondary | ICD-10-CM

## 2013-12-20 DIAGNOSIS — Z01810 Encounter for preprocedural cardiovascular examination: Secondary | ICD-10-CM

## 2013-12-20 DIAGNOSIS — E1029 Type 1 diabetes mellitus with other diabetic kidney complication: Secondary | ICD-10-CM

## 2013-12-20 DIAGNOSIS — I5043 Acute on chronic combined systolic (congestive) and diastolic (congestive) heart failure: Principal | ICD-10-CM

## 2013-12-20 LAB — CBC
HCT: 32.2 % — ABNORMAL LOW (ref 36.0–46.0)
Hemoglobin: 10.8 g/dL — ABNORMAL LOW (ref 12.0–15.0)
MCH: 28.5 pg (ref 26.0–34.0)
MCHC: 33.5 g/dL (ref 30.0–36.0)
MCV: 85 fL (ref 78.0–100.0)
PLATELETS: 265 10*3/uL (ref 150–400)
RBC: 3.79 MIL/uL — AB (ref 3.87–5.11)
RDW: 13.7 % (ref 11.5–15.5)
WBC: 5.7 10*3/uL (ref 4.0–10.5)

## 2013-12-20 LAB — RENAL FUNCTION PANEL
Albumin: 2.3 g/dL — ABNORMAL LOW (ref 3.5–5.2)
Anion gap: 18 — ABNORMAL HIGH (ref 5–15)
BUN: 82 mg/dL — ABNORMAL HIGH (ref 6–23)
CHLORIDE: 92 meq/L — AB (ref 96–112)
CO2: 25 meq/L (ref 19–32)
Calcium: 9.1 mg/dL (ref 8.4–10.5)
Creatinine, Ser: 6.99 mg/dL — ABNORMAL HIGH (ref 0.50–1.10)
GFR calc Af Amer: 7 mL/min — ABNORMAL LOW (ref >90)
GFR calc non Af Amer: 6 mL/min — ABNORMAL LOW (ref >90)
Glucose, Bld: 142 mg/dL — ABNORMAL HIGH (ref 70–99)
Phosphorus: 7 mg/dL — ABNORMAL HIGH (ref 2.3–4.6)
Potassium: 3.6 mEq/L — ABNORMAL LOW (ref 3.7–5.3)
SODIUM: 135 meq/L — AB (ref 137–147)

## 2013-12-20 LAB — GLUCOSE, CAPILLARY
GLUCOSE-CAPILLARY: 127 mg/dL — AB (ref 70–99)
Glucose-Capillary: 167 mg/dL — ABNORMAL HIGH (ref 70–99)

## 2013-12-20 MED ORDER — SODIUM CHLORIDE 0.9 % IV BOLUS (SEPSIS): 500.0000 mL | Freq: Once | INTRAVENOUS | Status: DC

## 2013-12-20 MED ORDER — FUROSEMIDE 40 MG PO TABS: 40.0000 mg | ORAL_TABLET | Freq: Two times a day (BID) | ORAL | Status: DC

## 2013-12-20 MED ORDER — CALCIUM CARBONATE ANTACID 500 MG PO CHEW: 1.0000 | CHEWABLE_TABLET | Freq: Three times a day (TID) | ORAL | Status: DC

## 2013-12-20 MED ORDER — GABAPENTIN 100 MG PO CAPS: 200.0000 mg | ORAL_CAPSULE | Freq: Three times a day (TID) | ORAL | Status: DC

## 2013-12-20 MED ORDER — FLUOXETINE HCL 20 MG PO CAPS: 20.0000 mg | ORAL_CAPSULE | Freq: Every day | ORAL | Status: DC

## 2013-12-20 MED ORDER — HYDRALAZINE HCL 25 MG PO TABS: 12.5000 mg | ORAL_TABLET | Freq: Three times a day (TID) | ORAL | Status: DC

## 2013-12-20 MED ORDER — RANITIDINE HCL 150 MG PO TABS: 150.0000 mg | ORAL_TABLET | Freq: Two times a day (BID) | ORAL | Status: DC

## 2013-12-20 MED ORDER — LOVASTATIN 20 MG PO TABS: 20.0000 mg | ORAL_TABLET | Freq: Every day | ORAL | Status: DC

## 2013-12-20 MED ORDER — CALCIUM CARBONATE ANTACID 500 MG PO CHEW: 1.0000 | CHEWABLE_TABLET | Freq: Every day | ORAL | Status: DC

## 2013-12-20 NOTE — Telephone Encounter (Addendum)
Message copied by Rosalyn ChartersOUX, BONNIE A on Thu Dec 20, 2013  2:24 PM ------      Message from: [place]HES[place], [place] K      Created: Thu Dec 20, 2013  9:44 AM      Regarding: Schedule                   ----- Message -----         From: Fransisco HertzBrian L Chen, MD         Sent: 12/20/2013   9:21 AM           To: [facility] [address]            Michaelle CopasYAMILETH Cruz EastonRUZ-MORA      [id number]      07-27-1960            Change follow up to 4 weeks with repeat L arm vein mapping and L arm arterial duplex to evaluate location of brachial bifurcation and arterial size. ------  notified patient of change in appt. day/time, 01-18-14 3pm, with dr. Imogene Burnchen as per staff message

## 2013-12-20 NOTE — Progress Notes (Signed)
Subjective: No complaints this AM. She tells she was initially due to return to [place] tomorrow but now will stay with her daughter. She has no financial resources to pay for her medications but will be assisted by case management on the floor with getting assistance and [facility].   Objective: Vital signs in last 24 hours: Filed Vitals:   12/19/13 1430 12/19/13 1457 12/19/13 2135 12/20/13 0545  BP: 149/74 151/69 148/56 140/70  Pulse: 90 91 94 84  Temp: 99 F (37.2 C) 98.2 F (36.8 C) 98 F (36.7 C) 97.7 F (36.5 C)  TempSrc:   Oral Oral  Resp: 14  16 18   Height:      Weight:    187 lb 11.2 oz (85.14 kg)  SpO2: 99% 98% 94% 100%   Weight change: -5.6 oz (-0.16 kg)  Intake/Output Summary (Last 24 hours) at 12/20/13 0700 Last data filed at 12/20/13 0547  Gross per 24 hour  Intake    540 ml  Output   1325 ml  Net   -785 ml   Vitals reviewed. General: resting in bed, 2L O2 by nasal cannula HEENT: PERRL, EOMI, no scleral icterus Cardiac: RRR, no rubs, murmurs or gallops Pulm: clear to auscultation bilaterally, no crackles appreciated Abd: soft, nontender, nondistended, BS present Ext: warm and well perfused, no pitting edema bilaterally Neuro: responds to questions appropriately; moving all extremities freely  Lab Results: Basic Metabolic Panel:  Recent Labs Lab 12/19/13 0409 12/20/13 0457  NA 137 135*  K 3.7 3.6*  CL 92* 92*  CO2 28 25  GLUCOSE 196* 142*  BUN 80* 82*  CREATININE 6.81* 6.99*  CALCIUM 9.2 9.1  PHOS 6.8* 7.0*    CBC:  Recent Labs Lab 12/19/13 0409 12/20/13 0457  WBC 6.2 5.7  HGB 11.3* 10.8*  HCT 33.4* 32.2*  MCV 86.1 85.0  PLT 279 265    CBG:  Recent Labs Lab 12/19/13 0552 12/19/13 0941 12/19/13 1338 12/19/13 1619 12/19/13 2117 12/20/13 0538  GLUCAP 167* 136* 148* 109* 199* 127*   Medications: I have reviewed the patient's current medications. Scheduled Meds: . aspirin EC  81 mg  Oral Daily  . atorvastatin  40 mg Oral q1800  . calcitRIOL  0.25 mcg Oral Daily  . calcium acetate  667 mg Oral TID WC  . darbepoetin (ARANESP) injection - NON-DIALYSIS  40 mcg Subcutaneous Q14 Days  . FLUoxetine  20 mg Oral Daily  . furosemide  80 mg Oral BID  . gabapentin  200 mg Oral Daily  . heparin  5,000 Units Subcutaneous 3 times per day  . hydrALAZINE  12.5 mg Oral 3 times per day  . insulin aspart  0-5 Units Subcutaneous QHS  . insulin aspart  0-9 Units Subcutaneous TID WC  . insulin aspart  5 Units Subcutaneous TID WC  . insulin glargine  30 Units Subcutaneous QHS  . isosorbide dinitrate  10 mg Oral BID  . pantoprazole  40 mg Oral Daily   Continuous Infusions: . sodium chloride 10 mL/hr at 12/14/13 1307   PRN Meds:.acetaminophen, acetaminophen, polyethylene glycol Assessment/Plan:  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 -Continue daily  weights, strict I&O -Lasix decreased to 80mg  BID but will c -Holding irbesartan in setting of AKI  Acute renal failure superimposed on stage 4 chronic kidney disease: Crt stable at 6 though trending upwards. K 3.6, down from 3.7 yesterday. -Give 500cc NS bolus -Schedule follow-up with nephrology -Nephrology following, appreciated recommendations  Essential hypertension: BP 140-160/60-70 yesterday though 140/70 this AM. 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  -Continue Lantus 30u QHS, Novolog 5u TID, SSI-S -Continue gabapentin 200mg   Anemia in chronic kidney disease with component of iron deficency: Hb 10, down from 11. -Continue  Aranesp 40mg  SQ q14d per renal recs  Secondary hyperparathyroidism: P 7.0 today, stable. -Continue Phoslo 667mg  TID with goal of P 5.5 -Continue Calcitriol 0.25mg   Hyperlipidemia: Treating empirically for nephrotic syndrome given her poorly controlled DM.  -Continue atorvastatin 40mg   Depression: Continue Prozac.  FEN:  -Diet: Renal/Carb Modified  DVT prophylaxis: heparin 5000 units subcutaneous  CODE STATUS: FULL CODE  Dispo: Disposition is deferred at this time, awaiting improvement of current medical problems.   The patient does have a current PCP (No Pcp Per Patient) and does not need an [facility] follow[facility] appointment after discharge.  The patient does not have transportation limitations that hinder transportation to clinic appointments.  .Services Needed at time of discharge: Y = Yes, Blank = No PT:   OT:   RN:   Equipment:   Other:     LOS: 8 days   Heywood Ilesushil Conswella Bruney, MD 12/20/2013, 7:00 AM

## 2013-12-20 NOTE — Progress Notes (Signed)
Patient ID: Joan Young Joan Young Young, female    DOB: 1960-08-01, 53 y.o.   MRN: [id number]  S: Doing well, mild pain at fistula site. Breathing is fine.  O:BP 134/70  Pulse 86  Temp(Src) 97.8 F (36.6 Young) (Oral)  Resp 16  Ht 5\' 1"  (1.549 m)  Wt 187 lb 11.2 oz (85.14 kg)  BMI 35.48 kg/m2  SpO2 97%  Intake/Output Summary (Last 24 hours) at 12/20/13 0924 Last data filed at 12/20/13 0835  Gross per 24 hour  Intake    780 ml  Output   1325 ml  Net   -545 ml   Intake/Output: I/O last 3 completed shifts: In: 540 [P.O.:240; I.V.:300] Out: 2275 [Urine:2250; Blood:25]  Intake/Output this shift:  Total I/O In: 240 [P.O.:240] Out: -  Weight change: -5.6 oz (-0.16 kg)  Gen:NAD, able to move herself on the bed easily CVS:RRR, no mrg Resp: CTAB Abd: soft, obese, +BS Ext: trace edema, WWP.   Recent Labs Lab 12/14/13 0400 12/15/13 0452 12/16/13 0400 12/17/13 0430 12/17/13 1340 12/18/13 0504 12/19/13 0409 12/20/13 0457  NA 137 137 140 140 137 138 137 135*  K 4.0 3.7 3.8 3.4* 3.8 3.3* 3.7 3.6*  CL 95* 96 97 95* 92* 92* 92* 92*  CO2 23 26 29 30 27 30 28 25   GLUCOSE 131* 177* 176* 83 204* 165* 196* 142*  BUN 83* 79* 80* 77* 73* 77* 80* 82*  CREATININE 6.34* 6.44* 6.35* 6.42* 6.35* 6.51* 6.81* 6.99*  ALBUMIN  --   --   --   --   --  2.3* 2.3* 2.3*  CALCIUM 7.7* 7.8* 7.9* 8.7 8.6 8.9 9.2 9.1  PHOS  --  6.7*  --   --   --  7.0* 6.8* 7.0*   Liver Function Tests:  Recent Labs Lab 12/18/13 0504 12/19/13 0409 12/20/13 0457  ALBUMIN 2.3* 2.3* 2.3*   No results found for this basename: LIPASE, AMYLASE,  in the last 168 hours No results found for this basename: AMMONIA,  in the last 168 hours CBC:  Recent Labs Lab 12/16/13 0400 12/17/13 0430 12/18/13 0504 12/19/13 0409 12/20/13 0457  WBC 5.3 6.1 5.9 6.2 5.7  HGB 8.6* 9.5* 10.5* 11.3* 10.8*  HCT 25.3* 28.7* 31.3* 33.4* 32.2*  MCV 84.3 83.9 85.8 86.1 85.0  PLT 231 266 276 279 265   Cardiac Enzymes:  Recent Labs Lab  12/13/13 2130  TROPONINI <0.30   CBG:  Recent Labs Lab 12/19/13 0941 12/19/13 1338 12/19/13 1619 12/19/13 2117 12/20/13 0538  GLUCAP 136* 148* 109* 199* 127*    Iron Studies: No results found for this basename: IRON, TIBC, TRANSFERRIN, FERRITIN,  in the last 72 hours Studies/Results: No results found. Marland Kitchen. aspirin EC  81 mg Oral Daily  . atorvastatin  40 mg Oral q1800  . calcitRIOL  0.25 mcg Oral Daily  . calcium acetate  667 mg Oral TID WC  . darbepoetin (ARANESP) injection - NON-DIALYSIS  40 mcg Subcutaneous Q14 Days  . FLUoxetine  20 mg Oral Daily  . furosemide  80 mg Oral BID  . gabapentin  200 mg Oral Daily  . heparin  5,000 Units Subcutaneous 3 times per day  . hydrALAZINE  12.5 mg Oral 3 times per day  . insulin aspart  0-5 Units Subcutaneous QHS  . insulin aspart  0-9 Units Subcutaneous TID WC  . insulin aspart  5 Units Subcutaneous TID WC  . insulin glargine  30 Units Subcutaneous QHS  . isosorbide dinitrate  10 mg Oral BID  . pantoprazole  40 mg Oral Daily    BMET    Component Value Date/Time   NA 135* 12/20/2013 0457   K 3.6* 12/20/2013 0457   CL 92* 12/20/2013 0457   CO2 25 12/20/2013 0457   GLUCOSE 142* 12/20/2013 0457   BUN 82* 12/20/2013 0457   CREATININE 6.99* 12/20/2013 0457   CALCIUM 9.1 12/20/2013 0457   GFRNONAA 6* 12/20/2013 0457   GFRAA 7* 12/20/2013 0457   CBC    Component Value Date/Time   WBC 5.7 12/20/2013 0457   RBC 3.79* 12/20/2013 0457   HGB 10.8* 12/20/2013 0457   HCT 32.2* 12/20/2013 0457   PLT 265 12/20/2013 0457   MCV 85.0 12/20/2013 0457   MCH 28.5 12/20/2013 0457   MCHC 33.5 12/20/2013 0457   RDW 13.7 12/20/2013 0457   LYMPHSABS 1.4 12/12/2013 1351   MONOABS 0.5 12/12/2013 1351   EOSABS 0.3 12/12/2013 1351   BASOSABS 0.0 12/12/2013 1351     Assessment/Plan: 53 y.o. female with PMH CKD, HTN, DM, CHF; admitted 10/14 for SOB and fluid overload, treated as CHF exacerbation. [place]   1. AKI & CKD vs. Progressive CKD stage 4 related to DM/HTN and CHF- For now goal is to improve CHF and hopefully improve cardiorenal syndrome (per pt was told her GFR was 25% 5 months ago). Scr trending upward. May represent overdiuresis, would recommend a gentle 500cc NS bolus. 2. CHF acute on chronic: net -13 L. Hold lasix today. 3. Anemia of chronic disease: stable, on aranesp. 4. Vascular access: appreciate VVS assistance. AVF 10/21 however not hopeful it will be usable and planning on additional access.  5. CKD-MBD: iPTH elevated at 521, Ca normal, Phos elevated 7.0. On calcitriol and phoslo. 6. Hypokalemia: improved with repletion 7. HTN: variable  8. DM: per primary svc  Appt with Joan Young Joan Young Young on Nov 18th at 1:30 PM. At Joan Young Joan Young Young  Joan Young Joan Young Young, Joan Young Joan Young Young  Joan Young Joan Young Young,Joan Young Joan Young Young

## 2013-12-20 NOTE — Progress Notes (Signed)
Interpreter Graciela Namihira for patient °

## 2013-12-20 NOTE — Progress Notes (Signed)
Renal More azotemic. Will stop diuretics.  I think she is over diuresed.  Appt with Dr. Hyman HopesWebb on Nov 18th at 1:30 PM. At [facility]. Joan Young

## 2013-12-20 NOTE — Progress Notes (Addendum)
  [facility] Progress Note  12/20/2013 8:39 AM 1 Day Post-Op  Subjective:  Denies any pain in her left hand.   Tmax 99 BP sys 120s-160s 02 100%  Filed Vitals:   12/20/13 0545  BP: 140/70  Pulse: 84  Temp: 97.7 F (36.5 C)  Resp: 18    Physical Exam: Incisions:  Left antecubital incision c/d/i. No hematoma.  Extremities:  Palpable pulse in fistula. No audible bruit. 2+ left radial pulse. Hand is warm and pink. Motor and sensory intact.   CBC    Component Value Date/Time   WBC 5.7 12/20/2013 0457   RBC 3.79* 12/20/2013 0457   HGB 10.8* 12/20/2013 0457   HCT 32.2* 12/20/2013 0457   PLT 265 12/20/2013 0457   MCV 85.0 12/20/2013 0457   MCH 28.5 12/20/2013 0457   MCHC 33.5 12/20/2013 0457   RDW 13.7 12/20/2013 0457   LYMPHSABS 1.4 12/12/2013 1351   MONOABS 0.5 12/12/2013 1351   EOSABS 0.3 12/12/2013 1351   BASOSABS 0.0 12/12/2013 1351    BMET    Component Value Date/Time   NA 135* 12/20/2013 0457   K 3.6* 12/20/2013 0457   CL 92* 12/20/2013 0457   CO2 25 12/20/2013 0457   GLUCOSE 142* 12/20/2013 0457   BUN 82* 12/20/2013 0457   CREATININE 6.99* 12/20/2013 0457   CALCIUM 9.1 12/20/2013 0457   GFRNONAA 6* 12/20/2013 0457   GFRAA 7* 12/20/2013 0457    INR No results found for this basename: inr     Intake/Output Summary (Last 24 hours) at 12/20/13 0839 Last data filed at 12/20/13 0835  Gross per 24 hour  Intake    780 ml  Output   1325 ml  Net   -545 ml     Assessment:  53 y.o. female is s/p: left first stage brachial vein transposition (brachiobrachial arteriovenous fistula) placement.   1 Day Post-Op  Plan: -No signs or symptoms of steal syndrome.  -No palpable thrill or audible bruit today on exam. She had a weak thrill intraoperatively yesterday with a pulsatile component to doppler signal.  Her fistula is likely in the process of clotting.  -No intervention planned at this time.   -Dr. Imogene Burnhen to evaluate patient.     Maris BergerKimberly Trinh, PA-C [facility] Office: [phone] Pager: [phone] 12/20/2013 8:39 AM  Addendum  I have independently interviewed and examined the patient, and I agree with the physician assistant's findings.  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  Artery was too friable to attempt another fistula intraoperative.  Once she heals up, will bring her back to re-evaluation for new access.  Leonides SakeBrian Chen, MD [facility] [place] Office: [phone] Pager: [phone]  12/20/2013, 9:20 AM

## 2013-12-20 NOTE — Progress Notes (Signed)
Pt ambulated hallway on room air, oxygen stats stayed between 97-100%.  Pt not complaining of any discomfort, will continue to monitor.

## 2013-12-20 NOTE — Telephone Encounter (Addendum)
Message copied by Rosalyn ChartersOUX, BONNIE A on Thu Dec 20, 2013  9:36 AM ------      Message from: Lorin MercyMCCHESNEY, MARILYN K      Created: Wed Dec 19, 2013  2:05 PM      Regarding: Schedule                   ----- Message -----         From: Dara LordsSamantha J Rhyne, PA-C         Sent: 12/19/2013   1:29 PM           To: Vvs Charge Pool            S/p left brachiocephalic AVF 12/19/13.  F/u with Dr. Imogene Burnhen in 6 weeks.            Thanks,      Lelon MastSamantha ------  notified patient of post op appointment with dr. Imogene Burnchen on 02-01-14 8:30am

## 2013-12-20 NOTE — Progress Notes (Signed)
  Date: 12/20/2013  Patient name: Joan Young  Medical record number: [id number]  Date of birth: 10/09/1960   This patient has been seen and the plan of care was discussed with the house staff. Please see their note for complete details. I concur with their findings with the following additions/corrections:  Patient is stable for discharge today.  She is not requiring any oxygen with ambulation.  Unfortunately, it looks like her graft will not work in the longterm, however, follow up with vascular surgery is arranged.  Per Nephrology notes, they recommend holding her diuretics today as she had a bump in her creatinine.  I think she should still be able to be discharged [facility] follow up of her labs and decision to restart lasix.    Joan CatalinaEmily B Treniyah Lynn, MD 12/20/2013, 3:09 PM

## 2013-12-20 NOTE — Discharge Summary (Signed)
Name: Joan Young MRN: [id number] DOB: 1960-05-30 53 y.o. 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, uncontrolled   Hyperlipidemia   Essential hypertension   Pulmonary edema   Acute renal failure superimposed on stage 4 chronic kidney disease   Anemia in chronic kidney disease   Secondary hyperparathyroidism of renal origin  Discharge Medications:   Medication List    STOP taking these medications       gemfibrozil 600 MG tablet  Commonly known as:  LOPID     irbesartan 150 MG tablet  Commonly known as:  AVAPRO     methyldopa 250 MG tablet  Commonly known as:  ALDOMET     omeprazole 10 MG capsule  Commonly known as:  PRILOSEC     verapamil 80 MG tablet  Commonly known as:  CALAN      TAKE these medications       aspirin 81 MG tablet  Take 81 mg by mouth daily.     calcium carbonate 500 MG chewable tablet  Commonly known as:  TUMS  Chew 1 tablet (200 mg of elemental calcium total) by mouth 3 (three) times daily with meals.     FLUoxetine 20 MG capsule  Commonly known as:  PROZAC  Take 1 capsule (20 mg total) by mouth daily.     furosemide 40 MG tablet  Commonly known as:  LASIX  Take 1 tablet (40 mg total) by mouth 2 (two) times daily.     gabapentin 100 MG capsule  Commonly known as:  NEURONTIN  Take 2 capsules (200 mg total) by mouth 3 (three) times daily.     hydrALAZINE 25 MG tablet  Commonly known as:  APRESOLINE  Take 0.5 tablets (12.5 mg total) by mouth 3 (three) times daily.     insulin NPH Human 100 UNIT/ML injection  Commonly known as:  HUMULIN N,NOVOLIN N  Inject 8-24 Units into the skin 2 (two) times daily. 8 units in the morning and 24 units at  after supper     insulin regular 100 units/mL injection  Commonly known as:  NOVOLIN R,HUMULIN R  Inject 10-16 Units into the skin 3 (three) times daily before meals. 16 units every morning 16 units at lunch 10 units at bedtime.  Per sliding scale     isosorbide dinitrate 20 MG tablet  Commonly known as:  ISORDIL  Take 10 mg by mouth daily.     lovastatin 20 MG tablet  Commonly known as:  MEVACOR  Take 1 tablet (20 mg total) by mouth at bedtime.     ranitidine 150 MG tablet  Commonly known as:  ZANTAC  Take 1 tablet (150 mg total) by mouth 2 (two) times daily.        Disposition and follow-up:   Ms.Jaria Sondra ComeCruz Young [facility] St[facility].  At the hospital follow up visit please address:  1.  CHF: adjusting Lasix dose, need for a b-blocker  2.  DM2: adjusting insulin dose  3.  Financial resources to pay for medications  4.  Iron deficiency: Aranesp q14d (next due at 11/1)  5.  Hyperphosphatemia: adjusting Phoslo dose  6.  Ranitidine: adjust dose by renal function  7.  Labs / imaging needed at time of follow-up: BMET  8.  Pending labs/ test needing follow-up: none  Follow-up Appointments: Follow-up Information   Follow up with Nilda Simmer, MD On 02/01/2014. (@8 :30 am spoke with [facility])    Specialty:  Vascular Surgery   Contact information:   [address] [place] [address] [phone]       Follow up with [facility]     On 12/20/2013.   Contact information:   [address] [place] [address] [phone]      Follow up with [facility]. (RN)    Contact information:   [address] [place] [address] [phone]       Discharge Instructions:   Consultations: Treatment Team:  Trevor Iha, MD Garnetta Buddy, MD  Procedures Performed:  Dg Chest 2 View  12/12/2013   CLINICAL DATA:  53 year old female acute chest  pain shortness of breath weakness headache. Initial encounter.  EXAM: CHEST  2 VIEW  COMPARISON:  10/15/2004 and earlier.  FINDINGS: Diffuse increased interstitial markings. Small pleural effusions and fluid in the fissures. Indistinctness of pulmonary vasculature. Cardiac size is mildly increased, but is at the upper limits of normal. Other mediastinal contours are within normal limits. Visualized tracheal air column is within normal limits. No pneumothorax. No acute osseous abnormality identified.  IMPRESSION: Acute pulmonary edema with small bilateral pleural effusions.   Electronically Signed   By: Augusto Gamble M.D.   On: 12/12/2013 14:52   US Renal  12/13/2013   CLINICAL DATA:  Renal failure.  Initial encounter.  EXAM: RENAL/URINARY TRACT ULTRASOUND COMPLETE  COMPARISON:  None.  FINDINGS: Right Kidney:  Length: 11 cm. Echogenicity within normal limits. No mass or hydronephrosis visualized.  Left Kidney:  Length: 11 cm. Echogenicity within normal limits. No mass or hydronephrosis visualized.  Bladder:  Appears normal for degree of bladder distention.  Incidental small right pleural effusion.  IMPRESSION: 1. Negative renal ultrasound. 2. Small right pleural effusion.   Electronically Signed   By: Tiburcio Pea M.D.   On: 12/13/2013 06:54   Dg Chest Port 1 View  12/14/2013   CLINICAL DATA:  Shortness of breath for 1 day.  EXAM: PORTABLE CHEST - 1 VIEW  COMPARISON:  Single view of the chest 12/13/2013.  FINDINGS: Interstitial pulmonary edema seen on the comparison study has markedly improved. There are small to moderate pleural effusions and basilar atelectasis, worse on the left. Cardiomegaly is noted.  IMPRESSION: Marked improvement in pulmonary edema with residual small to moderate effusions and basilar atelectasis, worse on the left.   Electronically Signed   By: Drusilla Kanner M.D.   On: 12/14/2013 15:19   Dg Chest Port 1 View  12/13/2013   CLINICAL DATA:  Shortness of breath.  Initial encounter.   EXAM: PORTABLE CHEST - 1 VIEW  COMPARISON:  12/12/2013; 10/15/2004  FINDINGS: Grossly unchanged enlarged cardiac silhouette and mediastinal contours with atherosclerotic plaque within the thoracic aorta. Pulmonary vasculature is less distinct with cephalization of flow. Interval increase in size of small bilateral effusions and associated worsening bibasilar heterogeneous/consolidative opacities, left greater than right. No pneumothorax. Unchanged bones.  IMPRESSION: Findings compatible with worsening pulmonary edema, small bilateral effusions with associated bibasilar opacities, left greater than right, atelectasis versus infiltrate.   Electronically Signed   By: Simonne Come M.D.   On: 12/13/2013 20:58   2D Echo:  - Left ventricle: There is diffuse hypokinesis, more  pronounced in the apical inferior, septal walls and mid inferior and inferolateral walls. The cavity size was normal. There was moderate concentric hypertrophy. Systolic function was mildly to moderately reduced. The estimated ejection fraction was in the range of 40% to 45%. Doppler parameters are consistent with abnormal left ventricular relaxation (grade 1 diastolic dysfunction). Doppler parameters are consistent with elevated ventricular end-diastolic filling pressure. - Aortic valve: Trileaflet; normal thickness leaflets. Transvalvular velocity was within the normal range. There was no stenosis. There was trivial regurgitation. - Aortic root: The aortic root was normal in size. - Mitral valve: Structurally normal valve. There was mild regurgitation. - Left atrium: The atrium was normal in size. - Right ventricle: Systolic function was normal. - Tricuspid valve: There was mild regurgitation. - Pulmonic valve: There was no regurgitation. - Pulmonary arteries: PA peak pressure: 32 mm Hg (S). - Inferior vena cava: The vessel was normal in size. - Pericardium, extracardiac: A trivial pericardial effusion was identified posterior to the  heart. Features were not consistent with tamponade physiology.  Admission HPI: 54 yo female with hx of HTN, DM, depression, HLD, CKD (baseline GFR ~comes with with SOB and chest pressure for 5 days. She didn't have SOB in the past but started 5 days ago. Has orthopnea and nocturnal cough. No recent cold or signs of respiratory problems. Her chest pressure is intermittent, lasts ~3-4 mins. Today she got the chest pressure again when she was walking with some radiation to her left arm. It resolved spontaneously after 5 mins. Has BLE edema which is new and also feels abdomen is larger. Having good BM without problem. Doesn't make too much urine but that's baseline.  No fever/chills/n/v/diarrhea. Had some confusion yesterday per family but patient feels well now.   Patient is visiting family in [place]. Spanish speaking only, daughter translated. Gets medical care at [place]. Was told she has ischemic cardiomyopathy, kidney abnormality from DM and HTN.   Denies smoking, alcohol use, or other drug use.  diueresed in the ED with 40mg  IV lasix. CXR showed pulmonary edema and bilateral pleural effusion.   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 She was asked to hold irbesartan at the time discharge until she could establish care to follow-up labs.  DM, Type 1: A1c on admission was 8.3. She was transitioned to Lantus 30u QHS, Novolog 5u TID, and SSI-sensitive given persistent  hyperglycemia. Gabapentin dose was decreased due to her renal function from 300mg  TID->200mg  BID. At the time of discharge, she resumed her home regimen due to the costs associated with insulin which will need to be addressed at follow-up.  Acute-on-chronic kidney disease stage 4: Crt on admission was 6. She was diuresed as described above with nephrology's recommendations and had an AV fistula placed for future dialysis. Vascular surgery evaluated her post-op and assessed the graft to be occluded. She is scheduled to follow-up with the surgeon for a revision but will need follow-up with nephrology.  HTN: Methyldopa was stopped given it's contraindications  in renal disease along with verapamil. She was started on hydralazine, and her dose was titrated until she was normotensive. Consider starting beta-blocker at follow-up appointment given her co-morbidities.  Hyperlipidemia: Lopid was stopped, and she was started on atorvastatin 40mg .   Anemia in chronic kidney disease: Iron panel results are shown below. She was given IV Ferraheme followed by Aranesp.    Iron/TIBC/Ferritin/ %Sat    Component Value Date/Time   IRON 28* 12/13/2013 1634   TIBC 220* 12/13/2013 1634   IRONPCTSAT 13* 12/13/2013 1634   Secondary hyperparathoidism of renal origin: She was started on Phosplo 667mg  given elevated phosphate. P remained elevated at 6-7 and will need to be addressed at follow-up.  Discharge Vitals:   BP 134/70  Pulse 86  Temp(Src) 97.8 F (36.6 C) (Oral)  Resp 16  Ht 5\' 1"  (1.549 m)  Wt 187 lb 11.2 oz (85.14 kg)  BMI 35.48 kg/m2  SpO2 97%  Discharge Labs:  Results for orders placed during the hospital encounter of 12/12/13 (from the past 24 hour(s))  GLUCOSE, CAPILLARY     Status: Abnormal   Collection Time    12/19/13  1:38 PM      Result Value Ref Range   Glucose-Capillary 148 (*) 70 - 99 mg/dL  GLUCOSE, CAPILLARY     Status: Abnormal   Collection Time    12/19/13  4:19 PM      Result  Value Ref Range   Glucose-Capillary 109 (*) 70 - 99 mg/dL  GLUCOSE, CAPILLARY     Status: Abnormal   Collection Time    12/19/13  9:17 PM      Result Value Ref Range   Glucose-Capillary 199 (*) 70 - 99 mg/dL   Comment 1 Notify RN     Comment 2 Documented in Chart    RENAL FUNCTION PANEL     Status: Abnormal   Collection Time    12/20/13  4:57 AM      Result Value Ref Range   Sodium 135 (*) 137 - 147 mEq/L   Potassium 3.6 (*) 3.7 - 5.3 mEq/L   Chloride 92 (*) 96 - 112 mEq/L   CO2 25  19 - 32 mEq/L   Glucose, Bld 142 (*) 70 - 99 mg/dL   BUN 82 (*) 6 - 23 mg/dL   Creatinine, Ser 4.096.99 (*) 0.50 - 1.10 mg/dL   Calcium 9.1  8.4 - 81.110.5 mg/dL   Phosphorus 7.0 (*) 2.3 - 4.6 mg/dL   Albumin 2.3 (*) 3.5 - 5.2 g/dL   GFR calc non Af Amer 6 (*) >90 mL/min   GFR calc Af Amer 7 (*) >90 mL/min   Anion gap 18 (*) 5 - 15  CBC     Status: Abnormal   Collection Time    12/20/13  4:57 AM      Result Value Ref Range   WBC 5.7  4.0 - 10.5 K/uL   RBC 3.79 (*) 3.87 - 5.11 MIL/uL   Hemoglobin 10.8 (*) 12.0 - 15.0 g/dL   HCT 91.432.2 (*) 78.236.0 - 95.646.0 %   MCV 85.0  78.0 - 100.0 fL   MCH 28.5  26.0 - 34.0 pg   MCHC 33.5  30.0 - 36.0 g/dL   RDW 21.313.7  08.611.5 - 57.815.5 %   Platelets 265  150 - 400 K/uL  GLUCOSE, CAPILLARY     Status: Abnormal   Collection Time    12/20/13  5:38 AM      Result Value  Ref Range   Glucose-Capillary 127 (*) 70 - 99 mg/dL  GLUCOSE, CAPILLARY     Status: Abnormal   Collection Time    12/20/13 11:19 AM      Result Value Ref Range   Glucose-Capillary 167 (*) 70 - 99 mg/dL    Signed: Heywood Iles, MD 12/20/2013, 11:33 AM    Services Ordered on Discharge: none Equipment Ordered on Discharge: none

## 2013-12-20 NOTE — Discharge Instructions (Signed)
Thank you for trusting [place] with your medical care!  You were hospitalized for acute on chronic heart failure and acute on chronic kidney disease. You were treated with a diuretic (Lasix) to help you lose fluid.   Please follow-up with the [facility] as early as possible as they will help you with affording your medications.   Please weigh yourself daily and record your weight. If you go up by 3 lbs in one day OR 5 lbs in one week, please call your doctor.   ALL of your medications can be picked up at [facility] and have been sent electronically to there; below are the ones that are there.  Lovastatin (cholesterol) Hydralazine (blood pressure) Ranitidine (acid reflux) Tums (calcium) Lasix (diuretic) Prozac (depression) Gabapentin (diabetic pain).  Insuficiencia cardaca (Heart Failure) La insuficiencia cardaca es una afeccin en la que el corazn tiene dificultad para bombear la [place]. El corazn no bombea sangre de [place] eficiente para que el organismo pueda funcionar bien. En algunos casos de insuficiencia cardaca, los lquidos vuelven a los pulmones, o puede haber hinchazn (edema) en la zona inferior de las piernas. La insuficiencia cardaca por lo general es una enfermedad de larga duracin (crnica). Es importante que se cuide mucho y que siga el plan de tratamiento que le indique su mdico. CAUSAS  Algunas enfermedades y condiciones pueden causar insuficiencia cardaca. Estas incluyen lo siguiente:  Presin arterial elevada (hipertensin). La hipertensin hace que el msculo cardaco trabaje ms de lo normal. Cuando la presin en los vasos sanguneos es alta, el corazn tiene que bombear (contraerse) con ms fuerza para Insurance account manager la sangre por todo el cuerpo. La hipertensin arterial hace que con el tiempo el corazn se vuelva rgido y dbil.  Enfermedad arterial coronaria (EAC). La enfermedad arterial coronaria es la acumulacin de colesterol y grasa  (placa) en las arterias del corazn. La obstruccin de las arterias priva al msculo cardaco de sangre y oxgeno. Esto ocasiona dolor en el pecho y puede conducir a un infarto. La hipertensin arterial tambin favorece la enfermedad arterial coronaria.  Ataque cardaco (infarto de miocardio). El ataque cardaco se produce cuando se obstruye una o ms arterias del corazn. La prdida de oxgeno daa el tejido muscular cardaco. Cuando esto ocurre, una parte del msculo cardaco muere. El tejido daado no se contrae bien y Scientist, research (life sciences) la capacidad del corazn para Insurance account manager.  Vlvulas cardacas anormales. Cuando las vlvulas cardacas no se abren y cierran como corresponde, pueden causar insuficiencia cardaca. Esto hace que el msculo cardaco tenga que bombear con ms intensidad para que la [place].  Enfermedad del msculo cardaco (miocardiopata o miocarditis). En esta enfermedad, el msculo cardaco est daado por diversas causas. Entre ellas se encuentran el consumo de alcohol o drogas, las infecciones o pueden ser causas desconocidas. Todas estas causas aumentan el riesgo de insuficiencia cardaca.  Enfermedad pulmonar. Enfermedad pulmonar que hace que el corazn se esfuerce ms porque los pulmones no funcionan correctamente. Esto puede hacer que el corazn se tensione, y causar insuficiencia.  Diabetes. La diabetes aumenta el riesgo de insuficiencia cardaca. El nivel elevado de azcar en la sangre contribuye a aumentar los [place] de grasas (lpidos) en la [place]. La diabetes tambin favorece que lentamente se daen los pequeos vasos sanguneos que transportan nutrientes importantes al el msculo cardaco. Cuando el corazn no obtiene oxgeno y alimento suficiente, se debilita y se torna rgido. Por lo tanto no se contrae de Firefighter.  Hay otras enfermedades  y condiciones que pueden contribuir a la insuficiencia cardaca. Entre ellas se incluyen el ritmo cardaco anormal, los  problemas de tiroides y los recuentos bajos de glbulos rojos (anemia). Determinadas conductas perjudiciales aumentan el riesgo de insuficiencia cardaca, por ejemplo:  Tener sobrepeso.  Fumar o mascar tabaco.  Consumir alimentos ricos en grasas y colesterol.  Abusar de las drogas ilcitas o del alcohol.  La falta de actividad fsica. SNTOMAS  Los sntomas pueden variar y ser difciles de Engineer, manufacturing. Los sntomas pueden incluir:  Falta de aire al realizar actividades como subir escaleras.  Tos persistente.  Hinchazn de los pies, tobillos, piernas o abdomen.  Aumento de peso sin motivo.  Dificultad para respirar al estar acostado (ortopnea).  Despertarse con la necesidad de sentarse y tomar aire.  Latidos cardacos rpidos.  Fatiga y prdida de Engineer, drilling.  Mareos, o sensacin de desmayo o desvanecimiento.  Prdida del apetito.  Nuseas.  Orina con ms frecuencia durante la noche (nocturia). DIAGNSTICO  El diagnstico se realiza segn la historia clnica, los sntomas, el examen fsico y las pruebas diagnsticas. Las pruebas diagnsticas son:  Science writer.  Electrocardiograma.  Radiografa de trax.  Anlisis de [place].  Prueba de esfuerzo.  Angiografa cardaca.  Gammagrafa. TRATAMIENTO  El tratamiento est destinado al control de los sntomas. Podr ser necesario que tome medicamentos, realice cambios en su estilo de vida o se someta a una intervencin [place] para tratar la insuficiencia cardaca.  Los medicamentos para el tratamiento de la insuficiencia cardaca son:  Inhibidores de la enzima convertidora de angiotensina (IECA). Este tipo de Automatic Data bloquea los efectos de una protena llamada enzima convertidora de angiotensina. Los inhibidores de la ECA Banker (dilatan) los vasos sanguneos y ayudan a Technical sales engineer la presin arterial.  Bloqueadores de los receptores de angiotensina [place]). Este tipo de medicamento bloquea las acciones de una  protena de la sangre llamada angiotensina. Los bloqueadores de los receptores de angiotensina dilatan los vasos sanguneos y [place] a reducir la presin arterial.  Medicamentos para eliminar los lquidos (diurticos). Los diurticos [employer] los riones eliminen la sal y el agua de la [place]. El lquido en exceso es eliminado con la [place]. Esta eliminacin del exceso de lquido disminuye el volumen de sangre que el corazn bombea.  Betabloqueantes. Los betabloqueantes evitan que el corazn palpite demasiado rpido y mejoran la fuerza del msculo cardaco.  Digitlicos. Aumentan la fuerza de los latidos cardacos.  Los [employer] un estilo de vida saludable incluyen:  Barista y Pharmacologist un peso saludable.  Dejar de fumar o mascar tabaco.  Consumir alimentos cardiosaludables.  Limitar o evitar el alcohol.  Dejar de consumir drogas ilcitas.  Hacer actividad fsica segn las indicaciones de su mdico.  Los tratamientos quirrgicos para la insuficiencia cardaca pueden ser:  Un procedimiento para abrir las arterias obstruidas, reparacin de vlvulas cardacas daadas o la extirpacin del tejido muscular cardaco daado.  La colocacin de un marcapasos para ayudar al funcionamiento del msculo cardaco y para controlar ciertos ritmos cardacos anormales.  Un desfibrilador cardioversor interno para tratar determinados ritmos cardacos graves anormales.  Un dispositivo de asistencia ventricular izquierda (DAVI) para ayudar a que el corazn [employer]. INSTRUCCIONES PARA EL CUIDADO EN EL HOGAR   Tome los medicamentos solamente como se lo haya indicado el mdico. Los medicamentos son importantes para reducir la carga de trabajo del corazn, disminuir la progresin de la insuficiencia cardaca y Scientist, clinical (histocompatibility and immunogenetics) los sntomas.  No deje de tomar los medicamentos, excepto si se lo indica su mdico.  No se saltee ninguna dosis de los medicamentos.  Pida una nueva receta antes de quedarse sin  medicamentos. Es necesario que tome sus medicamentos todos Hagermanlos das.  Haga actividad fsica moderada si se lo indica su mdico. La actividad fsica moderada puede beneficiar a International aid/development workeralgunas personas. Los ancianos y las personas con insuficiencia cardaca grave deben consultarle a su mdico qu actividades fsicas son recomendables para ellos.  Consuma alimentos cardiosaludables. Los alimentos no deben incluir grasas trans y deben tener bajo contenido de grasas saturadas, colesterol y sal (sodio). Las opciones saludables incluyen frutas frescas o congeladas y verduras, pescado, carnes magras, legumbres, productos lcteos descremados o bajos en grasa y cereales integrales o alimentos con alto contenido de Orickfibra. Hable con un nutricionista para aprender ms sobre los alimentos cardiosaludables.  Limite el sodio si se lo indica su mdico. La restriccin de sodio puede reducir los sntomas de insuficiencia cardaca en Time Warneralgunas personas. Hable con un nutricionista para aprender ms sobre los condimentos cardiosaludables.  Use mtodos de coccin saludables. Los mtodos de coccin saludables incluyen asar, Software engineergrillar, hervir, Development worker, communityhornear, cocer al vapor o Actorsaltear. Hable con un nutricionista para aprender ms acerca de los mtodos de coccin saludables.  Limite los lquidos si se lo indica su mdico. La restriccin de lquidos puede reducir los sntomas de insuficiencia cardaca en Time Warneralgunas personas.  Controle su peso a diario. Es importante que se pese todos los [address] para reconocer anticipadamente cuando hay exceso de lquido. Hgalo todas las maanas despus de orinar y antes de Engineer, maintenancedesayunar. Use la misma ropa cada vez que se pese. Anote su United Technologies Corporationpeso todos los das. Lleve a su mdico el registro de [address] pes[address].  Controle y registre su presin arterial si se lo indica su mdico.  Contrlese el pulso si se lo indica su mdico.  Pierda peso si se lo indica su mdico. La prdida de peso puede reducir los sntomas de insuficiencia  cardaca en International aid/development workeralgunas personas.  Deje de fumar o mascar tabaco. La nicotina hace que el corazn trabaje ms y Raytheonestreche los vasos sanguneos. No utilice chicles ni parches de nicotina antes de hablar con su mdico.  Concurra a todas las visitas de control como se lo haya indicado el mdico. Esto es importante.  Limite el consumo de alcohol a no ms de 1 medida por da en las mujeres no embarazadas y 2 medidas en los hombres. Una medida equivale a 12onzas de cerveza, 5onzas de vino o 1onzas de bebidas alcohlicas de alta graduacin. Beber ms puede ser daino para el corazn. Informe a su mdico si bebe alcohol varias veces a la semana. Hable con su mdico acerca de si el alcohol es seguro para usted. Si el corazn ya ha sufrido un dao por el alcohol o sufre una insuficiencia cardaca grave, debe dejar de consumirlo completamente.  Deje de consumir drogas ilcitas.  Mantngase al da con las vacunas. Esto es especialmente importante prevenir las infecciones respiratorias con vacunas actualizadas contra el neumococo y la gripe.  Controle otros problemas de Belensalud, como hipertensin, diabetes, enfermedad de la tiroides o ritmos cardacos anormales segn las indicaciones de su mdico.  Aprenda a Dealermanejar el estrs.  Planifique perodos de descanso cuando se sienta fatigado.  Aprenda estrategias para manejar las altas temperaturas. Si el clima es extremadamente caluroso:  Evite la actividad fsica intensa.  Use el aire acondicionado o los ventiladores o busque un lugar ms fresco.  Evite la cafena y el alcohol.  Use ropa holgada, ligera y de colores claros.  Aprenda estrategias para manejar el fro. Si el clima es extremadamente fro:  Evite la actividad fsica intensa.  Vstase con varias capas de ropa.  Use mitones o guantes, un sombrero y [place] bufanda cuando salga.  Evite el alcohol.  Reciba asesoramiento y apoyo si lo necesita.  Busque programas de rehabilitacin y participe en  ellos para mantener o mejorar su independencia y su calidad de vida. SOLICITE ATENCIN MDICA SI:   Aumenta de peso, 3 libras (1,4 kg) en un da o 5 libras (2,3 Kg) en una semana.  Nota que le falta el aire y esto no es habitual en usted.  No puede participar en sus actividades fsicas habituales.  Se cansa con facilidad.  Tose ms de lo normal, especialmente al realizar actividad fsica.  Observa que sus manos, pies, tobillos o abdomen se le hinchan o estn ms hinchados que lo habitual.  Le cuesta dormir debido a que Engineer, manufacturing systems.  Siente que el corazn late rpido (palpitaciones).  Siente mareos o sensacin de desvanecimiento al ponerse de pie. SOLICITE ATENCIN MDICA DE INMEDIATO SI:   Tiene dificultad para respirar.  Hay una modificacin en el estado mental, como disminucin de la lucidez o dificultad para concentrarse.  Siente dolor o International aid/development worker.  Se desmaya una vez (sncope). ASEGRESE DE QUE:   Comprende estas instrucciones.  Controlar su afeccin.  Recibir ayuda de inmediato si no mejora o si empeora. Document Released: 02/15/2005 Document Revised: 07/02/2013 [facility] Patient Information 2015 [place], [place]. This information is not intended to replace advice given to you by your health care provider. Make sure you discuss any questions you have with your health care provider.   La diabetes mellitus y los alimentos (Diabetes Mellitus and Food) Es importante que controle su nivel de azcar en la sangre (glucosa). El nivel de glucosa en sangre depende en gran medida de lo que usted come. [place] alimentos saludables en las cantidades [place] a lo largo del Futures trader, aproximadamente a la misma hora [employer], lo ayudar a Chief Operating Officer su nivel de Event organiser. Tambin puede ayudarlo a retrasar o Fish farm manager de la diabetes mellitus. [place] de [employer] Corporation saludable incluso puede ayudarlo a Event organiser de presin arterial y a  Barista o Pharmacologist un peso saludable.  CMO PUEDEN AFECTARME LOS ALIMENTOS? Carbohidratos Los carbohidratos afectan el nivel de glucosa en sangre ms que cualquier otro tipo de alimento. El nutricionista lo ayudar a Chief Strategy Officer cuntos carbohidratos puede consumir en cada comida y ensearle a contarlos. El recuento de carbohidratos es importante para mantener la glucosa en sangre en un nivel saludable, en especial si utiliza insulina o toma determinados medicamentos para la diabetes mellitus. Alcohol El alcohol puede provocar disminuciones sbitas de la glucosa en sangre (hipoglucemia), en especial si utiliza insulina o toma determinados medicamentos para la diabetes mellitus. La hipoglucemia es una afeccin que puede poner en peligro la vida. Los sntomas de la hipoglucemia (somnolencia, mareos y Administrator) son similares a los sntomas de haber consumido mucho alcohol.  Si el mdico lo autoriza a beber alcohol, hgalo con moderacin y siga estas pautas:  Las mujeres no deben beber ms de un trago por da, y los hombres no deben beber ms de dos tragos por Futures trader. Un trago es igual a:  12 onzas (355 ml) de cerveza  5 onzas de vino (150 ml) de vino  1,5onzas (45ml) de bebidas espirituosas  No beba con el estmago vaco.  Mantngase hidratado. [place], gaseosas  dietticas o t helado sin azcar.  Las gaseosas comunes, los jugos y otros refrescos podran contener muchos carbohidratos y se Heritage managerdeben contar. QU ALIMENTOS NO SE RECOMIENDAN? Cuando haga las elecciones de alimentos, es importante que recuerde que todos los alimentos son distintos. Algunos tienen menos nutrientes que otros por porcin, aunque podran tener la misma cantidad de caloras o carbohidratos. Es difcil darle al cuerpo lo que necesita cuando consume alimentos con menos nutrientes. Estos son algunos ejemplos de alimentos que debera evitar ya que contienen muchas caloras y carbohidratos, pero pocos nutrientes:  NeurosurgeonGrasas  trans (la mayora de los alimentos procesados incluyen grasas trans en la etiqueta de Informacin nutricional).  Gaseosas comunes.  Jugos.  Caramelos.  Dulces, como tortas, pasteles, rosquillas y [place] Ridgegalletas.  Comidas fritas. QU ALIMENTOS PUEDO COMER? Consuma alimentos ricos en nutrientes, que nutrirn el cuerpo y lo mantendrn saludable. Los alimentos que debe comer tambin dependern de varios factores, como:  Las caloras que necesita.  Los medicamentos que toma.  Su peso.  El nivel de glucosa en [place] Colonysangre.  El Elktonnivel de presin arterial.  El nivel de colesterol. Tambin debe consumir una variedad de [place]villealimentos, como:  Protenas, como carne, aves, pescado, tofu, frutos secos y semillas (las protenas de Covingtonanimales magros son mejores).  Nils PyleFrutas.  Verduras.  Productos lcteos, como [place], queso y yogur (descremados son mejores).  Panes, granos, pastas, cereales, arroz y frijoles.  Grasas, como aceite de Hoffmanoliva, Indiamargarina sin grasas trans, aceite de canola, aguacate y Mildredaceitunas. TODOS LOS QUE PADECEN DIABETES MELLITUS TIENEN EL MISMO PLAN DE COMIDAS? Dado que todas las personas que padecen diabetes mellitus son distintas, no hay un solo plan de comidas que funcione para todos. Es muy importante que se rena con un nutricionista que lo ayudar a crear un plan de comidas adecuado para usted. Document Released: 05/25/2007 Document Revised: 02/20/2013 [facility] Patient Information 2015 [place], [place]. This information is not intended to replace advice given to you by your health care provider. Make sure you discuss any questions you have with your health care provider.           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

## 2013-12-21 ENCOUNTER — Encounter (HOSPITAL_COMMUNITY): Payer: Self-pay | Admitting: Vascular Surgery

## 2013-12-27 NOTE — Discharge Summary (Signed)
I saw [name] on day of discharge and assisted with the planning.  She was given discharge appointments, however, it was not clear if she planned to attempt to stay in the [place] or [place].

## 2013-12-31 ENCOUNTER — Ambulatory Visit (HOSPITAL_BASED_OUTPATIENT_CLINIC_OR_DEPARTMENT_OTHER): Payer: PRIVATE HEALTH INSURANCE | Admitting: *Deleted

## 2013-12-31 ENCOUNTER — Ambulatory Visit: Payer: PRIVATE HEALTH INSURANCE | Attending: Internal Medicine | Admitting: Internal Medicine

## 2013-12-31 ENCOUNTER — Encounter: Payer: Self-pay | Admitting: Internal Medicine

## 2013-12-31 VITALS — BP 178/97 | HR 100 | Temp 98.1°F | Resp 18 | Ht 59.06 in | Wt 198.0 lb

## 2013-12-31 DIAGNOSIS — Z23 Encounter for immunization: Secondary | ICD-10-CM

## 2013-12-31 DIAGNOSIS — F329 Major depressive disorder, single episode, unspecified: Secondary | ICD-10-CM | POA: Insufficient documentation

## 2013-12-31 DIAGNOSIS — I10 Essential (primary) hypertension: Secondary | ICD-10-CM

## 2013-12-31 DIAGNOSIS — E1065 Type 1 diabetes mellitus with hyperglycemia: Secondary | ICD-10-CM

## 2013-12-31 DIAGNOSIS — Z6839 Body mass index (BMI) 39.0-39.9, adult: Secondary | ICD-10-CM | POA: Insufficient documentation

## 2013-12-31 DIAGNOSIS — E780 Pure hypercholesterolemia: Secondary | ICD-10-CM | POA: Insufficient documentation

## 2013-12-31 DIAGNOSIS — IMO0002 Reserved for concepts with insufficient information to code with codable children: Secondary | ICD-10-CM

## 2013-12-31 DIAGNOSIS — I129 Hypertensive chronic kidney disease with stage 1 through stage 4 chronic kidney disease, or unspecified chronic kidney disease: Secondary | ICD-10-CM | POA: Insufficient documentation

## 2013-12-31 DIAGNOSIS — Z7982 Long term (current) use of aspirin: Secondary | ICD-10-CM | POA: Insufficient documentation

## 2013-12-31 DIAGNOSIS — E1029 Type 1 diabetes mellitus with other diabetic kidney complication: Secondary | ICD-10-CM

## 2013-12-31 DIAGNOSIS — I5043 Acute on chronic combined systolic (congestive) and diastolic (congestive) heart failure: Secondary | ICD-10-CM | POA: Insufficient documentation

## 2013-12-31 DIAGNOSIS — Z794 Long term (current) use of insulin: Secondary | ICD-10-CM | POA: Insufficient documentation

## 2013-12-31 DIAGNOSIS — N184 Chronic kidney disease, stage 4 (severe): Secondary | ICD-10-CM | POA: Insufficient documentation

## 2013-12-31 DIAGNOSIS — E669 Obesity, unspecified: Secondary | ICD-10-CM | POA: Insufficient documentation

## 2013-12-31 LAB — COMPLETE METABOLIC PANEL WITH GFR
ALK PHOS: 112 U/L (ref 39–117)
ALT: 8 U/L (ref 0–35)
AST: 17 U/L (ref 0–37)
Albumin: 3.3 g/dL — ABNORMAL LOW (ref 3.5–5.2)
BUN: 82 mg/dL — AB (ref 6–23)
CALCIUM: 8.5 mg/dL (ref 8.4–10.5)
CO2: 22 mEq/L (ref 19–32)
Chloride: 105 mEq/L (ref 96–112)
Creat: 5.91 mg/dL — ABNORMAL HIGH (ref 0.50–1.10)
GFR, EST AFRICAN AMERICAN: 9 mL/min — AB
GFR, Est Non African American: 8 mL/min — ABNORMAL LOW
Glucose, Bld: 112 mg/dL — ABNORMAL HIGH (ref 70–99)
Potassium: 4.5 mEq/L (ref 3.5–5.3)
Sodium: 142 mEq/L (ref 135–145)
Total Bilirubin: 0.3 mg/dL (ref 0.2–1.2)
Total Protein: 6 g/dL (ref 6.0–8.3)

## 2013-12-31 LAB — GLUCOSE, POCT (MANUAL RESULT ENTRY): POC Glucose: 151 mg/dl — AB (ref 70–99)

## 2013-12-31 MED ORDER — CARVEDILOL 25 MG PO TABS: 25.0000 mg | ORAL_TABLET | Freq: Two times a day (BID) | ORAL | Status: DC

## 2013-12-31 MED ORDER — INSULIN NPH (HUMAN) (ISOPHANE) 100 UNIT/ML ~~LOC~~ SUSP: 8.0000 [IU] | Freq: Two times a day (BID) | SUBCUTANEOUS | Status: DC

## 2013-12-31 MED ORDER — INSULIN REGULAR HUMAN 100 UNIT/ML IJ SOLN: 10.0000 [IU] | Freq: Three times a day (TID) | INTRAMUSCULAR | Status: DC

## 2013-12-31 MED ORDER — FUROSEMIDE 80 MG PO TABS: 80.0000 mg | ORAL_TABLET | Freq: Two times a day (BID) | ORAL | Status: DC

## 2013-12-31 NOTE — Progress Notes (Signed)
Establish Care HFU Pt stated has fluid in body. Complaining mid back pain and abdominal, Head ache  Requested Rx Inulin and needles refills

## 2013-12-31 NOTE — Progress Notes (Signed)
Patient ID: Joan Young, female   DOB: [birth year], 53 y.o.   MRN: [id number]  [id number]  [id number]  DOB - [birth year]  CC:  Chief Complaint  Patient presents with  . Establish Care  . Hospitalization Follow-up       HPI: Joan Young is a 53 y.o. female with hx of HTN, DM, depression, HLD, CKD.  Patient is visiting family in [place]. Spanish speaking only. Gets medical care in [place]. Was told she has ischemic cardiomyopathy, kidney abnormality from DM and HTN. She has been discharged from hospital admission a couple of weeks ago, at that time she had a AV fistula placed for future dialysis use. She has a appointment on the 11/20 to have her fistula reassessed for blocked flow.  She has not been to see a Nephrologist due to the lack of insurance.  She only makes very small amounts of urine ranging around 3 voids per day.  She reports that she takes all of her medications daily.  She last took her anti-hypertensives around 8 am today.  She has not been taking a beta  Blocker as of yet.  Her cbg's are usually 160 and below.  She notes that she has gained 11 pounds since hospital discharge.    Allergies  Allergen Reactions  . Hydromorphone Hcl Other (See Comments)    Doesn't remember   Past Medical History  Diagnosis Date  . Obesity   . Macular degeneration disease   . Depression   . High cholesterol Dx 2005  . Hypertension Dx [id number]  . Diabetes mellitus without complication DX 1969    type 332 since 53 years old but reports starting insulin at [age] years old   Current Outpatient Prescriptions on File Prior to Visit  Medication Sig Dispense Refill  . aspirin 81 MG tablet Take 81 mg by mouth daily.    . calcium carbonate (TUMS) 500 MG chewable tablet Chew 1 tablet (200 mg of elemental calcium total) by mouth 3 (three) times daily with meals. 90 tablet 2  . FLUoxetine (PROZAC) 20 MG capsule Take 1 capsule (20 mg total) by mouth daily. 30 capsule 2  . furosemide  (LASIX) 40 MG tablet Take 1 tablet (40 mg total) by mouth 2 (two) times daily. 30 tablet 1  . gabapentin (NEURONTIN) 100 MG capsule Take 2 capsules (200 mg total) by mouth 3 (three) times daily. 180 capsule 0  . hydrALAZINE (APRESOLINE) 25 MG tablet Take 0.5 tablets (12.5 mg total) by mouth 3 (three) times daily. 90 tablet 2  . insulin NPH Human (HUMULIN N,NOVOLIN N) 100 UNIT/ML injection Inject 8-24 Units into the skin 2 (two) times daily. 8 units in the morning and 24 units at after supper    . insulin regular (NOVOLIN R,HUMULIN R) 100 units/mL injection Inject 10-16 Units into the skin 3 (three) times daily before meals. 16 units every morning 16 units at lunch 10 units at bedtime.  Per sliding scale    . isosorbide dinitrate (ISORDIL) 20 MG tablet Take 10 mg by mouth daily.    Marland Kitchen. lovastatin (MEVACOR) 20 MG tablet Take 1 tablet (20 mg total) by mouth at bedtime. 30 tablet 2  . ranitidine (ZANTAC) 150 MG tablet Take 1 tablet (150 mg total) by mouth 2 (two) times daily. 60 tablet 2   No current facility-administered medications on file prior to visit.   Family History  Problem Relation Age of Onset  . Hypertension Father   . Diabetes Father   .  Kidney failure Father   . Kidney failure Sister    History   Social History  . Marital Status: Married    Spouse Name: N/A    Number of Children: N/A  . Years of Education: 6th grade   Occupational History  . homemaker    Social History Main Topics  . Smoking status: Never Smoker   . Smokeless tobacco: Not on file  . Alcohol Use: No  . Drug Use: No  . Sexual Activity: Not on file   Other Topics Concern  . Not on file   Social History Narrative    Review of Systems  Respiratory: Positive for cough and shortness of breath.   Cardiovascular: Positive for palpitations and orthopnea (uses 2-3 pillows at night or sit up). Chest pain: pressure.  Gastrointestinal: Positive for abdominal pain (cramping).       Abdominal fullness    Musculoskeletal: Positive for back pain.  Neurological: Positive for dizziness and headaches.       Objective:   Filed Vitals:   12/31/13 1010  BP: 178/97  Pulse: 100  Temp: 98.1 F (36.7 C)  Resp: 18    Physical Exam  Constitutional: She is oriented to person, place, and time.  HENT:  Right Ear: External ear normal.  Left Ear: External ear normal.  Mouth/Throat: Oropharynx is clear and moist.  Eyes: EOM are normal. Pupils are equal, round, and reactive to light.  Neck: Normal range of motion. Neck supple.  Cardiovascular: Normal rate, regular rhythm and normal heart sounds.   Blocked AV fistula  Pulmonary/Chest: Effort normal and breath sounds normal.  Abdominal: Soft. Bowel sounds are normal. She exhibits no distension. There is tenderness ( left upper and lower quadrant).  Musculoskeletal: She exhibits edema (1+bilateral lower extremities).  Neurological: She is alert and oriented to person, place, and time.  Skin: Skin is warm and dry.  Vitals reviewed.    Lab Results  Component Value Date   WBC 5.7 12/20/2013   HGB 10.8* 12/20/2013   HCT 32.2* 12/20/2013   MCV 85.0 12/20/2013   PLT 265 12/20/2013   Lab Results  Component Value Date   CREATININE 6.99* 12/20/2013   BUN 82* 12/20/2013   NA 135* 12/20/2013   K 3.6* 12/20/2013   CL 92* 12/20/2013   CO2 25 12/20/2013    Lab Results  Component Value Date   HGBA1C 8.3* 12/12/2013   Lipid Panel  No results found for: CHOL, TRIG, HDL, CHOLHDL, VLDL, LDLCALC     Assessment and plan:   Lexandra was seen today for establish care and hospitalization follow-up.  Diagnoses and associated orders for this visit:  Acute on chronic combined systolic and diastolic congestive heart failure/Essential hypertension - Begin carvedilol (COREG) 25 MG tablet; Take 1 tablet (25 mg total) by mouth 2 (two) times daily with a meal. - Increased furosemide (LASIX) 80 MG tablet; Take 1 tablet (80 mg total) by mouth 2 (two)  times daily. 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 16 units every morning 16 units at lunch 10 units at bedtime.  Per sliding scale - Refill insulin NPH Human (HUMULIN N,NOVOLIN N) 100 UNIT/ML injection; Inject 0.08-0.24 mLs (8-24 Units total) into  the skin 2 (two) times daily. 8 units in the morning and 24 units at after supper  Chronic kidney disease (CKD), stage IV (severe) - Ambulatory referral to Nephrology--Patient needs ASAP appointment, GFR of 6.  - COMPLETE METABOLIC PANEL WITH GFR Patient needs dialysis asap, but still needs cardiology referral.  Patient needs to establish with Cardio  Due to language barrier, an interpreter was present during the history-taking and subsequent discussion (and for part of the physical exam) with this patient.   Return in about 2 days (around 01/02/2014) for Nurse Visit-bp check and 3 weeks pcp.  The patient was given clear instructions to go to ER or return to medical center if symptoms don't improve, worsen or new problems develop. The patient verbalized understanding.   Holland CommonsKECK, Inda Mcglothen, NP-C [facility] [phone] 12/31/2013, 10:33 AM

## 2014-01-07 ENCOUNTER — Telehealth: Payer: Self-pay | Admitting: *Deleted

## 2014-01-07 ENCOUNTER — Ambulatory Visit: Payer: PRIVATE HEALTH INSURANCE

## 2014-01-07 NOTE — Telephone Encounter (Signed)
Pt aware of results. Advised to  Keep nephrology appointment

## 2014-01-07 NOTE — Telephone Encounter (Signed)
-----   Message from Ambrose FinlandValerie A Keck, NP sent at 01/01/2014  9:27 PM EST ----- Patient's kidney function is still poor that has improved slightly. Make sure to stress the patient keeps nephrology appointment

## 2014-01-11 ENCOUNTER — Telehealth: Payer: Self-pay | Admitting: Internal Medicine

## 2014-01-15 ENCOUNTER — Ambulatory Visit: Payer: PRIVATE HEALTH INSURANCE | Attending: Internal Medicine | Admitting: *Deleted

## 2014-01-15 VITALS — BP 184/76 | HR 76 | Temp 98.4°F

## 2014-01-15 DIAGNOSIS — I10 Essential (primary) hypertension: Secondary | ICD-10-CM | POA: Insufficient documentation

## 2014-01-15 MED ORDER — HYDRALAZINE HCL 25 MG PO TABS
25.0000 mg | ORAL_TABLET | Freq: Three times a day (TID) | ORAL | Status: DC
Start: 2014-01-15 — End: 2014-02-04

## 2014-01-15 NOTE — Progress Notes (Signed)
Patient presents for BP check Speaking with pt via [address], Flossie DibbleDiana ID [id number] Patient states she just came from dialysis where they told her BP was elevated entire time Reports receiving dialysis 3 times weekly: Tuesdas, Thursdays and Saturdays Denies LE edema at present  States following fluid restriction and low sodium diet States drinking < 12 oz water per day and 1/2 cup coffee per day States appt with cardiologist on 12/8/5 States taking all meds as directed including coreg bid and increasing lasix to 80 mg bid as instructed at last OV Patient is unsure how she is taking insulin doses. States husband helps her with this States she checks her weight every other day as instructed by dialysis team Reports wt on Friday, 01/11/14 was 193.3lb and today weighed 198.8 lb before and after dialysis States she has been recording weight but did not bring record with her  BP 184/76 left arm manually with adult cuff P 76 T 98.4 oral SPO2 97%  Per PCP: Increase hydralazine to 25 mg tid May increase fluid to 2L/day Return in 2 weeks for nurse visit for BP check

## 2014-01-17 ENCOUNTER — Encounter: Payer: Self-pay | Admitting: Vascular Surgery

## 2014-01-17 MED ORDER — GLUCOSE BLOOD VI STRP: ORAL_STRIP | Status: DC

## 2014-01-18 ENCOUNTER — Other Ambulatory Visit (HOSPITAL_COMMUNITY): Payer: PRIVATE HEALTH INSURANCE

## 2014-01-18 ENCOUNTER — Encounter (HOSPITAL_COMMUNITY): Payer: PRIVATE HEALTH INSURANCE

## 2014-01-18 ENCOUNTER — Other Ambulatory Visit: Payer: Self-pay | Admitting: *Deleted

## 2014-01-18 ENCOUNTER — Ambulatory Visit: Payer: PRIVATE HEALTH INSURANCE | Admitting: Vascular Surgery

## 2014-01-18 MED ORDER — FREESTYLE LANCETS MISC: Status: DC

## 2014-01-18 MED ORDER — GLUCOSE BLOOD VI STRP: ORAL_STRIP | Status: DC

## 2014-01-18 MED ORDER — FREESTYLE SYSTEM KIT: 1.0000 | PACK | Status: DC | PRN

## 2014-01-28 ENCOUNTER — Ambulatory Visit: Payer: PRIVATE HEALTH INSURANCE | Attending: Internal Medicine

## 2014-02-01 ENCOUNTER — Ambulatory Visit: Payer: PRIVATE HEALTH INSURANCE | Admitting: Vascular Surgery

## 2014-02-04 ENCOUNTER — Ambulatory Visit: Payer: Self-pay | Attending: Internal Medicine | Admitting: *Deleted

## 2014-02-04 VITALS — BP 196/78 | HR 88 | Temp 98.1°F | Resp 20

## 2014-02-04 DIAGNOSIS — I10 Essential (primary) hypertension: Secondary | ICD-10-CM | POA: Insufficient documentation

## 2014-02-04 MED ORDER — HYDRALAZINE HCL 50 MG PO TABS: 50.0000 mg | ORAL_TABLET | Freq: Three times a day (TID) | ORAL | Status: DC

## 2014-02-04 NOTE — Progress Notes (Signed)
Patient presents with daughter for BP check Med list reviewed; states taking all meds as directed [facility]atin to pravastatin due to abdominal pain, however, patient continues with same pain on pravastatin Patient states fistula left antecub placed 6 weeks ago does not work and has been replaced by fistula in right arm, however, c/o pain left arm at site of fistula since placement; rates 8/10 at present Patient c/o diarrhea after eating for 1 month  BP 196/78 left arm manually with adult cuff P 88 R 20 T 98.1 oral SPO2 98%   Per PCP: Increase hydralazine to 50 mg tid Schedule first available appt with PCP to discuss other issues

## 2014-02-06 ENCOUNTER — Institutional Professional Consult (permissible substitution): Payer: PRIVATE HEALTH INSURANCE | Admitting: Cardiovascular Disease

## 2014-02-07 ENCOUNTER — Encounter: Payer: Self-pay | Admitting: Vascular Surgery

## 2014-02-08 ENCOUNTER — Other Ambulatory Visit (HOSPITAL_COMMUNITY): Payer: PRIVATE HEALTH INSURANCE

## 2014-02-08 ENCOUNTER — Ambulatory Visit: Payer: PRIVATE HEALTH INSURANCE | Admitting: Vascular Surgery

## 2014-02-08 ENCOUNTER — Encounter (HOSPITAL_COMMUNITY): Payer: PRIVATE HEALTH INSURANCE

## 2014-02-11 ENCOUNTER — Ambulatory Visit: Payer: Self-pay | Attending: Internal Medicine | Admitting: Internal Medicine

## 2014-02-11 ENCOUNTER — Encounter: Payer: Self-pay | Admitting: Internal Medicine

## 2014-02-11 VITALS — BP 158/64 | HR 84 | Temp 99.2°F | Resp 16 | Ht 59.0 in | Wt 194.0 lb

## 2014-02-11 DIAGNOSIS — Z79899 Other long term (current) drug therapy: Secondary | ICD-10-CM | POA: Insufficient documentation

## 2014-02-11 DIAGNOSIS — I5043 Acute on chronic combined systolic (congestive) and diastolic (congestive) heart failure: Secondary | ICD-10-CM

## 2014-02-11 DIAGNOSIS — Z794 Long term (current) use of insulin: Secondary | ICD-10-CM | POA: Insufficient documentation

## 2014-02-11 DIAGNOSIS — M79602 Pain in left arm: Secondary | ICD-10-CM

## 2014-02-11 DIAGNOSIS — IMO0002 Reserved for concepts with insufficient information to code with codable children: Secondary | ICD-10-CM

## 2014-02-11 DIAGNOSIS — I129 Hypertensive chronic kidney disease with stage 1 through stage 4 chronic kidney disease, or unspecified chronic kidney disease: Secondary | ICD-10-CM | POA: Insufficient documentation

## 2014-02-11 DIAGNOSIS — E1029 Type 1 diabetes mellitus with other diabetic kidney complication: Secondary | ICD-10-CM

## 2014-02-11 DIAGNOSIS — Z7982 Long term (current) use of aspirin: Secondary | ICD-10-CM | POA: Insufficient documentation

## 2014-02-11 DIAGNOSIS — J0110 Acute frontal sinusitis, unspecified: Secondary | ICD-10-CM

## 2014-02-11 DIAGNOSIS — J321 Chronic frontal sinusitis: Secondary | ICD-10-CM | POA: Insufficient documentation

## 2014-02-11 DIAGNOSIS — E1065 Type 1 diabetes mellitus with hyperglycemia: Secondary | ICD-10-CM | POA: Insufficient documentation

## 2014-02-11 DIAGNOSIS — N184 Chronic kidney disease, stage 4 (severe): Secondary | ICD-10-CM

## 2014-02-11 LAB — GLUCOSE, POCT (MANUAL RESULT ENTRY): POC GLUCOSE: 233 mg/dL — AB (ref 70–99)

## 2014-02-11 MED ORDER — AMOXICILLIN 250 MG PO CAPS: 250.0000 mg | ORAL_CAPSULE | Freq: Every day | ORAL | Status: DC

## 2014-02-11 NOTE — Patient Instructions (Signed)
Diálisis °(Dialysis) °La diálisis es un procedimiento que reemplaza parte de la función que realizan los riñones sanos. Se realiza cuando se pierde alrededor del 85 al 90 % de la función renal. También puede indicarse antes si tuviera la posibilidad de que los síntomas mejoraran con la diálisis. Durante la diálisis, se eliminan los desechos, sales y agua en exceso de la sangre, y se mantiene el nivel de ciertas sustancias químicas en la sangre (como el potasio). La diálisis se realiza en sesiones. Las sesiones de diálisis se continúan hasta que los riñones mejoran. Si los riñones no mejoran, como sucede en la enfermedad renal terminal, la diálisis se continúa de por vida o hasta que pueda recibir un nuevo riñón (trasplante renal). Hay dos tipos de diálisis: hemodiálisis y diálisis peritoneal. °¿QUÉ ES LA HEMODIÁLISIS?  °La hemodiálisis es un tipo de diálisis en la que se utiliza una máquina llamada dializador para filtrar la sangre. Antes de comenzar la hemodiálisis, le harán una cirugía para crear un sitio en el que la sangre pueda extraerse del cuerpo y volver a ingresar en él (acceso vascular). Hay tres tipos de accesos vasculares: °· Fístula arteriovenosa. Para crear este tipo de acceso, se conecta una arteria a una vena (generalmente en el brazo). La fístula demora entre 1 y 6 meses para desarrollarse después de la cirugía. Si se desarrolla adecuadamente, generalmente su duración es mayor que los otros tipos de accesos vasculares. También es menos probable que se infecte y se formen coágulos sanguíneos. °· Injerto arteriovenoso. Para crear este tipo de acceso, se conectan una arteria y una vena del brazo con un tubo. El injerto puede usarse de 2 a 3 semanas después de la cirugía. °· Catéter venoso. Para crear este tipo de acceso, se coloca un tubo delgado y flexible (catéter) en una vena grande del cuello, el tórax o la ingle. El catéter puede usarse inmediatamente. Por lo general, se usa como un acceso  temporario cuando es necesario que la diálisis comience de inmediato. °Durante la hemodiálisis, la sangre sale del cuerpo a través del acceso. Viaja a través del tubo al dializador, donde se filtra. Luego la sangre retorna al cuerpo a través de otro tubo. °La hemodiálisis generalmente la realiza un médico en el hospital o en un centro de diálisis, tres veces por semana. Las sesiones duran entre 3 y 4 horas. También puede hacerse en el hogar, con la ayuda de una persona entrenada.  °¿QUÉ ES LA DIÁLISIS PERITONEAL? °La diálisis peritoneal es un tipo de diálisis en el que se usa como filtro la delgada membrana que cubre el abdomen (peritoneo). Antes de comenzar con la diálisis peritoneal, le harán una cirugía para colocar un catéter en el abdomen. El catéter se usará para introducir y extraer del abdomen un líquido llamado dialisato. Al inicio de la sesión, llenarán su abdomen con dialisato. Durante la sesión, los desechos, las sales y los líquidos en exceso de la sangre pasan a través del peritoneo y hacia el dialisato. El dialisato se drena del cuerpo al finalizar la sesión. El proceso de llenar y drenar el dialisato se llama intercambio. Los intercambios se repiten hasta que haya usado todo el dialisato del día. °La diálisis peritoneal puede llevarse a cabo en el hogar o en casi cualquier otro lugar. Se realiza todos los días. Es posible que necesite hasta cinco intercambios por día. La cantidad de tiempo que permanece el dialisato en el organismo entre los intercambios se llama tiempo de permanencia. El tiempo de permanencia depende del   número de intercambios necesarios y de las características del peritoneo. Generalmente, oscila entre 1,5 y 3 horas. Podrá llevar una vida normal entre los intercambios. Como alternativa, los intercambios podrán realizarse durante la noche, mientras duerme, con un dispositivo llamado ciclador. °¿QUÉ TIPO DE DIÁLISIS DEBO ELEGIR?  °Tanto la hemodiálisis como la diálisis peritoneal  tienen ventajas y desventajas. Hable con su médico acerca de qué tipo de diálisis es el mejor para usted. Hay que considerar sus preferencias, su estilo de vida y su enfermedad. En algunos casos, puede elegirse solo un tipo de diálisis.  °Ventajas de la hemodiálisis °· Se realiza con menos frecuencia que la diálisis peritoneal. °· Otra persona puede hacer la diálisis por usted. °· Si concurre a un centro especializado en diálisis, el medico podrá detectar los problemas inmediatamente. °· En el centro de diálisis, podrá interactuar con otras personas que se están haciendo diálisis. Aquí podrá encontrar apoyo emocional. °Desventajas de la hemodiálisis °· La hemodiálisis puede causar calambres e hipotensión arterial. Puede dejarle una sensación de cansancio en los días en que le realizan el tratamiento. °· Si concurre a un centro de diálisis, deberá concertar citas semanales en los horarios disponibles del centro. °· Deberá tener más cuidado cuando viaje. Si concurre a un centro de diálisis será necesario que haga arreglos para encontrar un centro de diálisis cercano a su casa. Si realiza el tratamiento en su casa, será necesario que lleve el dializador con usted hasta su lugar de destino. °· Será necesario que evite más alimentos de los que debe evitar en la diálisis peritoneal. °Ventajas de la diálisis peritoneal °· Es menos probable que cause calambres e hipotensión arterial. °· Podrá realizar los intercambios usted mismo, donde se encuentre, incluso cuando viaje. °· No es necesario que evite tantos alimentos como en la hemodiálisis. °Desventajas de la diálisis peritoneal °· Debe realizarse con más frecuencia que la hemodiálisis. °· La diálisis peritoneal requiere destreza con las manos. También debe ser capaz de levantar bolsas. °· Tendrá que aprender técnicas de esterilización. Deberá practicarlas todos los días para reducir el riesgo de infección. °¿QUÉ CAMBIOS DEBERÉ HACER EN LA DIETA DURANTE LA DIÁLISIS? °Tanto  en la hemodiálisis como en la diálisis peritoneal se requiere que haga algunos cambios en su dieta. Por ejemplo, deberá limitar su ingesta de alimentos con elevado contenido de fósforo y potasio. También deberá limitar su ingesta de líquidos. Su nutricionista puede ayudar a planificar sus comidas. Un buen plan de comidas puede mejorar la diálisis y su salud.  °¿QUÉ DEBO ESPERAR AL COMENZAR LA DIÁLISIS? °Adaptarse al tratamiento de diálisis, a las citas programadas y a la dieta puede tomar algún tiempo. Puede que sea necesario que deje de trabajar y no pueda hacer algunas de las cosas que normalmente hace. Es probable que se sienta ansioso o deprimido cuando inicie la diálisis. Con el tiempo, muchas personas se sienten mejor debido a la diálisis. Algunos pueden volver a trabajar después de realizar algunos cambios, como disminuir la intensidad del trabajo. °¿DÓNDE PUEDO ENCONTRAR MÁS INFORMACIÓN?  °· Fundación Nacional del Riñón (National Kidney Foundation): www.kidney.org °· Asociación Americana de Pacientes Renales (American Association of Kidney Patients, AAKP): www.aakp.org °· Fondo Americano para Problemas Renales (American Kidney Fund): www.kidneyfund.org °Document Released: 02/15/2005 Document Revised: 07/02/2013 °ExitCare® Patient Information ©2015 Exit[employer]. This information is not intended to replace advice given to you by your health care provider. Make sure you discuss any questions you have with your health care provider. ° °

## 2014-02-11 NOTE — Progress Notes (Signed)
Lt arm pain. Pain started after placing port for dialysis about two month ago

## 2014-02-11 NOTE — Progress Notes (Signed)
Patient ID: Joan Young, female   DOB: 10-07-1960, 53 y.o.   MRN: [id number]  CC:  Left arm pain  HPI:  Patient presents to clinic today for a follow up of left arm pain that started two months ago after getting a attempted fistula placement. Patient states that the hospital attempted to place a fistula but it was blocked.  She has been seeing a Vascular surgery in [facility] who placed her [facility].  Patient is spanish speaking only and is a poor historian.  She states that she has had the perm cath for 5 weeks and is unsure when she is due for a follow up to have it removed.  Patient does not remember seeing the Nephrologist in [facility] but knows that she was referred to a Endocrinologist.  Patient states that she was also sent to Internal Medicine for a complete physical through [facility].  Patient currently gets HD on Tuesday, Thursday, and Saturday. She has here first appt with Endocrinology and Cardiology on December 22nd.     Allergies  Allergen Reactions  . Hydromorphone Itching    unknown  . Hydromorphone Hcl Other (See Comments)    Doesn't remember  . Latex Rash  . Tape Rash   Past Medical History  Diagnosis Date  . Obesity   . Macular degeneration disease   . Depression   . High cholesterol Dx 2005  . Hypertension Dx 7846  . Diabetes mellitus without complication DX 9629    type 22 since 53 years old but reports starting insulin at 53 years old   Current Outpatient Prescriptions on File Prior to Visit  Medication Sig Dispense Refill  . aspirin 81 MG tablet Take 81 mg by mouth daily.    . calcium carbonate (TUMS) 500 MG chewable tablet Chew 1 tablet (200 mg of elemental calcium total) by mouth 3 (three) times daily with meals. 90 tablet 2  . carvedilol (COREG) 25 MG tablet Take 1 tablet (25 mg total) by mouth 2 (two) times daily with a meal. 60 tablet 3  . FLUoxetine (PROZAC) 20 MG capsule Take 1 capsule (20 mg total) by mouth daily. 30 capsule 2   . furosemide (LASIX) 80 MG tablet Take 1 tablet (80 mg total) by mouth 2 (two) times daily. 60 tablet 1  . gabapentin (NEURONTIN) 100 MG capsule Take 2 capsules (200 mg total) by mouth 3 (three) times daily. 180 capsule 0  . glucose blood (FREESTYLE INSULINX TEST) test strip Use as instructed 100 each 12  . glucose blood test strip Use as instructed 3 times daily 100 each 12  . glucose monitoring kit (FREESTYLE) monitoring kit 1 each by Does not apply route as needed for other. E11.9  Test up to 4 times daily 1 each 0  . hydrALAZINE (APRESOLINE) 50 MG tablet Take 1 tablet (50 mg total) by mouth 3 (three) times daily.    . insulin NPH Human (HUMULIN N,NOVOLIN N) 100 UNIT/ML injection Inject 0.08-0.24 mLs (8-24 Units total) into the skin 2 (two) times daily. 8 units in the morning and 24 units at after supper 10 mL 3  . insulin regular (NOVOLIN R,HUMULIN R) 100 units/mL injection Inject 0.1-0.16 mLs (10-16 Units total) into the skin 3 (three) times daily before meals. 16 units every morning 16 units at lunch 10 units at bedtime.  Per sliding scale 10 mL 3  . isosorbide dinitrate (ISORDIL) 20 MG tablet Take 10 mg by mouth daily.    Marland Kitchen  Lancets (FREESTYLE) lancets Use as instructed 100 each 12  . lovastatin (MEVACOR) 20 MG tablet Take 1 tablet (20 mg total) by mouth at bedtime. 30 tablet 2  . pravastatin (PRAVACHOL) 40 MG tablet Take 40 mg by mouth daily.    . ranitidine (ZANTAC) 150 MG tablet Take 1 tablet (150 mg total) by mouth 2 (two) times daily. 60 tablet 2   No current facility-administered medications on file prior to visit.   Family History  Problem Relation Age of Onset  . Hypertension Father   . Diabetes Father   . Kidney failure Father 63  . Kidney failure Sister 35  . Kidney failure Brother 32  . Kidney failure Sister 84   History   Social History  . Marital Status: Married    Spouse Name: N/A    Number of Children: N/A  . Years of Education: 6th grade   Occupational History   . homemaker    Social History Main Topics  . Smoking status: Never Smoker   . Smokeless tobacco: Not on file  . Alcohol Use: No  . Drug Use: No  . Sexual Activity: Not on file   Other Topics Concern  . Not on file   Social History Narrative    Review of Systems  Constitutional: Positive for malaise/fatigue.  Respiratory: Positive for shortness of breath (with exertion). Negative for cough, sputum production and wheezing.   Cardiovascular: Negative.   Neurological: Positive for weakness.  All other systems reviewed and are negative.     Objective:   Filed Vitals:   02/11/14 1045  BP: 158/64  Pulse: 84  Temp: 99.2 F (37.3 C)  Resp: 16    Physical Exam  Constitutional: She is oriented to person, place, and time.  Neck: No JVD present.  Cardiovascular: Normal rate, regular rhythm and normal heart sounds.   Right brachial AV fistula  Pulmonary/Chest: Effort normal and breath sounds normal.  Abdominal: Soft. Bowel sounds are normal.  Musculoskeletal: Normal range of motion. She exhibits edema (1+ non pitting edema').  Neurological: She is alert and oriented to person, place, and time. She has normal reflexes.     Lab Results  Component Value Date   WBC 5.7 12/20/2013   HGB 10.8* 12/20/2013   HCT 32.2* 12/20/2013   MCV 85.0 12/20/2013   PLT 265 12/20/2013   Lab Results  Component Value Date   CREATININE 5.91* 12/31/2013   BUN 82* 12/31/2013   NA 142 12/31/2013   K 4.5 12/31/2013   CL 105 12/31/2013   CO2 22 12/31/2013    Lab Results  Component Value Date   HGBA1C 8.3* 12/12/2013   Lipid Panel  No results found for: CHOL, TRIG, HDL, CHOLHDL, VLDL, LDLCALC     Assessment and plan:   Joan Young was seen today for arm pain.  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  Chronic kidney disease (CKD), stage IV (severe) Follow up with Vascular and Nephrology    Return for Late January after meeting with Nephrology, Cards, and Vascular.  Will reassess at that time.    Due to language  barrier, an interpreter was present during the history-taking and subsequent discussion (and for part of the physical exam) with this patient.       Chari Manning, [facility] [phone] 02/11/2014, 11:16 AM

## 2014-02-12 ENCOUNTER — Inpatient Hospital Stay (HOSPITAL_COMMUNITY): Admission: RE | Admit: 2014-02-12 | Payer: Self-pay | Source: Ambulatory Visit

## 2014-02-13 ENCOUNTER — Ambulatory Visit (HOSPITAL_COMMUNITY)
Admission: RE | Admit: 2014-02-13 | Discharge: 2014-02-13 | Disposition: A | Discharge status: Home / Self Care | Payer: Self-pay | Source: Ambulatory Visit | Attending: Internal Medicine | Admitting: Internal Medicine

## 2014-02-13 DIAGNOSIS — M79609 Pain in unspecified limb: Secondary | ICD-10-CM

## 2014-02-13 DIAGNOSIS — M79602 Pain in left arm: Secondary | ICD-10-CM | POA: Insufficient documentation

## 2014-02-13 NOTE — Progress Notes (Signed)
*  Preliminary Results* Left upper extremity venous duplex completed. Left upper extremity is negative for deep and superficial vein thrombosis.  Preliminary results discussed with Dusty of Dr.Keck's office.  02/13/2014 12:20 PM  Gertie FeyMichelle Neo Yepiz, RVT, RDCS, RDMS

## 2014-02-18 ENCOUNTER — Telehealth: Payer: Self-pay | Admitting: *Deleted

## 2014-02-18 NOTE — Telephone Encounter (Signed)
-----   Message from Ambrose FinlandValerie A Keck, NP sent at 02/14/2014  5:27 PM EST ----- Negative for DVT's

## 2014-02-18 NOTE — Telephone Encounter (Signed)
Left message with normal DVT, if any question return call.

## 2014-03-07 ENCOUNTER — Other Ambulatory Visit: Payer: Self-pay | Admitting: Internal Medicine

## 2014-03-29 ENCOUNTER — Encounter (HOSPITAL_COMMUNITY): Payer: Self-pay

## 2014-03-29 ENCOUNTER — Emergency Department (HOSPITAL_COMMUNITY): Payer: BLUE CROSS/BLUE SHIELD

## 2014-03-29 ENCOUNTER — Observation Stay (HOSPITAL_COMMUNITY)
Admission: EM | Admit: 2014-03-29 | Discharge: 2014-04-01 | Disposition: A | Discharge status: Home / Self Care | Payer: BLUE CROSS/BLUE SHIELD | Attending: Internal Medicine | Admitting: Internal Medicine

## 2014-03-29 DIAGNOSIS — I509 Heart failure, unspecified: Secondary | ICD-10-CM

## 2014-03-29 DIAGNOSIS — Z794 Long term (current) use of insulin: Secondary | ICD-10-CM | POA: Diagnosis not present

## 2014-03-29 DIAGNOSIS — D631 Anemia in chronic kidney disease: Secondary | ICD-10-CM | POA: Insufficient documentation

## 2014-03-29 DIAGNOSIS — R197 Diarrhea, unspecified: Secondary | ICD-10-CM | POA: Diagnosis not present

## 2014-03-29 DIAGNOSIS — D649 Anemia, unspecified: Secondary | ICD-10-CM

## 2014-03-29 DIAGNOSIS — N186 End stage renal disease: Secondary | ICD-10-CM | POA: Diagnosis not present

## 2014-03-29 DIAGNOSIS — E1065 Type 1 diabetes mellitus with hyperglycemia: Secondary | ICD-10-CM | POA: Diagnosis not present

## 2014-03-29 DIAGNOSIS — R0902 Hypoxemia: Secondary | ICD-10-CM

## 2014-03-29 DIAGNOSIS — I5043 Acute on chronic combined systolic (congestive) and diastolic (congestive) heart failure: Secondary | ICD-10-CM | POA: Diagnosis not present

## 2014-03-29 DIAGNOSIS — E785 Hyperlipidemia, unspecified: Secondary | ICD-10-CM | POA: Insufficient documentation

## 2014-03-29 DIAGNOSIS — I120 Hypertensive chronic kidney disease with stage 5 chronic kidney disease or end stage renal disease: Principal | ICD-10-CM | POA: Insufficient documentation

## 2014-03-29 DIAGNOSIS — E1022 Type 1 diabetes mellitus with diabetic chronic kidney disease: Secondary | ICD-10-CM

## 2014-03-29 DIAGNOSIS — E876 Hypokalemia: Secondary | ICD-10-CM

## 2014-03-29 DIAGNOSIS — N189 Chronic kidney disease, unspecified: Secondary | ICD-10-CM

## 2014-03-29 DIAGNOSIS — I10 Essential (primary) hypertension: Secondary | ICD-10-CM | POA: Diagnosis present

## 2014-03-29 DIAGNOSIS — Z7982 Long term (current) use of aspirin: Secondary | ICD-10-CM | POA: Insufficient documentation

## 2014-03-29 DIAGNOSIS — I5042 Chronic combined systolic (congestive) and diastolic (congestive) heart failure: Secondary | ICD-10-CM

## 2014-03-29 DIAGNOSIS — IMO0002 Reserved for concepts with insufficient information to code with codable children: Secondary | ICD-10-CM | POA: Diagnosis present

## 2014-03-29 DIAGNOSIS — R0600 Dyspnea, unspecified: Secondary | ICD-10-CM

## 2014-03-29 DIAGNOSIS — E1029 Type 1 diabetes mellitus with other diabetic kidney complication: Secondary | ICD-10-CM | POA: Diagnosis present

## 2014-03-29 DIAGNOSIS — R109 Unspecified abdominal pain: Secondary | ICD-10-CM

## 2014-03-29 DIAGNOSIS — Z992 Dependence on renal dialysis: Secondary | ICD-10-CM | POA: Insufficient documentation

## 2014-03-29 HISTORY — DX: Dependence on renal dialysis: Z99.2

## 2014-03-29 HISTORY — DX: Disorder of kidney and ureter, unspecified: N28.9

## 2014-03-29 LAB — COMPREHENSIVE METABOLIC PANEL
ALK PHOS: 115 U/L (ref 39–117)
ALT: 22 U/L (ref 0–35)
AST: 29 U/L (ref 0–37)
Albumin: 2.8 g/dL — ABNORMAL LOW (ref 3.5–5.2)
Anion gap: 5 (ref 5–15)
BUN: 16 mg/dL (ref 6–23)
CO2: 32 mmol/L (ref 19–32)
Calcium: 8.6 mg/dL (ref 8.4–10.5)
Chloride: 101 mmol/L (ref 96–112)
Creatinine, Ser: 4.03 mg/dL — ABNORMAL HIGH (ref 0.50–1.10)
GFR calc Af Amer: 14 mL/min — ABNORMAL LOW (ref >90)
GFR calc non Af Amer: 12 mL/min — ABNORMAL LOW (ref >90)
GLUCOSE: 181 mg/dL — AB (ref 70–99)
POTASSIUM: 3 mmol/L — AB (ref 3.5–5.1)
Sodium: 138 mmol/L (ref 135–145)
Total Bilirubin: 0.4 mg/dL (ref 0.3–1.2)
Total Protein: 5.9 g/dL — ABNORMAL LOW (ref 6.0–8.3)

## 2014-03-29 LAB — I-STAT TROPONIN, ED: Troponin i, poc: 0.02 ng/mL (ref 0.00–0.08)

## 2014-03-29 LAB — CBC WITH DIFFERENTIAL/PLATELET
BASOS ABS: 0 10*3/uL (ref 0.0–0.1)
BASOS PCT: 0 % (ref 0–1)
Eosinophils Absolute: 0.3 10*3/uL (ref 0.0–0.7)
Eosinophils Relative: 4 % (ref 0–5)
HCT: 36 % (ref 36.0–46.0)
Hemoglobin: 11.3 g/dL — ABNORMAL LOW (ref 12.0–15.0)
Lymphocytes Relative: 11 % — ABNORMAL LOW (ref 12–46)
Lymphs Abs: 0.8 10*3/uL (ref 0.7–4.0)
MCH: 28.1 pg (ref 26.0–34.0)
MCHC: 31.4 g/dL (ref 30.0–36.0)
MCV: 89.6 fL (ref 78.0–100.0)
MONO ABS: 0.4 10*3/uL (ref 0.1–1.0)
Monocytes Relative: 6 % (ref 3–12)
NEUTROS PCT: 79 % — AB (ref 43–77)
Neutro Abs: 6 10*3/uL (ref 1.7–7.7)
Platelets: 168 10*3/uL (ref 150–400)
RBC: 4.02 MIL/uL (ref 3.87–5.11)
RDW: 16.5 % — ABNORMAL HIGH (ref 11.5–15.5)
WBC: 7.6 10*3/uL (ref 4.0–10.5)

## 2014-03-29 LAB — GLUCOSE, CAPILLARY: Glucose-Capillary: 221 mg/dL — ABNORMAL HIGH (ref 70–99)

## 2014-03-29 LAB — URINALYSIS, ROUTINE W REFLEX MICROSCOPIC
Bilirubin Urine: NEGATIVE
Glucose, UA: 500 mg/dL — AB
Ketones, ur: 15 mg/dL — AB
LEUKOCYTES UA: NEGATIVE
Nitrite: NEGATIVE
Protein, ur: >300 mg/dL — AB
SPECIFIC GRAVITY, URINE: 1.019 (ref 1.005–1.030)
Urobilinogen, UA: 0.2 mg/dL (ref 0.0–1.0)
pH: 7 (ref 5.0–8.0)

## 2014-03-29 LAB — URINE MICROSCOPIC-ADD ON

## 2014-03-29 LAB — LIPASE, BLOOD: Lipase: 29 U/L (ref 11–59)

## 2014-03-29 MED ORDER — HYDRALAZINE HCL 50 MG PO TABS
100.0000 mg | ORAL_TABLET | Freq: Three times a day (TID) | ORAL | Status: DC
  Administered 2014-03-29 – 2014-04-01 (×7): 100 mg via ORAL
  Filled 2014-03-29 (×10): qty 2

## 2014-03-29 MED ORDER — HEPARIN SODIUM (PORCINE) 5000 UNIT/ML IJ SOLN
5000.0000 [IU] | Freq: Three times a day (TID) | INTRAMUSCULAR | Status: DC
  Administered 2014-03-30 – 2014-03-31 (×5): 5000 [IU] via SUBCUTANEOUS
  Filled 2014-03-29 (×10): qty 1

## 2014-03-29 MED ORDER — ASPIRIN EC 81 MG PO TBEC
81.0000 mg | DELAYED_RELEASE_TABLET | Freq: Every day | ORAL | Status: DC
  Administered 2014-03-30 – 2014-04-01 (×3): 81 mg via ORAL
  Filled 2014-03-29 (×3): qty 1

## 2014-03-29 MED ORDER — SODIUM CHLORIDE 0.9 % IJ SOLN
3.0000 mL | Freq: Two times a day (BID) | INTRAMUSCULAR | Status: DC
  Administered 2014-03-29 – 2014-03-31 (×4): 3 mL via INTRAVENOUS

## 2014-03-29 MED ORDER — LISINOPRIL 5 MG PO TABS
5.0000 mg | ORAL_TABLET | Freq: Every day | ORAL | Status: DC
  Administered 2014-03-30 – 2014-03-31 (×2): 5 mg via ORAL
  Filled 2014-03-29 (×2): qty 1

## 2014-03-29 MED ORDER — FUROSEMIDE 10 MG/ML IJ SOLN
60.0000 mg | Freq: Once | INTRAMUSCULAR | Status: AC
  Administered 2014-03-29: 60 mg via INTRAVENOUS
  Filled 2014-03-29: qty 6

## 2014-03-29 MED ORDER — ACETAMINOPHEN 325 MG PO TABS
650.0000 mg | ORAL_TABLET | Freq: Four times a day (QID) | ORAL | Status: DC | PRN
  Administered 2014-03-29 – 2014-03-31 (×2): 650 mg via ORAL
  Filled 2014-03-29 (×2): qty 2

## 2014-03-29 MED ORDER — ONDANSETRON HCL 4 MG PO TABS: 4.0000 mg | ORAL_TABLET | Freq: Four times a day (QID) | ORAL | Status: DC | PRN

## 2014-03-29 MED ORDER — IPRATROPIUM-ALBUTEROL 0.5-2.5 (3) MG/3ML IN SOLN
3.0000 mL | RESPIRATORY_TRACT | Status: DC
  Administered 2014-03-29 – 2014-03-30 (×7): 3 mL via RESPIRATORY_TRACT
  Filled 2014-03-29 (×7): qty 3

## 2014-03-29 MED ORDER — FLUOXETINE HCL 20 MG PO CAPS
40.0000 mg | ORAL_CAPSULE | Freq: Every day | ORAL | Status: DC
  Administered 2014-03-30 – 2014-04-01 (×3): 40 mg via ORAL
  Filled 2014-03-29 (×3): qty 2

## 2014-03-29 MED ORDER — METHYLPREDNISOLONE SODIUM SUCC 125 MG IJ SOLR
40.0000 mg | Freq: Once | INTRAMUSCULAR | Status: AC
  Administered 2014-03-29: 40 mg via INTRAVENOUS
  Filled 2014-03-29: qty 2

## 2014-03-29 MED ORDER — ONDANSETRON HCL 4 MG/2ML IJ SOLN: 4.0000 mg | Freq: Four times a day (QID) | INTRAMUSCULAR | Status: DC | PRN

## 2014-03-29 MED ORDER — PANTOPRAZOLE SODIUM 40 MG PO TBEC
40.0000 mg | DELAYED_RELEASE_TABLET | Freq: Every day | ORAL | Status: DC
  Administered 2014-03-31 (×2): 40 mg via ORAL
  Filled 2014-03-29 (×3): qty 1

## 2014-03-29 MED ORDER — NITROGLYCERIN 2 % TD OINT
1.0000 [in_us] | TOPICAL_OINTMENT | Freq: Four times a day (QID) | TRANSDERMAL | Status: DC
  Administered 2014-03-29 – 2014-03-30 (×3): 1 [in_us] via TOPICAL
  Filled 2014-03-29: qty 1
  Filled 2014-03-29: qty 30

## 2014-03-29 MED ORDER — INSULIN ASPART 100 UNIT/ML ~~LOC~~ SOLN
0.0000 [IU] | SUBCUTANEOUS | Status: DC
  Administered 2014-03-30: 7 [IU] via SUBCUTANEOUS
  Administered 2014-03-30 (×2): 3 [IU] via SUBCUTANEOUS

## 2014-03-29 MED ORDER — CALCIUM CARBONATE ANTACID 500 MG PO CHEW
1.0000 | CHEWABLE_TABLET | Freq: Three times a day (TID) | ORAL | Status: DC
  Administered 2014-03-31 – 2014-04-01 (×4): 200 mg via ORAL
  Filled 2014-03-29 (×10): qty 1

## 2014-03-29 MED ORDER — CARVEDILOL 25 MG PO TABS: 25.0000 mg | ORAL_TABLET | Freq: Two times a day (BID) | ORAL | Status: DC

## 2014-03-29 MED ORDER — PRAVASTATIN SODIUM 40 MG PO TABS
40.0000 mg | ORAL_TABLET | Freq: Every day | ORAL | Status: DC
  Administered 2014-03-30 – 2014-04-01 (×3): 40 mg via ORAL
  Filled 2014-03-29 (×3): qty 1

## 2014-03-29 MED ORDER — GABAPENTIN 100 MG PO CAPS
200.0000 mg | ORAL_CAPSULE | Freq: Every day | ORAL | Status: DC
  Administered 2014-03-29 – 2014-03-31 (×3): 200 mg via ORAL
  Filled 2014-03-29 (×4): qty 2

## 2014-03-29 MED ORDER — CARVEDILOL 25 MG PO TABS
25.0000 mg | ORAL_TABLET | Freq: Two times a day (BID) | ORAL | Status: DC
  Administered 2014-03-29 – 2014-04-01 (×5): 25 mg via ORAL
  Filled 2014-03-29 (×8): qty 1

## 2014-03-29 MED ORDER — FUROSEMIDE 80 MG PO TABS
80.0000 mg | ORAL_TABLET | Freq: Two times a day (BID) | ORAL | Status: DC
  Administered 2014-03-30 – 2014-04-01 (×4): 80 mg via ORAL
  Filled 2014-03-29 (×7): qty 1

## 2014-03-29 MED ORDER — POTASSIUM CHLORIDE CRYS ER 20 MEQ PO TBCR
40.0000 meq | EXTENDED_RELEASE_TABLET | Freq: Once | ORAL | Status: AC
Start: 2014-03-29 — End: 2014-03-29
  Administered 2014-03-29: 40 meq via ORAL
  Filled 2014-03-29: qty 2

## 2014-03-29 NOTE — ED Notes (Signed)
Dr Radford PaxBeaton at bedside using US.

## 2014-03-29 NOTE — ED Provider Notes (Signed)
CSN: [id number]     Arrival date & time 03/29/14  1332 History   First MD Initiated Contact with Patient 03/29/14 1613     Chief Complaint  Patient presents with  . Abdominal Pain      HPI Pt has been having abd pain for the past 3 months and can't stand it anymore. Every time she eats she has diarrhea. Also reports nausea and vomiting. Feels SOB and can't breathe. Pt is a dialysis pt. Pt is T, Th, and Sat pt. Had a full treatment yesterday of 3 hr 30 in.  Past Medical History  Diagnosis Date  . Obesity   . Macular degeneration disease   . Depression   . High cholesterol Dx 2005  . Hypertension Dx 0254  . 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 Renal disorder   . Dialysis patient    Past Surgical History  Procedure Laterality Date  . Av fistula placement Left 12/19/2013    Procedure: Creation of Left Arm BRACHIOCEPHALIC ARTERIOVENOUS FISTULA ;  Surgeon: Conrad Calico Rock, MD;  Location: [facility] OR;  Service: Vascular;  Laterality: Left;   Family History  Problem Relation Age of Onset  . Hypertension Father   . Diabetes Father   . Kidney failure Father 25  . Kidney failure Sister 72  . Kidney failure Brother 52  . Kidney failure Sister 17   History  Substance Use Topics  . Smoking status: Never Smoker   . Smokeless tobacco: Not on file  . Alcohol Use: No   OB History    No data available     Review of Systems  Unable to perform ROS: Other      Allergies  Hydromorphone; Latex; and Tape  Home Medications   Prior to Admission medications   Medication Sig Start Date End Date Taking? Authorizing Provider  acetaminophen (TYLENOL) 325 MG tablet Take 650 mg by mouth every 6 (six) hours as needed (pain).   Yes Historical Provider, MD  aspirin EC 81 MG tablet Take 81 mg by mouth daily.   Yes Historical Provider, MD  calcium carbonate (TUMS) 500 MG chewable tablet Chew 1 tablet (200 mg of  elemental calcium total) by mouth 3 (three) times daily with meals. 12/20/13  Yes Charlott Rakes, MD  carvedilol (COREG) 25 MG tablet Take 1 tablet (25 mg total) by mouth 2 (two) times daily with a meal. 12/31/13  Yes Lance Bosch, NP  FLUoxetine (PROZAC) 20 MG capsule Take 1 capsule (20 mg total) by mouth daily. Patient taking differently: Take 40 mg by mouth daily.  12/20/13  Yes Charlott Rakes, MD  furosemide (LASIX) 80 MG tablet TAKE 1 TABLET BY MOUTH 2 TIMES DAILY 03/08/14  Yes Lance Bosch, NP  gabapentin (NEURONTIN) 100 MG capsule Take 2 capsules (200 mg total) by mouth 3 (three) times daily. Patient taking differently: Take 200 mg by mouth at bedtime.  12/20/13  Yes Charlott Rakes, MD  hydrALAZINE (APRESOLINE) 100 MG tablet Take 100 mg by mouth 3 (three) times daily.   Yes Historical Provider, MD  insulin NPH Human (HUMULIN N,NOVOLIN N) 100 UNIT/ML injection Inject 0.08-0.24 mLs (8-24 Units total) into the skin 2 (two) times daily. 8 units in the morning and 24 units at after supper Patient taking differently: Inject 8-10 Units into the skin 2 (two) times daily. Inject 10 units every morning and 8 units every  night 12/31/13  Yes Lance Bosch, NP  insulin regular (NOVOLIN R,HUMULIN R) 100 units/mL injection Inject 0.1-0.16 mLs (10-16 Units total) into the skin 3 (three) times daily before meals. 16 units every morning 16 units at lunch 10 units at bedtime.  Per sliding scale Patient taking differently: Inject 0-12 Units into the skin 3 (three) times daily before meals. Per sliding scale 12/31/13  Yes Lance Bosch, NP  isosorbide mononitrate (IMDUR) 30 MG 24 hr tablet Take 30 mg by mouth daily.  03/15/14  Yes Historical Provider, MD  lisinopril (PRINIVIL,ZESTRIL) 5 MG tablet Take 5 mg by mouth daily.  01/05/14  Yes Historical Provider, MD  omeprazole (PRILOSEC) 20 MG capsule Take 20 mg by mouth daily.  01/05/14  Yes Historical Provider, MD  pravastatin (PRAVACHOL) 40 MG tablet Take 40 mg by mouth  daily.   Yes Historical Provider, MD  amoxicillin (AMOXIL) 250 MG capsule Take 1 capsule (250 mg total) by mouth daily. Patient not taking: Reported on 03/29/2014 02/11/14   Lance Bosch, NP  glucose blood (FREESTYLE INSULINX TEST) test strip Use as instructed 01/18/14   Tresa Garter, MD  glucose blood test strip Use as instructed 3 times daily 01/17/14   Tresa Garter, MD  glucose monitoring kit (FREESTYLE) monitoring kit 1 each by Does not apply route as needed for other. E11.9  Test up to 4 times daily 01/18/14   Tresa Garter, MD  hydrALAZINE (APRESOLINE) 50 MG tablet Take 1 tablet (50 mg total) by mouth 3 (three) times daily. Patient not taking: Reported on 03/29/2014 02/04/14   Lance Bosch, NP  Lancets (FREESTYLE) lancets Use as instructed 01/18/14   Tresa Garter, MD  lovastatin (MEVACOR) 20 MG tablet Take 1 tablet (20 mg total) by mouth at bedtime. Patient not taking: Reported on 03/29/2014 12/20/13   Charlott Rakes, MD  ranitidine (ZANTAC) 150 MG tablet Take 1 tablet (150 mg total) by mouth 2 (two) times daily. Patient not taking: Reported on 03/29/2014 12/20/13   Charlott Rakes, MD   BP 190/78 mmHg  Pulse 89  Temp(Src) 98.6 F (37 C) (Oral)  Resp 17  Ht 5' (1.524 m)  Wt 191 lb 8 oz (86.864 kg)  BMI 37.40 kg/m2  SpO2 89% Physical Exam  Constitutional: She is oriented to person, place, and time. She appears well-developed and well-nourished. No distress.  HENT:  Head: Normocephalic and atraumatic.  Eyes: Pupils are equal, round, and reactive to light.  Neck: Normal range of motion.  Cardiovascular: Normal rate and intact distal pulses.   Pulmonary/Chest: No respiratory distress. She has wheezes.  Abdominal: Normal appearance. She exhibits no distension.  Musculoskeletal: Normal range of motion.  Neurological: She is alert and oriented to person, place, and time. No cranial nerve deficit.  Skin: Skin is warm and dry. No rash noted.  Psychiatric: She has  a normal mood and affect. Her behavior is normal.  Nursing note and vitals reviewed.   ED Course  Procedures (including critical care time)  Medications  ipratropium-albuterol (DUONEB) 0.5-2.5 (3) MG/3ML nebulizer solution 3 mL (3 mLs Nebulization Given 03/30/14 0943)  aspirin EC tablet 81 mg (not administered)  hydrALAZINE (APRESOLINE) tablet 100 mg (100 mg Oral Given 03/29/14 2336)  lisinopril (PRINIVIL,ZESTRIL) tablet 5 mg (not administered)  pantoprazole (PROTONIX) EC tablet 40 mg (not administered)  pravastatin (PRAVACHOL) tablet 40 mg (not administered)  calcium carbonate (TUMS - dosed in mg elemental calcium) chewable tablet 200 mg of elemental calcium (not  administered)  FLUoxetine (PROZAC) capsule 40 mg (not administered)  gabapentin (NEURONTIN) capsule 200 mg (200 mg Oral Given 03/29/14 2336)  furosemide (LASIX) tablet 80 mg (not administered)  insulin aspart (novoLOG) injection 0-9 Units (3 Units Subcutaneous Given 03/30/14 0414)  heparin injection 5,000 Units (5,000 Units Subcutaneous Given 03/30/14 0615)  sodium chloride 0.9 % injection 3 mL (3 mLs Intravenous Given 03/29/14 2337)  ondansetron (ZOFRAN) tablet 4 mg (not administered)    Or  ondansetron (ZOFRAN) injection 4 mg (not administered)  carvedilol (COREG) tablet 25 mg (25 mg Oral Given 03/29/14 2335)  acetaminophen (TYLENOL) tablet 650 mg (650 mg Oral Given 03/29/14 2331)  benzonatate (TESSALON) capsule 100 mg (100 mg Oral Given 03/30/14 0415)  methylPREDNISolone sodium succinate (SOLU-MEDROL) 125 mg/2 mL injection 40 mg (40 mg Intravenous Given 03/29/14 2132)  potassium chloride SA (K-DUR,KLOR-CON) CR tablet 40 mEq (40 mEq Oral Given 03/29/14 2132)  furosemide (LASIX) injection 60 mg (60 mg Intravenous Given 03/29/14 2331)    Labs Review Labs Reviewed  CBC WITH DIFFERENTIAL/PLATELET - Abnormal; Notable for the following:    Hemoglobin 11.3 (*)    RDW 16.5 (*)    Neutrophils Relative % 79 (*)    Lymphocytes Relative 11  (*)    All other components within normal limits  COMPREHENSIVE METABOLIC PANEL - Abnormal; Notable for the following:    Potassium 3.0 (*)    Glucose, Bld 181 (*)    Creatinine, Ser 4.03 (*)    Total Protein 5.9 (*)    Albumin 2.8 (*)    GFR calc non Af Amer 12 (*)    GFR calc Af Amer 14 (*)    All other components within normal limits  URINALYSIS, ROUTINE W REFLEX MICROSCOPIC - Abnormal; Notable for the following:    Glucose, UA 500 (*)    Hgb urine dipstick TRACE (*)    Ketones, ur 15 (*)    Protein, ur >300 (*)    All other components within normal limits  URINE MICROSCOPIC-ADD ON - Abnormal; Notable for the following:    Casts HYALINE CASTS (*)    All other components within normal limits  BASIC METABOLIC PANEL - Abnormal; Notable for the following:    Sodium 133 (*)    Glucose, Bld 255 (*)    Creatinine, Ser 4.91 (*)    GFR calc non Af Amer 9 (*)    GFR calc Af Amer 11 (*)    All other components within normal limits  CBC - Abnormal; Notable for the following:    Hemoglobin 11.4 (*)    HCT 35.7 (*)    RDW 16.5 (*)    All other components within normal limits  GLUCOSE, CAPILLARY - Abnormal; Notable for the following:    Glucose-Capillary 221 (*)    All other components within normal limits  GLUCOSE, CAPILLARY - Abnormal; Notable for the following:    Glucose-Capillary 234 (*)    All other components within normal limits  GLUCOSE, CAPILLARY - Abnormal; Notable for the following:    Glucose-Capillary 214 (*)    All other components within normal limits  MRSA PCR SCREENING  RESPIRATORY VIRUS PANEL  CLOSTRIDIUM DIFFICILE BY PCR  LIPASE, BLOOD  HEMOGLOBIN A1C  I-STAT TROPOININ, ED    Imaging Review Dg Abd Acute W/chest  03/29/2014   CLINICAL DATA:  54 year old female complaining of nausea and vomiting and intermittent chest pain for several days. Cough.  EXAM: ACUTE ABDOMEN SERIES (ABDOMEN 2 VIEW & CHEST 1 VIEW)  COMPARISON:  Chest x-ray 12/14/2013.  FINDINGS: Lung  volumes are normal. No consolidative airspace disease. No pleural effusions. Linear bibasilar opacities favored to reflect subsegmental atelectasis. There is cephalization of the pulmonary vasculature and slight indistinctness of the interstitial markings suggestive of mild pulmonary edema. Enlargement of the cardiopericardial silhouette, which could be related to cardiomegaly, however, the heart has somewhat of a "water bottle" appearance, which could indicate underlying pericardial effusion. Upper mediastinal contours are within normal limits. Right internal jugular PermCath with tip terminating in the distal superior vena cava.  Gas and stool are noted throughout the colon extending to the distal rectum. No pathologic dilatation of small bowel. Several nondilated gas-filled loops of small bowel are noted in the central abdomen, which is nonspecific. No air-fluid levels. No pneumoperitoneum. Hepatomegaly (craniocaudal span of the liver is approximately 27 cm).  IMPRESSION: 1. Nonobstructive bowel gas pattern. 2. No pneumoperitoneum. 3. The appearance the chest suggests mild congestive heart failure, as above. 4. The appearance of the heart may simply reflect underlying cardiomegaly, however, there are imaging findings which can be seen in the setting of a pericardial effusion. Correlation with echocardiography is suggested. 5. Hepatomegaly.   Electronically Signed   By: Vinnie Langton M.D.   On: 03/29/2014 17:50     EKG Interpretation   Date/Time:  Friday March 29 2014 13:50:48 EST Ventricular Rate:  78 PR Interval:  140 QRS Duration: 92 QT Interval:  428 QTC Calculation: 487 R Axis:   65 Text Interpretation:  Normal sinus rhythm Prolonged QT Abnormal ECG  Confirmed by BEATON  MD, ROBERT ([id number]) on 03/29/2014 4:48:25 PM       I spoke with nephrologist at [facility] about transfer because that is where patietn receives her care.  They were not able to accept because of bed capacity.   Talked with unassigned medicine and nephrology here.  Will plan on admitting with possible transfer tomorrow or dialysis here if necessary.  Patient appears stable during ED stay. MDM   Final diagnoses:  Abdominal pain  Congestive heart failure, unspecified congestive heart failure chronicity, unspecified congestive heart failure type  Hypoxia        Dot Lanes, MD 03/30/14 1059

## 2014-03-29 NOTE — H&P (Signed)
Date: 03/29/2014               Patient Name:  Joan Young MRN: [id number]  DOB: 01/27/1961 Age / Sex: 54 y.o., female   PCP: Tresa Garter, MD         Medical Service: Internal Medicine Teaching Service         Attending Physician: Dr. Aldine Contes, MD    First Contact: Dr. Julious Oka Pager: [phone]  Second Contact: Dr. Bing Neighbors Pager: [phone]       After Hours (After 5p/  First Contact Pager: [phone]  weekends / holidays): Second Contact Pager: [phone]   Chief Complaint: SOB  History of Present Illness: Joan Young is a 54 year old Spanish speaking woman with ESRD on T/Th/Sa HD, DM1, CHF, HTN, HL who presents with SOB. History obtained with assistance of daughter. She was in her usual state of health until about 2 weeks ago when she noticed an increase in her baseline SOB as well as chest tightness and wheezing. She also has had a mostly dry cough that is rarely productive of yellow sputum as well as a sore throat. She notes three pillow orthopnea. She was seen by her cardiologist this last Wednesday 03/27/14 who had a TEE done with EF 45-50% but unable to assess ventricular wall motion due to imaging difficulty. She is  scheduled for catheterization on Monday 04/01/14 (cardiologist Dr. Mercie Eon). She tolerated HD well yesterday and is unsure if they took any fluid off. She was last seen at [facility] for admission 10/14-10/22/15 for acute on chronic combined CHF where she was diuresed 13L and lost 20 lbs.  Joan Young also noted that she has had a bloated feeling in her abdomen for months that is not quite pain. She also has diarrhea over this time period but denies melena and hematachezia. She has been eating less as she thinks eating aggravates her diarrhea. She also occasionally has nausea with emesis. She has been getting HD for about three months. She says she was treated with an unknown antibiotic about a month ago for a "blood infection." She denies any  fevers, chills, body aches, recent sick contacts, previous respiratory issues such as pneumonias or asthma. She has gotten her flu and pneumonia vaccines. She says there have been no issues taking her medicines except that she is non-compliant with isosorbide dinitrate as it makes her feel dizzy.  In the ED, she received 1 inch nitroglycerin paste with subsequent drop in BP from 204/75. She became hypoxic in the ED when taken off 2L nasal cannula to the 80s. She receives her medical care primarily at [facility] but was told they have no beds so came to [facility].  Meds: Current Facility-Administered Medications  Medication Dose Route Frequency Provider Last Rate Last Dose  . ipratropium-albuterol (DUONEB) 0.5-2.5 (3) MG/3ML nebulizer solution 3 mL  3 mL Nebulization Q4H Otho Bellows, MD   3 mL at 03/29/14 2132  . nitroGLYCERIN (NITROGLYN) 2 % ointment 1 inch  1 inch Topical 4 times per day Dot Lanes, MD   1 inch at 03/29/14 1903   Current Outpatient Prescriptions  Medication Sig Dispense Refill  . acetaminophen (TYLENOL) 325 MG tablet Take 650 mg by mouth every 6 (six) hours as needed (pain).    Marland Kitchen aspirin EC 81 MG tablet Take 81 mg by mouth daily.    . calcium carbonate (TUMS) 500 MG chewable tablet Chew 1 tablet (200 mg of elemental calcium total) by  mouth 3 (three) times daily with meals. 90 tablet 2  . carvedilol (COREG) 25 MG tablet Take 1 tablet (25 mg total) by mouth 2 (two) times daily with a meal. 60 tablet 3  . FLUoxetine (PROZAC) 20 MG capsule Take 1 capsule (20 mg total) by mouth daily. (Patient taking differently: Take 40 mg by mouth daily. ) 30 capsule 2  . furosemide (LASIX) 80 MG tablet TAKE 1 TABLET BY MOUTH 2 TIMES DAILY 60 tablet 1  . gabapentin (NEURONTIN) 100 MG capsule Take 2 capsules (200 mg total) by mouth 3 (three) times daily. (Patient taking differently: Take 200 mg by mouth at bedtime. ) 180 capsule 0  . hydrALAZINE (APRESOLINE) 100 MG tablet Take 100 mg by mouth 3  (three) times daily.    . insulin NPH Human (HUMULIN N,NOVOLIN N) 100 UNIT/ML injection Inject 0.08-0.24 mLs (8-24 Units total) into the skin 2 (two) times daily. 8 units in the morning and 24 units at after supper (Patient taking differently: Inject 8-10 Units into the skin 2 (two) times daily. Inject 10 units every morning and 8 units every night) 10 mL 3  . insulin regular (NOVOLIN R,HUMULIN R) 100 units/mL injection Inject 0.1-0.16 mLs (10-16 Units total) into the skin 3 (three) times daily before meals. 16 units every morning 16 units at lunch 10 units at bedtime.  Per sliding scale (Patient taking differently: Inject 0-12 Units into the skin 3 (three) times daily before meals. Per sliding scale) 10 mL 3  . isosorbide mononitrate (IMDUR) 30 MG 24 hr tablet Take 30 mg by mouth daily.     Marland Kitchen lisinopril (PRINIVIL,ZESTRIL) 5 MG tablet Take 5 mg by mouth daily.   2  . omeprazole (PRILOSEC) 20 MG capsule Take 20 mg by mouth daily.   2  . pravastatin (PRAVACHOL) 40 MG tablet Take 40 mg by mouth daily.    Marland Kitchen amoxicillin (AMOXIL) 250 MG capsule Take 1 capsule (250 mg total) by mouth daily. (Patient not taking: Reported on 03/29/2014) 10 capsule 0  . glucose blood (FREESTYLE INSULINX TEST) test strip Use as instructed 100 each 12  . glucose blood test strip Use as instructed 3 times daily 100 each 12  . glucose monitoring kit (FREESTYLE) monitoring kit 1 each by Does not apply route as needed for other. E11.9  Test up to 4 times daily 1 each 0  . hydrALAZINE (APRESOLINE) 50 MG tablet Take 1 tablet (50 mg total) by mouth 3 (three) times daily. (Patient not taking: Reported on 03/29/2014)    . Lancets (FREESTYLE) lancets Use as instructed 100 each 12  . lovastatin (MEVACOR) 20 MG tablet Take 1 tablet (20 mg total) by mouth at bedtime. (Patient not taking: Reported on 03/29/2014) 30 tablet 2  . ranitidine (ZANTAC) 150 MG tablet Take 1 tablet (150 mg total) by mouth 2 (two) times daily. (Patient not taking: Reported  on 03/29/2014) 60 tablet 2    Allergies: Allergies as of 03/29/2014 - Review Complete 03/29/2014  Allergen Reaction Noted  . Hydromorphone Itching 02/04/2014  . Latex Rash 02/04/2014  . Tape Rash 02/04/2014   Past Medical History  Diagnosis Date  . Obesity   . Macular degeneration disease   . Depression   . High cholesterol Dx 2005  . Hypertension Dx 1025  . Diabetes mellitus without complication DX 8527    type 36 since 54 years old but reports starting insulin at 54 years old  . Renal disorder   . Dialysis patient  Past Surgical History  Procedure Laterality Date  . Av fistula placement Left 12/19/2013    Procedure: Creation of Left Arm BRACHIOCEPHALIC ARTERIOVENOUS FISTULA ;  Surgeon: Conrad Bud, MD;  Location: [facility] OR;  Service: Vascular;  Laterality: Left;   Family History  Problem Relation Age of Onset  . Hypertension Father   . Diabetes Father   . Kidney failure Father 30  . Kidney failure Sister 39  . Kidney failure Brother 46  . Kidney failure Sister 70   History   Social History  . Marital Status: Married    Spouse Name: N/A    Number of Children: N/A  . Years of Education: 6th grade   Occupational History  . homemaker    Social History Main Topics  . Smoking status: Never Smoker   . Smokeless tobacco: Not on file  . Alcohol Use: No  . Drug Use: No  . Sexual Activity: Not on file   Other Topics Concern  . Not on file   Social History Narrative    Review of Systems: A comprehensive review of systems was negative except for: as noted above per HPI  Physical Exam: Blood pressure 181/77, pulse 85, temperature 97.9 F (36.6 C), temperature source Oral, resp. rate 17, SpO2 95 %.  Gen: A&O x 4, no acute distress, well developed, well nourished HEENT: Atraumatic, PERRL, EOMI, sclerae anicteric, moist mucous membranes Heart: Regular rate and rhythm, normal S1 S2, no murmurs, rubs, or gallops Lungs: Diffuse expiratory wheezes bilaterally,  questionable bibasilar crackles, respirations unlabored Abd: Soft, non-tender, non-distended, + bowel sounds, no hepatosplenomegaly Ext: 1+ b/l pitting edema up to shins, R arm AVF with thrill  Lab results: Basic Metabolic Panel:  Recent Labs  03/29/14 1349  NA 138  K 3.0*  CL 101  CO2 32  GLUCOSE 181*  BUN 16  CREATININE 4.03*  CALCIUM 8.6   Liver Function Tests:  Recent Labs  03/29/14 1349  AST 29  ALT 22  ALKPHOS 115  BILITOT 0.4  PROT 5.9*  ALBUMIN 2.8*    Recent Labs  03/29/14 1349  LIPASE 29   CBC:  Recent Labs  03/29/14 1349  WBC 7.6  NEUTROABS 6.0  HGB 11.3*  HCT 36.0  MCV 89.6  PLT 168   Urinalysis:  Recent Labs  03/29/14 1812  COLORURINE YELLOW  LABSPEC 1.019  PHURINE 7.0  GLUCOSEU 500*  HGBUR TRACE*  BILIRUBINUR NEGATIVE  KETONESUR 15*  PROTEINUR >300*  UROBILINOGEN 0.2  NITRITE NEGATIVE  LEUKOCYTESUR NEGATIVE   i-stat troponin 0.00  Imaging results:  Dg Abd Acute W/chest  03/29/2014   CLINICAL DATA:  54 year old female complaining of nausea and vomiting and intermittent chest pain for several days. Cough.  EXAM: ACUTE ABDOMEN SERIES (ABDOMEN 2 VIEW & CHEST 1 VIEW)  COMPARISON:  Chest x-ray 12/14/2013.  FINDINGS: Lung volumes are normal. No consolidative airspace disease. No pleural effusions. Linear bibasilar opacities favored to reflect subsegmental atelectasis. There is cephalization of the pulmonary vasculature and slight indistinctness of the interstitial markings suggestive of mild pulmonary edema. Enlargement of the cardiopericardial silhouette, which could be related to cardiomegaly, however, the heart has somewhat of a "water bottle" appearance, which could indicate underlying pericardial effusion. Upper mediastinal contours are within normal limits. Right internal jugular PermCath with tip terminating in the distal superior vena cava.  Gas and stool are noted throughout the colon extending to the distal rectum. No pathologic  dilatation of small bowel. Several nondilated gas-filled loops of small bowel  are noted in the central abdomen, which is nonspecific. No air-fluid levels. No pneumoperitoneum. Hepatomegaly (craniocaudal span of the liver is approximately 27 cm).  IMPRESSION: 1. Nonobstructive bowel gas pattern. 2. No pneumoperitoneum. 3. The appearance the chest suggests mild congestive heart failure, as above. 4. The appearance of the heart may simply reflect underlying cardiomegaly, however, there are imaging findings which can be seen in the setting of a pericardial effusion. Correlation with echocardiography is suggested. 5. Hepatomegaly.   Electronically Signed   By: Vinnie Langton M.D.   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 This may be a combination of some fluid overload and a URI. Although she completed HD yesterday, she had possible bibasilar crackles on exam and noted pitting edema in lower extremities. Her CXR also noted cephalization of the pulmonary vasculature as well as cardiomegaly. Her last echo in our system was 12/13/13 notable for diffuse hypokinesis with EF 40-45% and grade 1 diastolic dysfunction. She also reports URI symptoms such as a mostly dry cough and sore throat without fevers, chills, myalgias. She was wheezy on exam but she denies any history of smoking or asthma. -solumedrol 40 mg iv once -duoneb 3 mL neb q4h -lasix 60 mg iv once tonight instead of her home dose of 80 mg po -continue lasix 80 mg bid po beginning tomorrow  morning -HD tomorrow -daily weights, I&Os -cardiac monitoring  #ESRD on HD: Joan Young reports starting HD about 12/2013 after developing uremic symptoms of fatigue, nausea, and metallic taste. She currently makes about 2 oz urine. She has a R AVF as well as a R IJ PermaCath in place. Her presenting creatinine was 4.03 with GFR 12 which is up from 7 three months ago. It is possible she has some fluid overload as described above. ED provider contacted nephrology who recommended reconsulting in the morning if primary team felt HD needed tomorrow. Her cardiologist Dr. Glori Luis thought she was volume expanded and needed more fluid removal with HD when seen in clinic two days ago. ESRD likely 2/2 long standing DM1 and HTN. -T/Th/Sa HD  #Combined CHF: See above re: dyspnea. Joan Dastrup's last echo was 12/13/13 notable for diffuse hypokinesis with EF 40-45% and grade 1 diastolic dysfunction. She is seen by Dr. Mercie Eon at [facility] who last had a TEE on 03/27/14 notable for EF 45-50% but limited study. He feels she has multivessel disease and needs ischemic work-up with cardiac catheterization scheduled for 04/01/14. At that time he increased her hydralazine to 100 mg tid and started the imdur 30 mg daily that she is non-compliant with. He felt she was volume expanded and needed fluid removal with HD at that time. She is on lasix 80 mg bid, coreg 25 mg bid, ASA 81 mg daily, lisinopril 5 mg daily -cont lasix 80 mg bid, coreg 25 mg bid, ASA 81 mg daily, lisinopril 5 mg daily -HD tomorrow -daily weights, I&Os  #HTN: BP has been elevated in the 170s-190s/70s-90s in the ED. She was given 1  inch nitro paste when BP 204/75 with drop to 125/95 which is now back up at 179/72. At home she is on ASA 81 mg daily, carvedilol 25 mg bid, lasix 80 mg bid, hydralazine 100 mg tid, lisinopril 5 mg daily, isosorbide mononitrate 30 mg daily, pravastatin 40 mg daily. She notes she is not taking isosorbide mononitrate as  it makes her feel weak. -cont ASA 81 mg daily, carvedilol 25 mg bid, lasix 80 mg bid, hydralazine 100 mg tid, lisinopril 5 mg daily, pravastatin 40 mg daily -hold isosorbide mononitrate 30 mg daily,  #Hypokalemia: Presenting K of 3.0. This may be related to her chronic diarrhea. She had HD yesterday and will have HD tomorrow. -KDur 40 mEq po once  #Diarrhea: This sounds like a chronic and stable issue but history unclear. She says she was on an antibiotic about a month ago for a "blood infection" but unsure which drug. Abdominal exam benign with normal liver function panel and lipase. AXR notes nonobstructive gas pattern and hepatomegaly. -c diff toxin -cont to monitor  #DM1: Joan Young has type 1 DM. Her last hemoglobin A1c was 8.3 in 11/2013. Her glucose appears fine as her presenting blood sugar today was 181. At home she takes NPH 8 u am and 24 u pm, novolin 16 u breakfast, 16 u lunch, 10 u dinner, and gabapentin 200 mg tid. -hold NPH -SSI-sensitive -cont gabapentin 200 mg tid -check hemoglobin A1c  #Anemia: Presenting hemoglobin of 11.3 improved from 10.8 3 months ago. This is stable and likely related to her ESRD. She denied any signs of active bleeding. On last admission to [facility], she received IV ferraheme and aranesp prior to beginning HD. -cont to monitor  #HL: No lipid profile in our system. She is on pravastatin 40 mg daily. -cont pravastatin 40 mg daily  #Diet: renal/carb modified w fluid restriction  #DVT PPx: heparin 5000 u [place] tid  #Code: full  Dispo: Disposition is deferred at this time, awaiting improvement of current medical problems. Anticipated discharge in approximately 1 day(s).   The patient does have a current PCP (Tresa Garter, MD) and does need an [facility] hospital follow-up appointment after discharge.  The patient does not know have transportation limitations that hinder transportation to clinic appointments.  Signed: Kelby Aline, MD 03/29/2014, 9:57  PM

## 2014-03-29 NOTE — Progress Notes (Signed)
Called ER RN for report. Roomready for admit. 

## 2014-03-29 NOTE — ED Notes (Signed)
Pt has been having abd pain for the past 3 months and can't stand it anymore. Every time she eats she has diarrhea. Also reports nausea and vomiting. Feels SOB and can't breathe. Pt is a dialysis pt. Pt is T, Th, and Sat pt. Had a full treatment yesterday of 3 hr 30 in. Pt speaks spanish and little english.

## 2014-03-29 NOTE — ED Notes (Signed)
Dr. Radford PaxBeaton at bedside with ultrasound.

## 2014-03-30 DIAGNOSIS — I313 Pericardial effusion (noninflammatory): Secondary | ICD-10-CM | POA: Diagnosis not present

## 2014-03-30 DIAGNOSIS — D638 Anemia in other chronic diseases classified elsewhere: Secondary | ICD-10-CM

## 2014-03-30 DIAGNOSIS — E1040 Type 1 diabetes mellitus with diabetic neuropathy, unspecified: Secondary | ICD-10-CM

## 2014-03-30 DIAGNOSIS — E1022 Type 1 diabetes mellitus with diabetic chronic kidney disease: Secondary | ICD-10-CM | POA: Diagnosis not present

## 2014-03-30 DIAGNOSIS — J918 Pleural effusion in other conditions classified elsewhere: Secondary | ICD-10-CM

## 2014-03-30 DIAGNOSIS — I5043 Acute on chronic combined systolic (congestive) and diastolic (congestive) heart failure: Secondary | ICD-10-CM

## 2014-03-30 DIAGNOSIS — I132 Hypertensive heart and chronic kidney disease with heart failure and with stage 5 chronic kidney disease, or end stage renal disease: Secondary | ICD-10-CM

## 2014-03-30 DIAGNOSIS — R0600 Dyspnea, unspecified: Secondary | ICD-10-CM | POA: Diagnosis not present

## 2014-03-30 LAB — GLUCOSE, CAPILLARY
GLUCOSE-CAPILLARY: 214 mg/dL — AB (ref 70–99)
Glucose-Capillary: 234 mg/dL — ABNORMAL HIGH (ref 70–99)
Glucose-Capillary: 342 mg/dL — ABNORMAL HIGH (ref 70–99)
Glucose-Capillary: 426 mg/dL — ABNORMAL HIGH (ref 70–99)

## 2014-03-30 LAB — BASIC METABOLIC PANEL
Anion gap: 8 (ref 5–15)
BUN: 21 mg/dL (ref 6–23)
CO2: 26 mmol/L (ref 19–32)
CREATININE: 4.91 mg/dL — AB (ref 0.50–1.10)
Calcium: 8.8 mg/dL (ref 8.4–10.5)
Chloride: 99 mmol/L (ref 96–112)
GFR calc Af Amer: 11 mL/min — ABNORMAL LOW (ref >90)
GFR, EST NON AFRICAN AMERICAN: 9 mL/min — AB (ref >90)
Glucose, Bld: 255 mg/dL — ABNORMAL HIGH (ref 70–99)
Potassium: 3.9 mmol/L (ref 3.5–5.1)
SODIUM: 133 mmol/L — AB (ref 135–145)

## 2014-03-30 LAB — CBC
HEMATOCRIT: 35.7 % — AB (ref 36.0–46.0)
HEMOGLOBIN: 11.4 g/dL — AB (ref 12.0–15.0)
MCH: 28.4 pg (ref 26.0–34.0)
MCHC: 31.9 g/dL (ref 30.0–36.0)
MCV: 89 fL (ref 78.0–100.0)
PLATELETS: 195 10*3/uL (ref 150–400)
RBC: 4.01 MIL/uL (ref 3.87–5.11)
RDW: 16.5 % — ABNORMAL HIGH (ref 11.5–15.5)
WBC: 8.5 10*3/uL (ref 4.0–10.5)

## 2014-03-30 LAB — CLOSTRIDIUM DIFFICILE BY PCR: Toxigenic C. Difficile by PCR: NEGATIVE

## 2014-03-30 LAB — MRSA PCR SCREENING: MRSA BY PCR: NEGATIVE

## 2014-03-30 MED ORDER — IPRATROPIUM-ALBUTEROL 0.5-2.5 (3) MG/3ML IN SOLN: 3.0000 mL | Freq: Four times a day (QID) | RESPIRATORY_TRACT | Status: DC | PRN

## 2014-03-30 MED ORDER — BENZONATATE 100 MG PO CAPS
100.0000 mg | ORAL_CAPSULE | Freq: Three times a day (TID) | ORAL | Status: DC | PRN
  Administered 2014-03-30 – 2014-04-01 (×4): 100 mg via ORAL
  Filled 2014-03-30 (×4): qty 1

## 2014-03-30 MED ORDER — NEPRO/CARBSTEADY PO LIQD: 237.0000 mL | ORAL | Status: DC | PRN

## 2014-03-30 MED ORDER — IPRATROPIUM-ALBUTEROL 0.5-2.5 (3) MG/3ML IN SOLN
3.0000 mL | Freq: Three times a day (TID) | RESPIRATORY_TRACT | Status: DC
  Administered 2014-03-31: 3 mL via RESPIRATORY_TRACT
  Filled 2014-03-30: qty 3

## 2014-03-30 MED ORDER — LIDOCAINE-PRILOCAINE 2.5-2.5 % EX CREA: 1.0000 "application " | TOPICAL_CREAM | CUTANEOUS | Status: DC | PRN

## 2014-03-30 MED ORDER — HEPARIN SODIUM (PORCINE) 1000 UNIT/ML DIALYSIS: 3600.0000 [IU] | Freq: Once | INTRAMUSCULAR | Status: DC

## 2014-03-30 MED ORDER — LIDOCAINE HCL (PF) 1 % IJ SOLN: 5.0000 mL | INTRAMUSCULAR | Status: DC | PRN

## 2014-03-30 MED ORDER — SODIUM CHLORIDE 0.9 % IV SOLN: 100.0000 mL | INTRAVENOUS | Status: DC | PRN

## 2014-03-30 MED ORDER — ALTEPLASE 2 MG IJ SOLR: 2.0000 mg | Freq: Once | INTRAMUSCULAR | Status: DC | PRN

## 2014-03-30 MED ORDER — HEPARIN SODIUM (PORCINE) 1000 UNIT/ML DIALYSIS: 1000.0000 [IU] | INTRAMUSCULAR | Status: DC | PRN

## 2014-03-30 MED ORDER — INSULIN ASPART 100 UNIT/ML ~~LOC~~ SOLN
0.0000 [IU] | SUBCUTANEOUS | Status: DC
  Administered 2014-03-30: 15 [IU] via SUBCUTANEOUS
  Administered 2014-03-31: 5 [IU] via SUBCUTANEOUS
  Administered 2014-03-31: 3 [IU] via SUBCUTANEOUS
  Administered 2014-03-31: 8 [IU] via SUBCUTANEOUS

## 2014-03-30 MED ORDER — PENTAFLUOROPROP-TETRAFLUOROETH EX AERO: 1.0000 "application " | INHALATION_SPRAY | CUTANEOUS | Status: DC | PRN

## 2014-03-30 NOTE — Progress Notes (Signed)
  Echocardiogram 2D Echocardiogram has been performed.  Janalyn HarderWest, Kannen Moxey R 03/30/2014, 4:48 PM

## 2014-03-30 NOTE — Progress Notes (Signed)
Subjective: Pt states that for the past 2 months whenever she eats she has diarrhea immediately after. She has a GI appt w/ [facility] in March. On 2L O2 Joan Young.   Objective: Vital signs in last 24 hours: Filed Vitals:   03/29/14 2306 03/30/14 0015 03/30/14 0520 03/30/14 0930  BP: 176/75  184/81 190/78  Pulse: 86  83 89  Temp: 98.2 F (36.8 C)  97.6 F (36.4 C) 98.6 F (37 C)  TempSrc: Oral  Oral Oral  Resp: Height: 5' (1.524 m)     Weight: 191 lb 8 oz (86.864 kg)     SpO2: 95% 97% 99% 89%   Weight change:   Intake/Output Summary (Last 24 hours) at 03/30/14 1028 Last data filed at 03/30/14 0600  Gross per 24 hour  Intake      0 ml  Output      0 ml  Net      0 ml   General: NAD, sitting up in bed  Lungs: CTAB, no wheezing, negative for crackles Cardiac: RRR, no murmurs GI: soft, active bowel sounds Neuro: CN II-XII grossly intact Ext: 2+ pitting edema b/l  Lab Results: Basic Metabolic Panel:  Recent Labs Lab 03/29/14 1349 03/30/14 0908  NA 138 133*  K 3.0* PENDING  CL 101 99  CO2 32 26  GLUCOSE 181* 255*  BUN 16 21  CREATININE 4.03* 4.91*  CALCIUM 8.6 8.8   Liver Function Tests:  Recent Labs Lab 03/29/14 1349  AST 29  ALT 22  ALKPHOS 115  BILITOT 0.4  PROT 5.9*  ALBUMIN 2.8*    Recent Labs Lab 03/29/14 1349  LIPASE 29   CBC:  Recent Labs Lab 03/29/14 1349 03/30/14 0908  WBC 7.6 8.5  NEUTROABS 6.0  --   HGB 11.3* 11.4*  HCT 36.0 35.7*  MCV 89.6 89.0  PLT 168 195   CBG:  Recent Labs Lab 03/29/14 2312 03/30/14 0410 03/30/14 0745  GLUCAP 221* 234* 214*     Urinalysis:  Recent Labs Lab 03/29/14 1812  COLORURINE YELLOW  LABSPEC 1.019  PHURINE 7.0  GLUCOSEU 500*  HGBUR TRACE*  BILIRUBINUR NEGATIVE  KETONESUR 15*  PROTEINUR >300*  UROBILINOGEN 0.2  NITRITE NEGATIVE  LEUKOCYTESUR NEGATIVE     Studies/Results: Dg Abd Acute W/chest  03/29/2014   CLINICAL DATA:  54 year old female complaining of nausea  and vomiting and intermittent chest pain for several days. Cough.  EXAM: ACUTE ABDOMEN SERIES (ABDOMEN 2 VIEW & CHEST 1 VIEW)  COMPARISON:  Chest x-ray 12/14/2013.  FINDINGS: Lung volumes are normal. No consolidative airspace disease. No pleural effusions. Linear bibasilar opacities favored to reflect subsegmental atelectasis. There is cephalization of the pulmonary vasculature and slight indistinctness of the interstitial markings suggestive of mild pulmonary edema. Enlargement of the cardiopericardial silhouette, which could be related to cardiomegaly, however, the heart has somewhat of a "water bottle" appearance, which could indicate underlying pericardial effusion. Upper mediastinal contours are within normal limits. Right internal jugular PermCath with tip terminating in the distal superior vena cava.  Gas and stool are noted throughout the colon extending to the distal rectum. No pathologic dilatation of small bowel. Several nondilated gas-filled loops of small bowel are noted in the central abdomen, which is nonspecific. No air-fluid levels. No pneumoperitoneum. Hepatomegaly (craniocaudal span of the liver is approximately 27 cm).  IMPRESSION: 1. Nonobstructive bowel gas pattern. 2. No pneumoperitoneum. 3. The appearance the chest suggests mild congestive heart failure, as above. 4.  The appearance of the heart may simply reflect underlying cardiomegaly, however, there are imaging findings which can be seen in the setting of a pericardial effusion. Correlation with echocardiography is suggested. 5. Hepatomegaly.   Electronically Signed   By: Trudie Reedaniel  Entrikin M.D.   On: 03/29/2014 17:50   Medications: I have reviewed the patient's current medications. Scheduled Meds: . aspirin EC  81 mg Oral Daily  . calcium carbonate  1 tablet Oral TID WC  . carvedilol  25 mg Oral BID WC  . FLUoxetine  40 mg Oral Daily  . furosemide  80 mg Oral BID  . gabapentin  200 mg Oral QHS  . heparin  5,000 Units Subcutaneous  3 times per day  . hydrALAZINE  100 mg Oral TID  . insulin aspart  0-9 Units Subcutaneous 6 times per day  . ipratropium-albuterol  3 mL Nebulization Q4H  . lisinopril  5 mg Oral Daily  . pantoprazole  40 mg Oral Daily  . pravastatin  40 mg Oral Daily  . 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 Lung exam CTAB - continue duoneb 3 mL neb q4h -continue lasix 80 mg bid po - HD today -daily weights, I&Os (not charted overnight) - CXR concerning for pleural effusion, ECHO ordered  #ESRD on HD: T/Th/Sa schedule - Dr. Arlean HoppingSchertz w/ nephrology taking pt to HD today  #Combined CHF:  She is seen by [facility] Revonda [facility]Bharathi [facility] who last had a TEE on 03/27/14 notable for EF 45-50% but limited study. He feels she has multivessel disease and needs ischemic work-up with cardiac catheterization scheduled for 04/01/14. At that time he increased her hydralazine to 100 mg tid and started the imdur 30 mg daily that she is non-compliant with. He felt she was volume expanded and needed fluid removal with HD at that time. She is on lasix 80 mg bid, coreg 25 mg bid, ASA 81 mg daily, lisinopril 5 mg daily -cont lasix 80 mg bid, coreg 25 mg bid, ASA 81 mg daily, lisinopril 5 mg daily, hydralazine 100mg  TID -HD tomorrow -daily weights, I&Os  #HTN: BP elevated ranging from 167-204 SBP since admission, HD today which will help w/ BPs likely due to fluid overload -cont ASA 81 mg daily, carvedilol 25 mg bid, lasix 80 mg bid, hydralazine 100 mg tid, lisinopril 5 mg daily -hold isosorbide mononitrate 30 mg  daily  #Hypokalemia: Presenting K of 3.0. This may be related to her chronic diarrhea. She had HD yesterday and will have HD tomorrow. -KDur 40 mEq po once, potassium level pending this morning, HD today  #Diarrhea: This sounds like a chronic and stable issue but history unclear. She says she was on an antibiotic about a month ago for a "blood infection" but unsure which drug. Abdominal exam benign with normal liver function panel and lipase. AXR notes nonobstructive gas pattern and hepatomegaly. -c diff needs to be collected - refer pt for outpatient colonoscopy for possible microscopic colitis  #DM1 w/ peripheral neuropathy:  -SSI-sensitive -cont gabapentin 200 mg tid -hemoglobin A1c pending  #Anemia of chronic disease-- likely related to her ESRD. Anemia panel 12/13/13  revealed low iron and low TIBC. Hgb stable at 11.4 and improved from prior admissions. -cont to monitor  #HLD:  -cont home pravastatin 40 mg daily  #Diet: renal/carb modified w fluid restriction  #DVT PPx: heparin 5000 u [place] tid  #Code: full  Dispo: Disposition is deferred at this time, awaiting improvement of current medical problems. Anticipated discharge in approximately 1 day(s).   The patient does have a current PCP (Quentin Angst, MD) and does need an [facility] hospital follow-up appointment after discharge.  The patient does not know have transportation limitations that hinder transportation to clinic appointments.    LOS: 1 day   Gara Kroner, MD 03/30/2014, 10:28 AM

## 2014-03-30 NOTE — Consult Note (Signed)
Renal Service Consult Note [facility]  Joan Young 03/30/2014 [place] Requesting Physician:  Dr Dareen Piano  Reason for Consult:  ESRD pt from [facility] HD with diarrhea and abd pain HPI: The patient is a 54 y.o. year-old with hx of type I DM, HTN and ESRD started HD 2 mos ago and presents with abd pain and diarrhea for about 1 week.  +cough and wheezing, no fever, no prod sputum, +SOB and orthopnea. No CP. Undergoing cardiac w/u thru her [facility] doctors, had echo this week and is for heart cath next week. Last HD was Thursday. Using one needle in AVF and using cath also mostly.  Staff says she came in below dry wt last Rx.   Past Medical History  Past Medical History  Diagnosis Date  . Obesity   . Macular degeneration disease   . Depression   . High cholesterol Dx 2005  . Hypertension Dx 9381  . Diabetes mellitus without complication DX 8299    type 49 since 54 years old but reports starting insulin at 54 years old  . Renal disorder   . Dialysis patient    Past Surgical History  Past Surgical History  Procedure Laterality Date  . Av fistula placement Left 12/19/2013    Procedure: Creation of Left Arm BRACHIOCEPHALIC ARTERIOVENOUS FISTULA ;  Surgeon: Conrad Casselton, MD;  Location: [facility] OR;  Service: Vascular;  Laterality: Left;   Family History  Family History  Problem Relation Age of Onset  . Hypertension Father   . Diabetes Father   . Kidney failure Father 2  . Kidney failure Sister 65  . Kidney failure Brother 7  . Kidney failure Sister 26   Social History  reports that she has never smoked. She does not have any smokeless tobacco history on file. She reports that she does not drink alcohol or use illicit drugs. Allergies  Allergies  Allergen Reactions  . Hydromorphone Itching  . Latex Rash  . Tape Rash   Home medications Prior to Admission medications   Medication Sig Start Date End Date Taking? Authorizing Provider  acetaminophen  (TYLENOL) 325 MG tablet Take 650 mg by mouth every 6 (six) hours as needed (pain).   Yes Historical Provider, MD  aspirin EC 81 MG tablet Take 81 mg by mouth daily.   Yes Historical Provider, MD  calcium carbonate (TUMS) 500 MG chewable tablet Chew 1 tablet (200 mg of elemental calcium total) by mouth 3 (three) times daily with meals. 12/20/13  Yes Charlott Rakes, MD  carvedilol (COREG) 25 MG tablet Take 1 tablet (25 mg total) by mouth 2 (two) times daily with a meal. 12/31/13  Yes Lance Bosch, NP  FLUoxetine (PROZAC) 20 MG capsule Take 1 capsule (20 mg total) by mouth daily. Patient taking differently: Take 40 mg by mouth daily.  12/20/13  Yes Charlott Rakes, MD  furosemide (LASIX) 80 MG tablet TAKE 1 TABLET BY MOUTH 2 TIMES DAILY 03/08/14  Yes Lance Bosch, NP  gabapentin (NEURONTIN) 100 MG capsule Take 2 capsules (200 mg total) by mouth 3 (three) times daily. Patient taking differently: Take 200 mg by mouth at bedtime.  12/20/13  Yes Charlott Rakes, MD  hydrALAZINE (APRESOLINE) 100 MG tablet Take 100 mg by mouth 3 (three) times daily.   Yes Historical Provider, MD  insulin NPH Human (HUMULIN N,NOVOLIN N) 100 UNIT/ML injection Inject 0.08-0.24 mLs (8-24 Units total) into the skin 2 (two) times daily. 8 units in the morning and  24 units at after supper Patient taking differently: Inject 8-10 Units into the skin 2 (two) times daily. Inject 10 units every morning and 8 units every night 12/31/13  Yes Lance Bosch, NP  insulin regular (NOVOLIN R,HUMULIN R) 100 units/mL injection Inject 0.1-0.16 mLs (10-16 Units total) into the skin 3 (three) times daily before meals. 16 units every morning 16 units at lunch 10 units at bedtime.  Per sliding scale Patient taking differently: Inject 0-12 Units into the skin 3 (three) times daily before meals. Per sliding scale 12/31/13  Yes Lance Bosch, NP  isosorbide mononitrate (IMDUR) 30 MG 24 hr tablet Take 30 mg by mouth daily.  03/15/14  Yes Historical Provider, MD   lisinopril (PRINIVIL,ZESTRIL) 5 MG tablet Take 5 mg by mouth daily.  01/05/14  Yes Historical Provider, MD  omeprazole (PRILOSEC) 20 MG capsule Take 20 mg by mouth daily.  01/05/14  Yes Historical Provider, MD  pravastatin (PRAVACHOL) 40 MG tablet Take 40 mg by mouth daily.   Yes Historical Provider, MD  amoxicillin (AMOXIL) 250 MG capsule Take 1 capsule (250 mg total) by mouth daily. Patient not taking: Reported on 03/29/2014 02/11/14   Lance Bosch, NP  glucose blood (FREESTYLE INSULINX TEST) test strip Use as instructed 01/18/14   Tresa Garter, MD  glucose blood test strip Use as instructed 3 times daily 01/17/14   Tresa Garter, MD  glucose monitoring kit (FREESTYLE) monitoring kit 1 each by Does not apply route as needed for other. E11.9  Test up to 4 times daily 01/18/14   Tresa Garter, MD  hydrALAZINE (APRESOLINE) 50 MG tablet Take 1 tablet (50 mg total) by mouth 3 (three) times daily. Patient not taking: Reported on 03/29/2014 02/04/14   Lance Bosch, NP  Lancets (FREESTYLE) lancets Use as instructed 01/18/14   Tresa Garter, MD  lovastatin (MEVACOR) 20 MG tablet Take 1 tablet (20 mg total) by mouth at bedtime. Patient not taking: Reported on 03/29/2014 12/20/13   Charlott Rakes, MD  ranitidine (ZANTAC) 150 MG tablet Take 1 tablet (150 mg total) by mouth 2 (two) times daily. Patient not taking: Reported on 03/29/2014 12/20/13   Charlott Rakes, MD   Liver Function Tests  Recent Labs Lab 03/29/14 1349  AST 29  ALT 22  ALKPHOS 115  BILITOT 0.4  PROT 5.9*  ALBUMIN 2.8*    Recent Labs Lab 03/29/14 1349  LIPASE 29   CBC  Recent Labs Lab 03/29/14 1349 03/30/14 0908  WBC 7.6 8.5  NEUTROABS 6.0  --   HGB 11.3* 11.4*  HCT 36.0 35.7*  MCV 89.6 89.0  PLT 168 381   Basic Metabolic Panel  Recent Labs Lab 03/29/14 1349 03/30/14 0908  NA 138 133*  K 3.0* PENDING  CL 101 99  CO2 32 26  GLUCOSE 181* 255*  BUN 16 21  CREATININE 4.03* 4.91*   CALCIUM 8.6 8.8    Filed Vitals:   03/29/14 2306 03/30/14 0015 03/30/14 0520 03/30/14 0930  BP: 176/75  184/81 190/78  Pulse: 86  83 89  Temp: 98.2 F (36.8 C)  97.6 F (36.4 C) 98.6 F (37 C)  TempSrc: Oral  Oral Oral  Resp: $Remo'18  16 17  'tOjCf$ Height: 5' (1.524 m)     Weight: 86.864 kg (191 lb 8 oz)     SpO2: 95% 97% 99% 89%   Exam Alert, no distress No rash, cyanosis or gangrene Sclera anicteric, throat clear No jvd Chest is  mostly clear, scattered rales at bases RRR no MRG Abd soft, NTND, no mass or ascites RUA AVF w ecchymosis, +bruit 2+ pretib edema bilat Neuro is pleasant, nf, Ox 3  HD: TTS [place] [facility] 3.5h   194lb   F200   300/700   Heparin 3600 (holding new while using AVF)  RUA AVF / R IJ cath (using both now).  Came in under dry wt 3lbs last Rx.   ARanesp 35/wk, Hect 2 ug TIW  Assessment: 1. SOB / pulm edema / vol excess / LE edema - needs lower dry wt and have instructed her in the importance of fluid restriction now that she is on HD.  HD today 2. ESRD on HD , started 2 mos ago 3. Type I DM on insulin 4. Abd pain / diarrhea - better here on oxygen for some reason 5. Anemia on aranesp   Plan- HD today upstairs, max UF, further HD as needed  Kelly Splinter MD (pgr) [phone]    ([phone] 03/30/2014, 10:12 AM

## 2014-03-30 NOTE — Progress Notes (Signed)
Pt HD tx complete. Pt experienced cramping. 2.3 L removed. MD present. 6E staff took pt back to room.

## 2014-03-30 NOTE — Progress Notes (Signed)
UR completed 

## 2014-03-30 NOTE — Progress Notes (Signed)
Pericardial effusion noted on ECHO this afternoon w/ no evidence of hemodynamic compromise. Also noted on ECHO in October 2015. Pt has SOB likely from fluid overload, which is also likely reason for pericardial effusion noted on ECHO today. EKG reassuring that this is not pericarditis. Does not meet requirement for pericardial drainage at this time. Will repeat EKG tomorrow. Continue to monitor. HD today. Will increase lisinopril from 5mg  to 10mg  if BP remain elevated. Unlikely pt in cardiac tamponade w/ elevated BPs, patients usually tachy and hypotensive in tamponade.   Gara Kronerruong, Anniece Bleiler, MD PGY1

## 2014-03-30 NOTE — Procedures (Signed)
I was present at this dialysis session, have reviewed the session itself and made  appropriate changes  Rob Decarlos Empey MD (pgr) [phone]    (c) [phone] 03/30/2014, 7:34 PM    

## 2014-03-31 ENCOUNTER — Observation Stay (HOSPITAL_COMMUNITY): Payer: BLUE CROSS/BLUE SHIELD

## 2014-03-31 DIAGNOSIS — E119 Type 2 diabetes mellitus without complications: Secondary | ICD-10-CM

## 2014-03-31 DIAGNOSIS — I5040 Unspecified combined systolic (congestive) and diastolic (congestive) heart failure: Secondary | ICD-10-CM

## 2014-03-31 DIAGNOSIS — N186 End stage renal disease: Secondary | ICD-10-CM | POA: Diagnosis not present

## 2014-03-31 DIAGNOSIS — I120 Hypertensive chronic kidney disease with stage 5 chronic kidney disease or end stage renal disease: Secondary | ICD-10-CM | POA: Diagnosis not present

## 2014-03-31 DIAGNOSIS — R0600 Dyspnea, unspecified: Secondary | ICD-10-CM

## 2014-03-31 DIAGNOSIS — R197 Diarrhea, unspecified: Secondary | ICD-10-CM | POA: Diagnosis not present

## 2014-03-31 DIAGNOSIS — Z992 Dependence on renal dialysis: Secondary | ICD-10-CM | POA: Diagnosis not present

## 2014-03-31 LAB — CBC WITH DIFFERENTIAL/PLATELET
Basophils Absolute: 0 10*3/uL (ref 0.0–0.1)
Basophils Relative: 0 % (ref 0–1)
Eosinophils Absolute: 0.1 10*3/uL (ref 0.0–0.7)
Eosinophils Relative: 1 % (ref 0–5)
HEMATOCRIT: 34.6 % — AB (ref 36.0–46.0)
HEMOGLOBIN: 10.9 g/dL — AB (ref 12.0–15.0)
LYMPHS ABS: 0.8 10*3/uL (ref 0.7–4.0)
LYMPHS PCT: 13 % (ref 12–46)
MCH: 28.4 pg (ref 26.0–34.0)
MCHC: 31.5 g/dL (ref 30.0–36.0)
MCV: 90.1 fL (ref 78.0–100.0)
Monocytes Absolute: 0.5 10*3/uL (ref 0.1–1.0)
Monocytes Relative: 8 % (ref 3–12)
NEUTROS ABS: 5.1 10*3/uL (ref 1.7–7.7)
NEUTROS PCT: 78 % — AB (ref 43–77)
Platelets: 169 10*3/uL (ref 150–400)
RBC: 3.84 MIL/uL — AB (ref 3.87–5.11)
RDW: 16.5 % — ABNORMAL HIGH (ref 11.5–15.5)
WBC: 6.5 10*3/uL (ref 4.0–10.5)

## 2014-03-31 LAB — GLUCOSE, CAPILLARY
GLUCOSE-CAPILLARY: 70 mg/dL (ref 70–99)
GLUCOSE-CAPILLARY: 257 mg/dL — AB (ref 70–99)
GLUCOSE-CAPILLARY: 241 mg/dL — AB (ref 70–99)
Glucose-Capillary: 121 mg/dL — ABNORMAL HIGH (ref 70–99)
Glucose-Capillary: 162 mg/dL — ABNORMAL HIGH (ref 70–99)
Glucose-Capillary: 172 mg/dL — ABNORMAL HIGH (ref 70–99)
Glucose-Capillary: 261 mg/dL — ABNORMAL HIGH (ref 70–99)
Glucose-Capillary: 82 mg/dL (ref 70–99)

## 2014-03-31 LAB — BASIC METABOLIC PANEL
Anion gap: 4 — ABNORMAL LOW (ref 5–15)
BUN: 13 mg/dL (ref 6–23)
CHLORIDE: 99 mmol/L (ref 96–112)
CO2: 32 mmol/L (ref 19–32)
Calcium: 8.2 mg/dL — ABNORMAL LOW (ref 8.4–10.5)
Creatinine, Ser: 3.17 mg/dL — ABNORMAL HIGH (ref 0.50–1.10)
GFR calc Af Amer: 18 mL/min — ABNORMAL LOW (ref >90)
GFR calc non Af Amer: 16 mL/min — ABNORMAL LOW (ref >90)
Glucose, Bld: 258 mg/dL — ABNORMAL HIGH (ref 70–99)
Potassium: 3.5 mmol/L (ref 3.5–5.1)
SODIUM: 135 mmol/L (ref 135–145)

## 2014-03-31 LAB — HEPATITIS B SURFACE ANTIGEN: HEP B S AG: NEGATIVE

## 2014-03-31 LAB — BRAIN NATRIURETIC PEPTIDE: B NATRIURETIC PEPTIDE 5: 2127.3 pg/mL — AB (ref 0.0–100.0)

## 2014-03-31 MED ORDER — TRAZODONE HCL 50 MG PO TABS
50.0000 mg | ORAL_TABLET | Freq: Once | ORAL | Status: AC
  Administered 2014-03-31: 50 mg via ORAL
  Filled 2014-03-31: qty 1

## 2014-03-31 MED ORDER — LEVOFLOXACIN 750 MG PO TABS
750.0000 mg | ORAL_TABLET | Freq: Once | ORAL | Status: AC
Start: 2014-03-31 — End: 2014-03-31
  Administered 2014-03-31: 750 mg via ORAL
  Filled 2014-03-31: qty 1

## 2014-03-31 MED ORDER — LISINOPRIL 10 MG PO TABS
10.0000 mg | ORAL_TABLET | Freq: Every day | ORAL | Status: DC
  Administered 2014-04-01: 10 mg via ORAL
  Filled 2014-03-31: qty 1

## 2014-03-31 MED ORDER — INSULIN ASPART 100 UNIT/ML ~~LOC~~ SOLN
0.0000 [IU] | Freq: Three times a day (TID) | SUBCUTANEOUS | Status: DC
  Administered 2014-04-01: 2 [IU] via SUBCUTANEOUS

## 2014-03-31 MED ORDER — LEVOFLOXACIN 500 MG PO TABS: 500.0000 mg | ORAL_TABLET | ORAL | Status: DC

## 2014-03-31 MED ORDER — LISINOPRIL 5 MG PO TABS
5.0000 mg | ORAL_TABLET | Freq: Once | ORAL | Status: AC
  Administered 2014-03-31: 5 mg via ORAL
  Filled 2014-03-31: qty 1

## 2014-03-31 MED ORDER — ISOSORBIDE MONONITRATE ER 30 MG PO TB24
30.0000 mg | ORAL_TABLET | Freq: Every day | ORAL | Status: DC
  Administered 2014-03-31 – 2014-04-01 (×2): 30 mg via ORAL
  Filled 2014-03-31 (×2): qty 1

## 2014-03-31 NOTE — Progress Notes (Signed)
SATURATION QUALIFICATIONS: (This note is used to comply with regulatory documentation for home oxygen)  Patient Saturations on Room Air at Rest = 92%  Patient Saturations on Room Air while Ambulating = 86%  Patient Saturations on 2L Liters of oxygen while Ambulating = 96%  Please briefly explain why patient needs home oxygen: pt desats

## 2014-03-31 NOTE — Progress Notes (Signed)
ANTIBIOTIC CONSULT NOTE - INITIAL  Pharmacy Consult for Levaquin Indication: CAP  Allergies  Allergen Reactions  . Hydromorphone Itching  . Latex Rash  . Tape Rash    Patient Measurements: Height: 5' (152.4 cm) Weight: 195 lb 5.2 oz (88.6 kg) IBW/kg (Calculated) : 45.5  Vital Signs: Temp: 98.1 F (36.7 C) (01/31 0501) Temp Source: Oral (01/31 0501) BP: 185/70 mmHg (01/31 0501) Pulse Rate: 76 (01/31 0501) Intake/Output from previous day: 01/30 0701 - 01/31 0700 In: 480 [P.O.:480] Out: 2356  Intake/Output from this shift:    Labs:  Recent Labs  03/29/14 1349 03/30/14 0908 03/31/14 0410  WBC 7.6 8.5 6.5  HGB 11.3* 11.4* 10.9*  PLT 168 195 169  CREATININE 4.03* 4.91* 3.17*   Estimated Creatinine Clearance: 20.5 mL/min (by C-G formula based on Cr of 3.17). No results for input(s): VANCOTROUGH, VANCOPEAK, VANCORANDOM, GENTTROUGH, GENTPEAK, GENTRANDOM, TOBRATROUGH, TOBRAPEAK, TOBRARND, AMIKACINPEAK, AMIKACINTROU, AMIKACIN in the last 72 hours.   Microbiology: Recent Results (from the past 720 hour(s))  MRSA PCR Screening     Status: None   Collection Time: 03/29/14 11:21 PM  Result Value Ref Range Status   MRSA by PCR NEGATIVE NEGATIVE Final    Comment:        The GeneXpert MRSA Assay (FDA approved for NASAL specimens only), is one component of a comprehensive MRSA colonization surveillance program. It is not intended to diagnose MRSA infection nor to guide or monitor treatment for MRSA infections.   Clostridium Difficile by PCR     Status: None   Collection Time: 03/30/14 12:30 PM  Result Value Ref Range Status   C difficile by pcr NEGATIVE NEGATIVE Final    Medical History: Past Medical History  Diagnosis Date  . Obesity   . Macular degeneration disease   . Depression   . High cholesterol Dx 2005  . Hypertension Dx [id number]  . Diabetes mellitus without complication DX 1969    type 652 since 54 years old but reports starting insulin at [age] years old  .  Renal disorder   . Dialysis patient     Medications:  Scheduled:  . aspirin EC  81 mg Oral Daily  . calcium carbonate  1 tablet Oral TID WC  . carvedilol  25 mg Oral BID WC  . FLUoxetine  40 mg Oral Daily  . furosemide  80 mg Oral BID  . gabapentin  200 mg Oral QHS  . heparin  5,000 Units Subcutaneous 3 times per day  . hydrALAZINE  100 mg Oral TID  . insulin aspart  0-15 Units Subcutaneous 6 times per day  . ipratropium-albuterol  3 mL Nebulization TID  . [START ON 04/01/2014] lisinopril  10 mg Oral Daily  . pantoprazole  40 mg Oral Daily  . pravastatin  40 mg Oral Daily  . sodium chloride  3 mL Intravenous Q12H   Infusions:    Assessment:  CC: Joan Young is an 54 y.o. female admitted on 03/29/2014 presenting with abd pain x 3 months.  CXR revealed new anterior left upper lobe and lingular airspace disease suggesting pneumonia, with small left effusion.  Pharmacy has been consulted to dose levofloxacin for CAP.    PMH: type I DM, HTN and ESRD started HD 2 mos ago   Infectious Disease: CAP - WBC wnl, afeb.   1/31 Levo >>  1/30 C.diff >> neg MRSA PCR neg.   Nephrology: ESRD HD (TTS) - K low - supped.  Ca wnl.  Tums 1 tab  PO TID.  K wnl.  No HD today.    Plan: - Levofloxacin 750 mg PO x 1 - Levofloxacin 500 mg PO Q48H - Monitor for clinical efficacy - Monitor renal function - Follow up cultures  Joan Young, Pharm. D. Clinical Pharmacy Resident Pager: [phone] Ph: [phone] 03/31/2014 11:06 AM

## 2014-03-31 NOTE — Care Management Note (Unsigned)
    Page 1 of 1   03/31/2014     1:46:59 PM CARE MANAGEMENT NOTE 03/31/2014  Patient:  Joan Young,Joan Young   Account Number:  [id number]  Date Initiated:  03/31/2014  Documentation initiated by:  [facility],Stalin Gruenberg  Subjective/Objective Assessment:   adm: SOB and DOE     Action/Plan:   discharge planning   Anticipated DC Date:  03/31/2014   Anticipated DC Plan:  HOME/SELF CARE      DC Planning Services  CM consult      Choice offered to / List presented to:     DME arranged  [facility]      DME agency  [facility]        Status of service:  In process, will continue to follow Medicare Important Message given?   (If response is "NO", the following Medicare IM given date fields will be blank) Date Medicare IM given:   Medicare IM given by:   Date Additional Medicare IM given:   Additional Medicare IM given by:    Discharge Disposition:  HOME/SELF CARE  Per UR Regulation:    If discussed at Long Length of Stay Meetings, dates discussed:    Comments:  03/31/14 13:50 [name].  CM called RN who confirmed order cancelled.  CM called [facility] DME Fayrene FearingJames to notify of cancellation. Will continue to follow for dispostion. Freddy JakschSarah Kiasha Bellin, BSN, CM [phone].  11:50 CM received call from RN stating pt needs home O2. CM called [facility] DME rep, James to plase deliver home oxygen prior to discharge.  Freddy JakschSarah Nataliya Graig, BSN, CM [phone].

## 2014-03-31 NOTE — Progress Notes (Signed)
UR completed 

## 2014-03-31 NOTE — Progress Notes (Signed)
Subjective:  Pt seen and examined in AM. No acute events overnight. She tolerated HD well yesterday (mild muscle cramping). She denies dyspnea, cough, fever/chills, chest pain, LE swelling, or lightheadedness.  She would like to go home today because she has heart catherization scheduled for tomorrow.       Objective: Vital signs in last 24 hours: Filed Vitals:   03/30/14 2115 03/30/14 2145 03/30/14 2225 03/31/14 0501  BP: 117/42 162/77 165/70 185/70  Pulse: 80 80 84 76  Temp:  98.5 F (36.9 C) 98.5 F (36.9 C) 98.1 F (36.7 C)  TempSrc:  Oral Oral Oral  Resp: Height:      Weight:      SpO2: 100% 100% 100% 100%   Weight change: 3 lb 13.2 oz (1.736 kg)  Intake/Output Summary (Last 24 hours) at 03/31/14 1027 Last data filed at 03/31/14 0700  Gross per 24 hour  Intake    480 ml  Output   2356 ml  Net  -1876 ml   PHYSICAL EXAMINATION: General: alert, well-developed, and cooperative to examination.  Lungs: normal respiratory effort, no accessory muscle use, normal breath sounds, no crackles, and no wheezes. Heart: normal rate, regular rhythm Abdomen: soft, non-tender, normal bowel sounds, no distention, no guarding, no rebound tenderness Extremities: +1 b/l LE swelling  Neurologic: alert & oriented X3  Lab Results: Basic Metabolic Panel:  Recent Labs Lab 03/30/14 0908 03/31/14 0410  NA 133* 135  K 3.9 3.5  CL 99 99  CO2 26 32  GLUCOSE 255* 258*  BUN 21 13  CREATININE 4.91* 3.17*  CALCIUM 8.8 8.2*   Liver Function Tests:  Recent Labs Lab 03/29/14 1349  AST 29  ALT 22  ALKPHOS 115  BILITOT 0.4  PROT 5.9*  ALBUMIN 2.8*    Recent Labs Lab 03/29/14 1349  LIPASE 29   CBC:  Recent Labs Lab 03/29/14 1349 03/30/14 0908 03/31/14 0410  WBC 7.6 8.5 6.5  NEUTROABS 6.0  --  5.1  HGB 11.3* 11.4* 10.9*  HCT 36.0 35.7* 34.6*  MCV 89.6 89.0 90.1  PLT 168 195 169   CBG:  Recent Labs Lab 03/30/14 1209 03/30/14 1717 03/30/14 2215  03/30/14 2301 03/31/14 0007 03/31/14 0457  GLUCAP 342* 426* 70 82 121* 261*   Urinalysis:  Recent Labs Lab 03/29/14 1812  COLORURINE YELLOW  LABSPEC 1.019  PHURINE 7.0  GLUCOSEU 500*  HGBUR TRACE*  BILIRUBINUR NEGATIVE  KETONESUR 15*  PROTEINUR >300*  UROBILINOGEN 0.2  NITRITE NEGATIVE  LEUKOCYTESUR NEGATIVE     Micro Results: Recent Results (from the past 240 hour(s))  MRSA PCR Screening     Status: None   Collection Time: 03/29/14 11:21 PM  Result Value Ref Range Status   MRSA by PCR NEGATIVE NEGATIVE Final    Comment:        The GeneXpert MRSA Assay (FDA approved for NASAL specimens only), is one component of a comprehensive MRSA colonization surveillance program. It is not intended to diagnose MRSA infection nor to guide or monitor treatment for MRSA infections.   Clostridium Difficile by PCR     Status: None   Collection Time: 03/30/14 12:30 PM  Result Value Ref Range Status   C difficile by pcr NEGATIVE NEGATIVE Final   Studies/Results: Dg Chest 2 View  03/31/2014   CLINICAL DATA:  Dyspnea, hx:htn, DM, CKD-dialysis pt  EXAM: CHEST - 2 VIEW  COMPARISON:  03/29/2014  FINDINGS: There is a new focal region of  air airspace consolidation in the anterior left upper lobe and lingula. Patchy poorly marginated airspace opacities in both lung bases. Mild cardiomegaly stable. Small left pleural effusion is now noted. Right IJ tunneled hemodialysis catheter stable. No pneumothorax.  Atheromatous aorta.  Regional bones unremarkable.  IMPRESSION: 1. New anterior left upper lobe and lingular airspace disease suggesting pneumonia, with small left effusion.   Electronically Signed   By: Oley Balmaniel  Hassell M.D.   On: 03/31/2014 08:51   Dg Abd Acute W/chest  03/29/2014   CLINICAL DATA:  54 year old female complaining of nausea and vomiting and intermittent chest pain for several days. Cough.  EXAM: ACUTE ABDOMEN SERIES (ABDOMEN 2 VIEW & CHEST 1 VIEW)  COMPARISON:  Chest x-ray  12/14/2013.  FINDINGS: Lung volumes are normal. No consolidative airspace disease. No pleural effusions. Linear bibasilar opacities favored to reflect subsegmental atelectasis. There is cephalization of the pulmonary vasculature and slight indistinctness of the interstitial markings suggestive of mild pulmonary edema. Enlargement of the cardiopericardial silhouette, which could be related to cardiomegaly, however, the heart has somewhat of a "water bottle" appearance, which could indicate underlying pericardial effusion. Upper mediastinal contours are within normal limits. Right internal jugular PermCath with tip terminating in the distal superior vena cava.  Gas and stool are noted throughout the colon extending to the distal rectum. No pathologic dilatation of small bowel. Several nondilated gas-filled loops of small bowel are noted in the central abdomen, which is nonspecific. No air-fluid levels. No pneumoperitoneum. Hepatomegaly (craniocaudal span of the liver is approximately 27 cm).  IMPRESSION: 1. Nonobstructive bowel gas pattern. 2. No pneumoperitoneum. 3. The appearance the chest suggests mild congestive heart failure, as above. 4. The appearance of the heart may simply reflect underlying cardiomegaly, however, there are imaging findings which can be seen in the setting of a pericardial effusion. Correlation with echocardiography is suggested. 5. Hepatomegaly.   Electronically Signed   By: Trudie Reedaniel  Entrikin M.D.   On: 03/29/2014 17:50   Medications: I have reviewed the patient's current medications. Scheduled Meds: . aspirin EC  81 mg Oral Daily  . calcium carbonate  1 tablet Oral TID WC  . carvedilol  25 mg Oral BID WC  . FLUoxetine  40 mg Oral Daily  . furosemide  80 mg Oral BID  . gabapentin  200 mg Oral QHS  . heparin  5,000 Units Subcutaneous 3 times per day  . hydrALAZINE  100 mg Oral TID  . insulin aspart  0-15 Units Subcutaneous 6 times per day  . ipratropium-albuterol  3 mL  Nebulization TID  . [START ON 04/01/2014] lisinopril  10 mg Oral Daily  . pantoprazole  40 mg Oral Daily  . pravastatin  40 mg Oral Daily  . sodium chloride  3 mL Intravenous Q12H   Continuous Infusions:  PRN Meds:.acetaminophen, benzonatate, ipratropium-albuterol, ondansetron **OR** ondansetron (ZOFRAN) IV Assessment/Plan: Principal Problem:   Hypoxia Active Problems:   Type 1 diabetes mellitus with renal manifestations, uncontrolled   Hyperlipidemia   Essential hypertension   ESRD on hemodialysis   Anemia in chronic kidney disease  Dyspnea - Resolved. Most likely multifactorial due to volume overload from ESRD and chronic combined CHF. CXR this AM revealed possible left upper lobe and lingular airspace disease suggestive of PNA with small left pleural effusion.  -Ambulate and record oxygen saturation -Levaquin per pharmacy for 7 days for CAP  -Pt to have next HD on Tuesday (per Dr. Arlean HoppingSchertz she is stable for discharge today) -F/u respiratory panel  -Continue tessalon  100 mg TID PRN cough -Outpatient cardiac catherization scheduled for Monday   Uncontrolled Hypertension - BP currently 185/70  -Increase lisinopril from 5 mg to 10 mg daily  -Restart imdur 30 mg daily  -Pt still on PO lasix 80 mg BID , per Dr. Arlean Hopping this can be stopped as outpatient by her nephrologist -Continue carvedilol 25 mg BID and hydralazine 100 mg TID -Consider starting spironolactone as outpatient if continues to be uncontrolled   Type 1 DM - Last CBG 261. Last A1c 8.3 in 11/2013. Pt at home on  NPH 8 u am and 24 u pm, novolin 16 u breakfast, 16 u lunch, 10 u dinner. -F/u A1c  -Continue sensitive SSI -Continue gabapentin 200 mg TID -Continue pravastatin 40 mg daily  -Continue aspirin 81 mg daily   Chronic diarrhea - Currently asymptomatic -Pt needs outpatient colonoscopy for further evaluation   Diet: Renal with fluid restriction DVT PPx: SQ heparin TID  Dispo: Disposition is deferred at this time,  awaiting improvement of current medical problems.  Anticipated discharge is today.   The patient does have a current PCP (Quentin Angst, MD) and does need an [facility] follow-up appointment after discharge.  The patient does not have transportation limitations that hinder transportation to clinic appointments.  .Services Needed at time of discharge: Y = Yes, Blank = No PT:  No  OT:  No  RN:  No  Equipment:  No  Other:     LOS: 2 days   Otis Brace, MD 03/31/2014, 10:27 AM

## 2014-03-31 NOTE — Progress Notes (Signed)
Spoke with resident Dr Johna Rolesabbani, patient hypoxic off of nasal cannula O2.  Really not safe for discharge, needs more fluid off and resolution of pulm edema / vol excess. Wrote HD orders for Monday am with max UF. Doubt PNA but defer to primary team re: abx, would favor po abx if needed to minimize volume.   Vinson Moselleob Sophie Quiles MD (pgr) [phone]    [phone] 03/31/2014, 1:22 PM

## 2014-03-31 NOTE — Progress Notes (Signed)
  [facility] Progress Note   Subjective: breathing better, still a little SOB . Has [facility] and wants to go.  Lying flat.  2.3kg off, had problems w cramping, BP did not drop.    Filed Vitals:   03/30/14 2115 03/30/14 2145 03/30/14 2225 03/31/14 0501  BP: 117/42 162/77 165/70 185/70  Pulse: 80 80 84 76  Temp:  98.5 F (36.9 C) 98.5 F (36.9 C) 98.1 F (36.7 C)  TempSrc:  Oral Oral Oral  Resp: 24 24 20 20   Height:      Weight:      SpO2: 100% 100% 100% 100%   Exam: Alert, no distress No jvd Chest dec'd at bases, no rales RRR no MRG Abd soft, NTND, no mass or ascites RUA AVF w ecchymosis, +bruit 1+ pretib edema bilat Neuro is pleasant, nf, Ox 3  HD: T[facility] [place] [facility] 3.5h 194lb F200 300/700 Heparin was on 3600, getting nothing while sticking AVF. RUA AVF / R IJ cath (using both now). Came in under dry wt 3lbs last Rx.  ARanesp 35/wk, Hect 2 ug TIW  Assessment: 1. SOB / pulm edema / vol excess / LE edema - improved, is down to dry wt but still has fluid excess.  Symptoms better and slept lying flat.  She [facility]. No HD planned for today. Defer to primary regarding dc today. If she is still here tomorrow will do additional HD to get vol down further.  2. ESRD on HD , started 2 mos ago 3. Type I DM on insulin 4. Abd pain / diarrhea - better here on oxygen for some reason 5. Anemia on aranesp  Plan - as above    Vinson Moselleob Michella Detjen MD  pager [phone]    cell [phone]  03/31/2014, 6:52 AM     Recent Labs Lab 03/29/14 1349 03/30/14 0908 03/31/14 0410  NA 138 133* 135  K 3.0* 3.9 3.5  CL 101 99 99  CO2 32 26 32  GLUCOSE 181* 255* 258*  BUN 16 21 13   CREATININE 4.03* 4.91* 3.17*  CALCIUM 8.6 8.8 8.2*    Recent Labs Lab 03/29/14 1349  AST 29  ALT 22  ALKPHOS 115  BILITOT 0.4  PROT 5.9*  ALBUMIN 2.8*    Recent Labs Lab 03/29/14 1349 03/30/14 0908  03/31/14 0410  WBC 7.6 8.5 6.5  NEUTROABS 6.0  --  5.1  HGB 11.3* 11.4* 10.9*  HCT 36.0 35.7* 34.6*  MCV 89.6 89.0 90.1  PLT 168 195 169   . aspirin EC  81 mg Oral Daily  . calcium carbonate  1 tablet Oral TID WC  . carvedilol  25 mg Oral BID WC  . FLUoxetine  40 mg Oral Daily  . furosemide  80 mg Oral BID  . gabapentin  200 mg Oral QHS  . heparin  5,000 Units Subcutaneous 3 times per day  . hydrALAZINE  100 mg Oral TID  . insulin aspart  0-15 Units Subcutaneous 6 times per day  . ipratropium-albuterol  3 mL Nebulization TID  . lisinopril  5 mg Oral Daily  . pantoprazole  40 mg Oral Daily  . pravastatin  40 mg Oral Daily  . sodium chloride  3 mL Intravenous Q12H     acetaminophen, benzonatate, ipratropium-albuterol, ondansetron **OR** ondansetron (ZOFRAN) IV

## 2014-04-01 DIAGNOSIS — R0600 Dyspnea, unspecified: Secondary | ICD-10-CM | POA: Diagnosis not present

## 2014-04-01 DIAGNOSIS — R197 Diarrhea, unspecified: Secondary | ICD-10-CM | POA: Diagnosis not present

## 2014-04-01 DIAGNOSIS — I120 Hypertensive chronic kidney disease with stage 5 chronic kidney disease or end stage renal disease: Secondary | ICD-10-CM | POA: Diagnosis not present

## 2014-04-01 DIAGNOSIS — E119 Type 2 diabetes mellitus without complications: Secondary | ICD-10-CM | POA: Diagnosis not present

## 2014-04-01 LAB — RENAL FUNCTION PANEL
ALBUMIN: 2.3 g/dL — AB (ref 3.5–5.2)
Anion gap: 7 (ref 5–15)
BUN: 25 mg/dL — ABNORMAL HIGH (ref 6–23)
CALCIUM: 8.4 mg/dL (ref 8.4–10.5)
CO2: 29 mmol/L (ref 19–32)
Chloride: 99 mmol/L (ref 96–112)
Creatinine, Ser: 4.44 mg/dL — ABNORMAL HIGH (ref 0.50–1.10)
GFR calc Af Amer: 12 mL/min — ABNORMAL LOW (ref >90)
GFR, EST NON AFRICAN AMERICAN: 10 mL/min — AB (ref >90)
Glucose, Bld: 254 mg/dL — ABNORMAL HIGH (ref 70–99)
PHOSPHORUS: 3.2 mg/dL (ref 2.3–4.6)
Potassium: 3.5 mmol/L (ref 3.5–5.1)
Sodium: 135 mmol/L (ref 135–145)

## 2014-04-01 LAB — HEMOGLOBIN A1C
Hgb A1c MFr Bld: 7.9 % — ABNORMAL HIGH (ref 4.8–5.6)
Mean Plasma Glucose: 180 mg/dL

## 2014-04-01 LAB — CBC
HCT: 31.5 % — ABNORMAL LOW (ref 36.0–46.0)
Hemoglobin: 10 g/dL — ABNORMAL LOW (ref 12.0–15.0)
MCH: 28.7 pg (ref 26.0–34.0)
MCHC: 31.7 g/dL (ref 30.0–36.0)
MCV: 90.5 fL (ref 78.0–100.0)
Platelets: 145 10*3/uL — ABNORMAL LOW (ref 150–400)
RBC: 3.48 MIL/uL — ABNORMAL LOW (ref 3.87–5.11)
RDW: 16.3 % — AB (ref 11.5–15.5)
WBC: 4.8 10*3/uL (ref 4.0–10.5)

## 2014-04-01 LAB — HEPATITIS B SURFACE ANTIBODY,QUALITATIVE: Hep B S Ab: REACTIVE — AB

## 2014-04-01 LAB — GLUCOSE, CAPILLARY: GLUCOSE-CAPILLARY: 134 mg/dL — AB (ref 70–99)

## 2014-04-01 LAB — HEPATITIS B CORE ANTIBODY, TOTAL: HEP B C TOTAL AB: POSITIVE — AB

## 2014-04-01 MED ORDER — LISINOPRIL 10 MG PO TABS: 10.0000 mg | ORAL_TABLET | Freq: Every day | ORAL | Status: DC

## 2014-04-01 MED ORDER — ZOLPIDEM TARTRATE 10 MG PO TABS: 10.0000 mg | ORAL_TABLET | Freq: Every evening | ORAL | Status: DC | PRN

## 2014-04-01 MED ORDER — BENZONATATE 100 MG PO CAPS: 100.0000 mg | ORAL_CAPSULE | Freq: Three times a day (TID) | ORAL | Status: DC | PRN

## 2014-04-01 MED ORDER — ZOLPIDEM TARTRATE 5 MG PO TABS: 10.0000 mg | ORAL_TABLET | Freq: Every evening | ORAL | Status: DC | PRN

## 2014-04-01 MED ORDER — LEVOFLOXACIN 500 MG PO TABS: 500.0000 mg | ORAL_TABLET | ORAL | Status: DC

## 2014-04-01 NOTE — Discharge Summary (Signed)
Name: Joan Young MRN: [id number] DOB: 05-Dec-1960 54 y.o. PCP: Tresa Garter, MD  Date of Admission: 03/29/2014  4:03 PM Date of Discharge: 04/01/2014 Attending Physician: Karren Cobble, MD  Discharge Diagnosis: 1.  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     aspirin EC 81 MG tablet  Take 81 mg by mouth daily.     benzonatate 100 MG capsule  Commonly known as:  TESSALON  Take 1 capsule (100 mg total) by mouth 3 (three) times daily as needed for cough.     calcium carbonate 500 MG chewable tablet  Commonly known as:  TUMS  Chew 1 tablet (200 mg of elemental calcium total) by mouth 3 (three) times daily with meals.     carvedilol 25 MG tablet  Commonly known as:  COREG  Take 1 tablet (25 mg total) by mouth 2 (two) times daily with a meal.     FLUoxetine 20 MG capsule  Commonly known as:  PROZAC  Take 1 capsule (20 mg total) by mouth daily.     freestyle lancets  Use as instructed     gabapentin 100 MG capsule  Commonly known as:  NEURONTIN  Take 2 capsules (200 mg total) by mouth 3 (three) times daily.     glucose blood test strip  Use as instructed 3 times daily     glucose blood test strip  Commonly known as:  FREESTYLE INSULINX TEST  Use as instructed     glucose monitoring kit monitoring kit  1 each by  Does not apply route as needed for other. E11.9  Test up to 4 times daily     hydrALAZINE 100 MG tablet  Commonly known as:  APRESOLINE  Take 100 mg by mouth 3 (three) times daily.     insulin NPH Human 100 UNIT/ML injection  Commonly known as:  HUMULIN N,NOVOLIN N  Inject 0.08-0.24 mLs (8-24 Units total) into the skin 2 (two) times daily. 8 units in the morning and 24 units at after supper     insulin regular 100 units/mL injection  Commonly known as:  NOVOLIN R,HUMULIN R  Inject 0.1-0.16 mLs (10-16 Units total) into the skin 3 (three) times daily before meals. 16 units every morning 16 units at lunch 10 units at bedtime.  Per sliding scale     isosorbide mononitrate 30 MG 24 hr tablet  Commonly known as:  IMDUR  Take 30 mg by mouth daily.     levofloxacin 500 MG tablet  Commonly known as:  LEVAQUIN  Take 1 tablet (500 mg total) by mouth every other day.     lisinopril 10 MG tablet  Commonly known as:  PRINIVIL,ZESTRIL  Take 1 tablet (10 mg total) by mouth daily.     omeprazole 20 MG capsule  Commonly known as:  PRILOSEC  Take 20 mg by mouth daily.     pravastatin 40 MG tablet  Commonly known as:  PRAVACHOL  Take 40 mg by mouth daily.     zolpidem 10 MG tablet  Commonly known as:  AMBIEN  Take 1 tablet (10 mg total) by mouth at bedtime as needed for sleep.        Disposition and follow-up:   Ms.Joan Young was discharged from [facility] in Good condition.  At the hospital follow up visit please address:  1.  LUL Pneumonia- Repeat CXR in 6-8 weeks to ensure PNA treated Oxygen requirements- pt desaturated to 85% on room air while ambulating. Discharged with home oxygen. Assess oxygen requirements.  2.  Labs / imaging needed at time of follow-up: CXR in 6-8 weeks  3.  Pending labs/ test needing follow-up: Resp virus panel  Follow-up Appointments:     Follow-up Information    Follow up with [facility].   Why:  home health  oxygen   Contact information:   [address] [place] [address] [phone]       Discharge Instructions:   Consultations: Treatment Team:  Sol Blazing, MD  Procedures Performed:  Dg Chest 2 View  03/31/2014   CLINICAL DATA:  Dyspnea, hx:htn, DM, CKD-dialysis pt  EXAM: CHEST - 2 VIEW  COMPARISON:  03/29/2014  FINDINGS: There is a new focal region of air airspace consolidation in the anterior left upper lobe and lingula. Patchy poorly marginated airspace opacities in both lung bases. Mild cardiomegaly stable. Small left pleural effusion is now noted. Right IJ tunneled hemodialysis catheter stable. No pneumothorax.  Atheromatous aorta.  Regional bones unremarkable.  IMPRESSION: 1. New anterior left upper lobe and lingular airspace disease suggesting pneumonia, with small left effusion.   Electronically Signed   By: Arne Cleveland M.D.   On: 03/31/2014 08:51   Dg Abd Acute W/chest  03/29/2014   CLINICAL DATA:  54 year old female complaining of nausea and vomiting and intermittent chest pain for several days. Cough.  EXAM: ACUTE ABDOMEN SERIES (ABDOMEN 2 VIEW & CHEST 1 VIEW)  COMPARISON:  Chest x-ray 12/14/2013.  FINDINGS: Lung volumes are normal. No consolidative airspace disease. No pleural effusions. Linear bibasilar opacities favored to reflect subsegmental atelectasis. There is cephalization of the pulmonary vasculature and slight indistinctness of the interstitial markings suggestive of mild pulmonary edema. Enlargement of the cardiopericardial silhouette, which could be related to cardiomegaly, however, the heart has somewhat of a "water bottle" appearance, which could indicate underlying pericardial effusion. Upper mediastinal contours are within normal limits. Right internal jugular PermCath with tip terminating in the distal superior vena cava.  Gas and stool are noted throughout the colon extending to the distal rectum. No pathologic dilatation of small bowel. Several  nondilated gas-filled loops of small bowel are noted in the central abdomen, which is nonspecific. No air-fluid levels. No pneumoperitoneum. Hepatomegaly (craniocaudal span of the liver is approximately 27 cm).  IMPRESSION: 1. Nonobstructive bowel gas pattern. 2. No pneumoperitoneum. 3. The appearance the chest suggests mild congestive heart failure, as above. 4. The appearance of the heart may simply reflect underlying cardiomegaly, however, there are imaging findings which can be seen in the setting of a pericardial effusion. Correlation with echocardiography is suggested. 5. Hepatomegaly.   Electronically Signed   By:  Vinnie Langton M.D.   On: 03/29/2014 17:50    2D Echo:  03/30/14 Study Conclusions - Left ventricle: The cavity size was normal. Wall thickness was increased in a pattern of mild LVH. Systolic function was moderately reduced. The estimated ejection fraction was in the range of 35% to 40%. Features are consistent with a pseudonormal left ventricular filling pattern, with concomitant abnormal relaxation and increased filling pressure (grade 2 diastolic dysfunction). - Mitral valve: There was mild to moderate regurgitation directed eccentrically and posteriorly. - Left atrium: The atrium was mildly dilated. - Pulmonary arteries: Systolic pressure was moderately to severely increased. PA peak pressure: 64 mm Hg (S). - Pericardium, extracardiac: A moderate, free-flowing pericardial effusion was identified circumferential to the heart. There was no evidence of hemodynamic compromise.   Admission HPI: Ms Nicola Girt is a 54 year old Spanish speaking woman with ESRD on T/Th/Sa HD, DM1, CHF, HTN, HL who presents with SOB. History obtained with assistance of daughter. She was in her usual state of health until about 2 weeks ago when she noticed an increase in her baseline SOB as well as chest tightness and wheezing. She also has had a mostly dry cough that is rarely  productive of yellow sputum as well as a sore throat. She notes three pillow orthopnea. She was seen by her cardiologist this last Wednesday 03/27/14 who had a TEE done with EF 45-50% but unable to assess ventricular wall motion due to imaging difficulty. She is scheduled for catheterization on Monday 04/01/14 (cardiologist Dr. Mercie Eon). She tolerated HD well yesterday and is unsure if they took any fluid off. She was last seen at [facility] for admission 10/14-10/22/15 for acute on chronic combined CHF where she was diuresed 13L and lost 20 lbs.  Ms Nicola Girt also noted that she has had a bloated feeling in her abdomen for months that is not quite pain. She also has diarrhea over this time period but denies melena and hematachezia. She has been eating less as she thinks eating aggravates her diarrhea. She also occasionally has nausea with emesis. She has been getting HD for about three months. She says she was treated with an unknown antibiotic about a month ago for a "blood infection." She denies any fevers, chills, body aches, recent sick contacts, previous respiratory issues such as pneumonias or asthma. She has gotten her flu and pneumonia vaccines. She says there have been no issues taking her medicines except that she is non-compliant with isosorbide dinitrate as it makes her feel dizzy.  In the ED, she received 1 inch nitroglycerin paste with subsequent drop in BP from 204/75. She became hypoxic in the ED when taken off 2L nasal cannula to the 80s. She receives her medical care primarily at [facility] but was told they have no beds so came to [facility].   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 Likely multifactorial in  setting of volume overload from ESRD and chronic combined CHF. Her respiratory status improved after HD. A CXR showed a possible LUL and lingular airspace disease suggestive of PNA vs. Pulmonary edema. She was started on Levaquin for treatment of possible CAP. Patient also desaturated to 85% on room air with ambulation, which corrected to 95% with 2  L oxygen so she was discharged with home oxygen. Additionally, she was discharged on Levaquin 500 mg Q48 hours for 3 doses to complete a 7 day course. She will also need a repeat CXR in 6-8 weeks to ensure resolution of the possible PNA.   ESRD on HD: ESRD likely 2/2 long standing DM1 and HTN. T/Th/Sa HD schedule. She received dialysis on Sat and Mon. She was discharged on Monday and instructed to resume her normal TThS HD schedule.   Combined CHF: See above re: dyspnea. Ms Bilton's last echo was 12/13/13 notable for diffuse hypokinesis with EF 40-45% and grade 1 diastolic dysfunction. She is seen by Dr. Mercie Eon at [facility] who last had a TEE on 03/27/14 notable for EF 45-50% but limited study. He feels she has multivessel disease and needs ischemic work-up with cardiac catheterization scheduled for 04/01/14. At that time he increased her hydralazine to 100 mg tid and started the imdur 30 mg daily that she is non-compliant with. He felt she was volume expanded and needed fluid removal with HD at that time. She is on lasix 80 mg bid, coreg 25 mg bid, ASA 81 mg daily, lisinopril 5 mg daily. A repeat echo was done on 03/30/14 which showed an EF 35-40%, mild LVH, and a moderate pericardial effusion. Patient had no hemodynamic compromise and this was noted on her echo in Oct 2015. EKG was reassuring for no evidence of pericarditis. Unfortunately, the patient was not able to be discharged before 04/01/14 to get her cardiac cath due to the possible PNA. She will follow up with Dr. Norman Clay to reschedule this procedure. Her home medications were continued upon discharge,  except for Lasix which was stopped.   HTN: BP has been elevated in the 170s-190s/70s-90s in the ED. She was given 1 inch nitro paste when BP 204/75 with drop to 125/95 which is now back up at 179/72. At home she is on ASA 81 mg daily, carvedilol 25 mg bid, lasix 80 mg bid, hydralazine 100 mg tid, lisinopril 5 mg daily, isosorbide mononitrate 30 mg daily, pravastatin 40 mg daily. She notes she is not taking isosorbide mononitrate as it makes her feel weak. BP stable in 150s/90s on day of discharge. She was continued and discharged on her home medications except for isosorbide mononitrate.   Type 1 DM: Last HbA1c 8.3 in 11/2013. At home she takes NPH 8 units AM and 24 units PM, Novolin 16 units breakfast, 16 units lunch, 10 units dinner. Her repeat HbA1c was 7.9. She was placed on a sensitive ISS during her hospitalization. She was discharged on her home insulin regimen.   Chronic Diarrhea: This sounds like a chronic and stable issue but history unclear. AXR notes nonobstructive gas pattern and hepatomegaly. Cdiff negative. Will recommend outpatient colonoscopy for possible microscopic colitis.   Discharge Vitals:   BP 153/90 mmHg  Pulse 83  Temp(Src) 98.1 F (36.7 C) (Oral)  Resp 21  Ht 5' (1.524 m)  Wt 82.2 kg (181 lb 3.5 oz)  BMI 35.39 kg/m2  SpO2 96% Physical Exam General: alert, sitting up in HD bed, NAD HEENT: [place]/AT, EOMI, mucus membranes moist CV: RRR, no m/g/r Pulm: mild crackles at bases, breaths non-labored Abd: BS+, soft, non-tender Ext: trace edema in lower extremities Neuro: alert and oriented x 3, no focal deficits  Discharge Labs:  Results for orders placed or performed during the hospital encounter of 03/29/14 (from the past 24 hour(s))  Glucose, capillary     Status: Abnormal  Collection Time: 03/31/14  5:50 PM  Result Value Ref Range   Glucose-Capillary 162 (H) 70 - 99 mg/dL  Glucose, capillary     Status: Abnormal   Collection Time: 03/31/14  9:04 PM  Result Value  Ref Range   Glucose-Capillary 172 (H) 70 - 99 mg/dL  Renal function panel     Status: Abnormal   Collection Time: 04/01/14  5:00 AM  Result Value Ref Range   Sodium 135 135 - 145 mmol/L   Potassium 3.5 3.5 - 5.1 mmol/L   Chloride 99 96 - 112 mmol/L   CO2 29 19 - 32 mmol/L   Glucose, Bld 254 (H) 70 - 99 mg/dL   BUN 25 (H) 6 - 23 mg/dL   Creatinine, Ser 4.44 (H) 0.50 - 1.10 mg/dL   Calcium 8.4 8.4 - 10.5 mg/dL   Phosphorus 3.2 2.3 - 4.6 mg/dL   Albumin 2.3 (L) 3.5 - 5.2 g/dL   GFR calc non Af Amer 10 (L) >90 mL/min   GFR calc Af Amer 12 (L) >90 mL/min   Anion gap 7 5 - 15  CBC     Status: Abnormal   Collection Time: 04/01/14  5:00 AM  Result Value Ref Range   WBC 4.8 4.0 - 10.5 K/uL   RBC 3.48 (L) 3.87 - 5.11 MIL/uL   Hemoglobin 10.0 (L) 12.0 - 15.0 g/dL   HCT 31.5 (L) 36.0 - 46.0 %   MCV 90.5 78.0 - 100.0 fL   MCH 28.7 26.0 - 34.0 pg   MCHC 31.7 30.0 - 36.0 g/dL   RDW 16.3 (H) 11.5 - 15.5 %   Platelets 145 (L) 150 - 400 K/uL  Glucose, capillary     Status: Abnormal   Collection Time: 04/01/14 12:06 PM  Result Value Ref Range   Glucose-Capillary 134 (H) 70 - 99 mg/dL    Signed: Albin Felling, MD 04/01/2014, 3:28 PM    Services Ordered on Discharge: None Equipment Ordered on Discharge: Home oxygen

## 2014-04-01 NOTE — Progress Notes (Addendum)
Inpatient Diabetes Program Recommendations  AACE/ADA: New Consensus Statement on Inpatient Glycemic Control (2013)  Target Ranges:  Prepandial:   less than 140 mg/dL      Peak postprandial:   less than 180 mg/dL (1-2 hours)      Critically ill patients:  140 - 180 mg/dL   Reason for Assessment: Results for Joan Young, Joan Young (MRN [id number]) as of 04/01/2014 10:17  Ref. Range 03/31/2014 04:57 03/31/2014 08:47 03/31/2014 12:00 03/31/2014 17:50 03/31/2014 21:04  Glucose-Capillary Latest Range: 70-99 mg/dL 725261 (H) 366257 (H) 440241 (H) 162 (H) 172 (H)     Diabetes history: Diabetes since age 89 Outpatient Diabetes medications: NPH 10 units q AM, 8 units q PM, Novolin R with meals Current orders for Inpatient glycemic control: Novolog moderate tid with meals  May consider adding Levemir 10 units daily while patient is hospitalized.  Thanks,  Beryl MeagerJenny Torrin Crihfield, RN, BC-ADM Inpatient Diabetes Coordinator Pager [phone]

## 2014-04-01 NOTE — Progress Notes (Signed)
UR completed 

## 2014-04-01 NOTE — Progress Notes (Signed)
Subjective: Yesterday afternoon. patient reported cough and shortness of breath. She had HD this morning and notes she feels better after fluid was taken off.   Objective: Vital signs in last 24 hours: Filed Vitals:   04/01/14 0900 04/01/14 0930 04/01/14 1000 04/01/14 1057  BP:  180/80 185/71 153/90  Pulse: 83 78 81 83  Temp:    98.1 F (36.7 C)  TempSrc:    Oral  Resp:  Height:      Weight:    82.2 kg (181 lb 3.5 oz)  SpO2:    99%   Weight change: -1.146 kg (-2 lb 8.4 oz)  Intake/Output Summary (Last 24 hours) at 04/01/14 1422 Last data filed at 04/01/14 1057  Gross per 24 hour  Intake    120 ml  Output   3500 ml  Net  -3380 ml   Physical Exam General: alert, sitting up in HD bed, NAD HEENT: [place]/AT, EOMI, mucus membranes moist CV: RRR, no m/g/r Pulm: mild crackles at bases, breaths non-labored Abd: BS+, soft, non-tender Ext: trace edema in lower extremities Neuro: alert and oriented x 3, no focal deficits  Lab Results: Basic Metabolic Panel:  Recent Labs Lab 03/31/14 0410 04/01/14 0500  NA 135 135  K 3.5 3.5  CL 99 99  CO2 32 29  GLUCOSE 258* 254*  BUN 13 25*  CREATININE 3.17* 4.44*  CALCIUM 8.2* 8.4  PHOS  --  3.2   Liver Function Tests:  Recent Labs Lab 03/29/14 1349 04/01/14 0500  AST 29  --   ALT 22  --   ALKPHOS 115  --   BILITOT 0.4  --   PROT 5.9*  --   ALBUMIN 2.8* 2.3*    Recent Labs Lab 03/29/14 1349  LIPASE 29   CBC:  Recent Labs Lab 03/29/14 1349  03/31/14 0410 04/01/14 0500  WBC 7.6  < > 6.5 4.8  NEUTROABS 6.0  --  5.1  --   HGB 11.3*  < > 10.9* 10.0*  HCT 36.0  < > 34.6* 31.5*  MCV 89.6  < > 90.1 90.5  PLT 168  < > 169 145*  < > = values in this interval not displayed. CBG:  Recent Labs Lab 03/31/14 0457 03/31/14 0847 03/31/14 1200 03/31/14 1750 03/31/14 2104 04/01/14 1206  GLUCAP 261* 257* 241* 162* 172* 134*   Hemoglobin A1C:  Recent Labs Lab 03/29/14 1355  HGBA1C 7.9*    Urinalysis:  Recent Labs Lab 03/29/14 1812  COLORURINE YELLOW  LABSPEC 1.019  PHURINE 7.0  GLUCOSEU 500*  HGBUR TRACE*  BILIRUBINUR NEGATIVE  KETONESUR 15*  PROTEINUR >300*  UROBILINOGEN 0.2  NITRITE NEGATIVE  LEUKOCYTESUR NEGATIVE    Micro Results: Recent Results (from the past 240 hour(s))  MRSA PCR Screening     Status: None   Collection Time: 03/29/14 11:21 PM  Result Value Ref Range Status   MRSA by PCR NEGATIVE NEGATIVE Final    Comment:        The GeneXpert MRSA Assay (FDA approved for NASAL specimens only), is one component of a comprehensive MRSA colonization surveillance program. It is not intended to diagnose MRSA infection nor to guide or monitor treatment for MRSA infections.   Clostridium Difficile by PCR     Status: None   Collection Time: 03/30/14 12:30 PM  Result Value Ref Range Status   C difficile by pcr NEGATIVE NEGATIVE Final   Studies/Results: Dg Chest 2 View  03/31/2014  CLINICAL DATA:  Dyspnea, hx:htn, DM, CKD-dialysis pt  EXAM: CHEST - 2 VIEW  COMPARISON:  03/29/2014  FINDINGS: There is a new focal region of air airspace consolidation in the anterior left upper lobe and lingula. Patchy poorly marginated airspace opacities in both lung bases. Mild cardiomegaly stable. Small left pleural effusion is now noted. Right IJ tunneled hemodialysis catheter stable. No pneumothorax.  Atheromatous aorta.  Regional bones unremarkable.  IMPRESSION: 1. New anterior left upper lobe and lingular airspace disease suggesting pneumonia, with small left effusion.   Electronically Signed   By: Oley Balmaniel  Hassell M.D.   On: 03/31/2014 08:51   Medications: I have reviewed the patient's current medications. Scheduled Meds: . aspirin EC  81 mg Oral Daily  . calcium carbonate  1 tablet Oral TID WC  . carvedilol  25 mg Oral BID WC  . FLUoxetine  40 mg Oral Daily  . furosemide  80 mg Oral BID  . gabapentin  200 mg Oral QHS  . heparin  5,000 Units Subcutaneous 3 times  per day  . hydrALAZINE  100 mg Oral TID  . insulin aspart  0-15 Units Subcutaneous TID WC  . isosorbide mononitrate  30 mg Oral Daily  . [START ON 04/02/2014] levofloxacin  500 mg Oral Q48H  . lisinopril  10 mg Oral Daily  . pantoprazole  40 mg Oral Daily  . pravastatin  40 mg Oral Daily  . 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 Most likely multifactorial due to volume overload from ESRD and chronic combined CHF. CXR yesterday showed a possible LUL infiltrate concerning for PNA vs. Pulmonary edema. She was started on Levaquin for tx of possible CAP. Given she was experiencing cough, SOB will treat for CAP. She will need outpatient CXR in 6-8 weeks to ensure PNA has resolved.  - Ambulate and record oxygen saturation>> patient desaturated to 85% on room air while ambulating and this improved to 95% on 2 L oxygen.  - Levaquin 500 mg Q48 hours, will discharge for 3 doses to complete 7 day course - Patient to return to normal HD schedule TThS - F/u respiratory panel  - Continue tessalon 100 mg TID PRN cough - Outpatient cardiac catherization will need to be rescheduled   Uncontrolled Hypertension - BP currently 185/70  - Continue Lisinopril 10 mg daily  - Continue Imdur 30 mg daily  - Pt still on PO lasix 80 mg BID, per Dr. Arlean HoppingSchertz this can be stopped as outpatient by her nephrologist - Continue carvedilol 25 mg BID and hydralazine 100 mg TID - Consider starting spironolactone as outpatient if continues to be uncontrolled   Type 1 DM - Last CBG 261. Last A1c 8.3 in 11/2013. Pt at home on NPH 8 u am and 24 u  pm, novolin 16 u breakfast, 16 u lunch, 10 u dinner. - HbA1c 7.9  - Continue sensitive SSI - Continue gabapentin 200 mg TID - Continue pravastatin 40 mg daily  - Continue aspirin 81 mg daily   Chronic diarrhea - Currently asymptomatic - Pt needs outpatient colonoscopy for further evaluation   Diet: Renal/carb modified with 1200 ml fluid restriction VTE Ppx: Heparin SQ Dispo: Discharge today  The patient does have a current PCP (Quentin Angst, MD) and does need an [facility] hospital follow-up appointment after discharge.  The patient does not have transportation limitations that hinder transportation to clinic appointments.  .Services Needed at time of discharge: Y = Yes, Blank = No PT:   OT:   RN:   Equipment:   Other:     LOS: 3 days   Rich Number, MD 04/01/2014, 2:22 PM

## 2014-04-01 NOTE — Discharge Instructions (Signed)
-  You will need to wear oxygen when you are doing physical activity -Please attend your regular dialysis  -Start taking levaquin onTuesdays, Thursdays, and Saturdays -Stop taking lasix and tell your kidney doctor we discontinued this  -Increase lisinopril from 5 mg to 10 mg daily -Take tessalon as needed for cough -You will need a chest xray in 6 weeks (end of March) -Nice meeting you, hope you were satisfied with your stay!

## 2014-04-01 NOTE — Procedures (Signed)
Pt seen on HD.  K 3.5 on 4K bath.  BP high will increase fluid removal.  Will plan HD again in AM as TTS is her usual schedule.

## 2014-04-02 LAB — RESPIRATORY VIRUS PANEL
ADENOVIRUS: NEGATIVE
INFLUENZA A: NEGATIVE
Influenza B: NEGATIVE
Metapneumovirus: NEGATIVE
PARAINFLUENZA 2 A: NEGATIVE
PARAINFLUENZA 3 A: NEGATIVE
Parainfluenza 1: NEGATIVE
Respiratory Syncytial Virus A: NEGATIVE
Respiratory Syncytial Virus B: NEGATIVE
Rhinovirus: NEGATIVE

## 2014-04-02 NOTE — Care Management Note (Signed)
CARE MANAGEMENT NOTE 04/02/2014  Patient:  Joan Young,Joan Young   Account Number:  [id number]  Date Initiated:  03/31/2014  Documentation initiated by:  [facility] Inc[facility]FFRIES,SARAH  Subjective/Objective Assessment:   adm: SOB and DOE     Action/Plan:   discharge planning  04/01/2014 Home Oxygen ordered and pt d/c to home with oxygen.   Anticipated DC Date:  04/01/2014   Anticipated DC Plan:  HOME/SELF CARE      DC Planning Services  CM consult      Choice offered to / List presented to:     DME arranged  [facility]      DME agency  [facility]        Status of service:  Completed, signed off Medicare Important Message given?  NO (If response is "NO", the following Medicare IM given date fields will be blank) Date Medicare IM given:   Medicare IM given by:   Date Additional Medicare IM given:   Additional Medicare IM given by:    Discharge Disposition:  HOME/SELF CARE  Per UR Regulation:    If discussed at Long Length of Stay Meetings, dates discussed:    Comments:   04/01/2014 Pt qualified for home oxygen and [facility] CRoyal RN MPH, case manager, [phone]  03/31/14 13:50 CM received call from [facility] stating order has been cancelled.  CM called RN who confirmed order cancelled.  CM called [facility] DME Fayrene FearingJames to notify of cancellation. Will continue to follow for dispostion. Freddy JakschSarah Jeffries, BSN, CM [phone].  11:50 CM received call from RN stating pt needs home O2. CM called [facility] DME rep, James to plase deliver home oxygen prior to discharge.  Freddy JakschSarah Jeffries, BSN, CM [phone].

## 2014-05-01 ENCOUNTER — Encounter: Payer: Self-pay | Admitting: Internal Medicine

## 2014-05-01 ENCOUNTER — Ambulatory Visit: Payer: BLUE CROSS/BLUE SHIELD | Attending: Internal Medicine | Admitting: Internal Medicine

## 2014-05-01 VITALS — BP 183/80 | HR 76 | Temp 98.2°F | Resp 14 | Ht 59.06 in | Wt 179.0 lb

## 2014-05-01 DIAGNOSIS — L84 Corns and callosities: Secondary | ICD-10-CM

## 2014-05-01 DIAGNOSIS — I10 Essential (primary) hypertension: Secondary | ICD-10-CM

## 2014-05-01 DIAGNOSIS — I5043 Acute on chronic combined systolic (congestive) and diastolic (congestive) heart failure: Secondary | ICD-10-CM

## 2014-05-01 DIAGNOSIS — H538 Other visual disturbances: Secondary | ICD-10-CM

## 2014-05-01 DIAGNOSIS — E1041 Type 1 diabetes mellitus with diabetic mononeuropathy: Secondary | ICD-10-CM | POA: Diagnosis not present

## 2014-05-01 DIAGNOSIS — E1029 Type 1 diabetes mellitus with other diabetic kidney complication: Secondary | ICD-10-CM

## 2014-05-01 DIAGNOSIS — N186 End stage renal disease: Secondary | ICD-10-CM | POA: Diagnosis not present

## 2014-05-01 DIAGNOSIS — E1065 Type 1 diabetes mellitus with hyperglycemia: Secondary | ICD-10-CM

## 2014-05-01 DIAGNOSIS — Z992 Dependence on renal dialysis: Secondary | ICD-10-CM

## 2014-05-01 DIAGNOSIS — IMO0002 Reserved for concepts with insufficient information to code with codable children: Secondary | ICD-10-CM

## 2014-05-01 DIAGNOSIS — E1140 Type 2 diabetes mellitus with diabetic neuropathy, unspecified: Secondary | ICD-10-CM | POA: Insufficient documentation

## 2014-05-01 DIAGNOSIS — M7989 Other specified soft tissue disorders: Secondary | ICD-10-CM

## 2014-05-01 LAB — GLUCOSE, POCT (MANUAL RESULT ENTRY): POC GLUCOSE: 226 mg/dL — AB (ref 70–99)

## 2014-05-01 MED ORDER — INSULIN REGULAR HUMAN 100 UNIT/ML IJ SOLN: 10.0000 [IU] | Freq: Three times a day (TID) | INTRAMUSCULAR | Status: DC

## 2014-05-01 MED ORDER — HYDRALAZINE HCL 100 MG PO TABS: 100.0000 mg | ORAL_TABLET | Freq: Three times a day (TID) | ORAL | Status: DC

## 2014-05-01 MED ORDER — LISINOPRIL 10 MG PO TABS: 10.0000 mg | ORAL_TABLET | Freq: Every day | ORAL | Status: DC

## 2014-05-01 MED ORDER — ZOLPIDEM TARTRATE 10 MG PO TABS: 10.0000 mg | ORAL_TABLET | Freq: Every evening | ORAL | Status: DC | PRN

## 2014-05-01 MED ORDER — INSULIN NPH (HUMAN) (ISOPHANE) 100 UNIT/ML ~~LOC~~ SUSP: 8.0000 [IU] | Freq: Two times a day (BID) | SUBCUTANEOUS | Status: DC

## 2014-05-01 MED ORDER — GABAPENTIN 100 MG PO CAPS: 100.0000 mg | ORAL_CAPSULE | Freq: Three times a day (TID) | ORAL | Status: DC

## 2014-05-01 MED ORDER — OMEPRAZOLE 20 MG PO CPDR: 20.0000 mg | DELAYED_RELEASE_CAPSULE | Freq: Every day | ORAL | Status: DC

## 2014-05-01 MED ORDER — FLUOXETINE HCL 20 MG PO CAPS: 20.0000 mg | ORAL_CAPSULE | Freq: Every day | ORAL | Status: DC

## 2014-05-01 MED ORDER — CARVEDILOL 25 MG PO TABS: 25.0000 mg | ORAL_TABLET | Freq: Two times a day (BID) | ORAL | Status: DC

## 2014-05-01 MED ORDER — GABAPENTIN 100 MG PO CAPS: 200.0000 mg | ORAL_CAPSULE | Freq: Three times a day (TID) | ORAL | Status: DC

## 2014-05-01 NOTE — Progress Notes (Signed)
Patient ID: Joan Young, female   DOB: October 19, 1960, 54 y.o.   MRN: [id number]  CC: follow up  HPI: Joan Young is a 54 y.o. female here today for a follow up visit.  Patient has past medical history of ESRD on T/Th/Sa HD, DM1, CHF, HTN, HLD.  She was discharged from the hospital on 04/01/14 for Pneumonia and CHF. She was released with oxygen therapy for a total of 8 weeks. Patient's last Echo on 03/30/14 which showed an EF 35-40%, mild LVH, and a moderate pericardial effusion. He states that she is waiting to have the cardiac cath once she improves from the PNA. She needs repeat CXR on 3/15-3/29.  She was told that if her cardiac cath is normal she will have a open heart with bypass.   Has been requiring less oxygen but was told that her oxygen sat is still low in dialysis.   She states that dialysis is removing more fluid due to difficulty breathing and fluid around her lungs.  AV fistula in LUA has matured and is now being used. Will have LU chest cathether removed. She is wanted to switch to peritoneal dialysis because being in the dialysis center is depressing. She states that if she does not get approved to stay in [place] she will have to go back to [place]. In [place] they only do peritoneal dialysis. She does not feel depressed all the time just on dialysis days. No one in the center speaks her language.  Patient has concerns of right lower leg pain and swelling.     Patient has No headache, No chest pain, No abdominal pain - No Nausea, No new weakness tingling or numbness, No Cough - SOB.  Allergies  Allergen Reactions  . Hydromorphone Itching  . Latex Rash  . Tape Rash   Past Medical History  Diagnosis Date  . Obesity   . Macular degeneration disease   . Depression   . High cholesterol Dx 2005  . Hypertension Dx 5885  . Diabetes mellitus without complication DX 0277    type 94 since 54 years old but reports starting insulin at 54 years old  . Renal disorder   .  Dialysis patient    Current Outpatient Prescriptions on File Prior to Visit  Medication Sig Dispense Refill  . acetaminophen (TYLENOL) 325 MG tablet Take 650 mg by mouth every 6 (six) hours as needed (pain).    Marland Kitchen aspirin EC 81 MG tablet Take 81 mg by mouth daily.    . benzonatate (TESSALON) 100 MG capsule Take 1 capsule (100 mg total) by mouth 3 (three) times daily as needed for cough. 20 capsule 0  . calcium carbonate (TUMS) 500 MG chewable tablet Chew 1 tablet (200 mg of elemental calcium total) by mouth 3 (three) times daily with meals. 90 tablet 2  . carvedilol (COREG) 25 MG tablet Take 1 tablet (25 mg total) by mouth 2 (two) times daily with a meal. 60 tablet 3  . FLUoxetine (PROZAC) 20 MG capsule Take 1 capsule (20 mg total) by mouth daily. (Patient taking differently: Take 40 mg by mouth daily. ) 30 capsule 2  . gabapentin (NEURONTIN) 100 MG capsule Take 2 capsules (200 mg total) by mouth 3 (three) times daily. (Patient taking differently: Take 200 mg by mouth at bedtime. ) 180 capsule 0  . glucose blood (FREESTYLE INSULINX TEST) test strip Use as instructed 100 each 12  . glucose blood test strip Use as instructed 3 times daily  100 each 12  . glucose monitoring kit (FREESTYLE) monitoring kit 1 each by Does not apply route as needed for other. E11.9  Test up to 4 times daily 1 each 0  . hydrALAZINE (APRESOLINE) 100 MG tablet Take 100 mg by mouth 3 (three) times daily.    . insulin NPH Human (HUMULIN N,NOVOLIN N) 100 UNIT/ML injection Inject 0.08-0.24 mLs (8-24 Units total) into the skin 2 (two) times daily. 8 units in the morning and 24 units at after supper (Patient taking differently: Inject 8-10 Units into the skin 2 (two) times daily. Inject 10 units every morning and 8 units every night) 10 mL 3  . insulin regular (NOVOLIN R,HUMULIN R) 100 units/mL injection Inject 0.1-0.16 mLs (10-16 Units total) into the skin 3 (three) times daily before meals. 16 units every morning 16 units at lunch  10 units at bedtime.  Per sliding scale (Patient taking differently: Inject 0-12 Units into the skin 3 (three) times daily before meals. Per sliding scale) 10 mL 3  . isosorbide mononitrate (IMDUR) 30 MG 24 hr tablet Take 30 mg by mouth daily.     . Lancets (FREESTYLE) lancets Use as instructed 100 each 12  . levofloxacin (LEVAQUIN) 500 MG tablet Take 1 tablet (500 mg total) by mouth every other day. 3 tablet 0  . lisinopril (PRINIVIL,ZESTRIL) 10 MG tablet Take 1 tablet (10 mg total) by mouth daily. 30 tablet 5  . omeprazole (PRILOSEC) 20 MG capsule Take 20 mg by mouth daily.   2  . pravastatin (PRAVACHOL) 40 MG tablet Take 40 mg by mouth daily.    Marland Kitchen zolpidem (AMBIEN) 10 MG tablet Take 1 tablet (10 mg total) by mouth at bedtime as needed for sleep. 30 tablet 0   No current facility-administered medications on file prior to visit.   Family History  Problem Relation Age of Onset  . Hypertension Father   . Diabetes Father   . Kidney failure Father 6  . Kidney failure Sister 30  . Kidney failure Brother 50  . Kidney failure Sister 41   History   Social History  . Marital Status: Married    Spouse Name: N/A  . Number of Children: N/A  . Years of Education: 6th grade   Occupational History  . homemaker    Social History Main Topics  . Smoking status: Never Smoker   . Smokeless tobacco: Not on file  . Alcohol Use: No  . Drug Use: No  . Sexual Activity: Not on file   Other Topics Concern  . Not on file   Social History Narrative    Review of Systems: See HPI   Objective:   Filed Vitals:   05/01/14 1113  BP: 183/80  Pulse: 76  Temp: 98.2 F (36.8 C)  Resp: 14    Physical Exam  Constitutional: She is oriented to person, place, and time.  Cardiovascular: Normal rate, regular rhythm and normal heart sounds.   Pulmonary/Chest: Effort normal and breath sounds normal.  Abdominal: Soft. Bowel sounds are normal.  Musculoskeletal:  Right calf swelling, not hard Left  arch swelling--callus??  Neurological: She is alert and oriented to person, place, and time.  Skin: Skin is warm and dry.     Lab Results  Component Value Date   WBC 4.8 04/01/2014   HGB 10.0* 04/01/2014   HCT 31.5* 04/01/2014   MCV 90.5 04/01/2014   PLT 145* 04/01/2014   Lab Results  Component Value Date   CREATININE 4.44* 04/01/2014  BUN 25* 04/01/2014   NA 135 04/01/2014   K 3.5 04/01/2014   CL 99 04/01/2014   CO2 29 04/01/2014    Lab Results  Component Value Date   HGBA1C 7.9* 03/29/2014   Lipid Panel  No results found for: CHOL, TRIG, HDL, CHOLHDL, VLDL, LDLCALC     Assessment and plan:   Joan Young was seen today for follow-up.  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 8 units in the morning and 24 units at after supper -     insulin regular (NOVOLIN R,HUMULIN R) 100 units/mL injection; Inject 0.1-0.16 mLs (10-16 Units total) into the skin 3 (three) times daily before meals. 16 units every morning 16 units at lunch 10 units at bedtime.  Per sliding scale Patients diabetes is well control as evidence by consistently low a1c.  Patient will continue with current therapy and continue to make necessary lifestyle changes.  Reviewed foot care, diet, exercise, annual health maintenance with patient.   Diabetic mononeuropathy associated with type 1 diabetes mellitus Will refill gabapentin but she may only take 100 mg TID due to ESRD -     gabapentin (NEURONTIN) 100 MG capsule; Take 1 capsules (200 mg total) by mouth 3 (three) times daily. Essential hypertension Uncontrolled. Patient will need to consult with Nephrology on any changes due to complex regimen. -     hydrALAZINE (APRESOLINE) 100 MG tablet; Take 1  tablet (100 mg total) by mouth 3 (three) times daily. -     lisinopril (PRINIVIL,ZESTRIL) 10 MG tablet; Take 1 tablet (10 mg total) by mouth daily.  Acute on chronic combined systolic and diastolic congestive heart failure Orders: -    Refill carvedilol (COREG) 25 MG tablet; Take 1 tablet (25 mg total) by mouth 2 (two) times daily with a meal.  Left leg swelling Orders: -     Lower Extremity Venous Duplex Left; Future---r/o DVT  Blurred vision Orders: -     Ambulatory referral to Ophthalmology---need diabetic eye scan as well  Callus Orders: -     Ambulatory referral to Podiatry  Refilled  -     FLUoxetine (PROZAC) 20 MG capsule; Take 1 capsule (20 mg total) by mouth daily. -     omeprazole (PRILOSEC) 20 MG capsule; Take 1 capsule (20 mg total) by mouth daily. -     zolpidem (AMBIEN) 10 MG tablet; Take 1 tablet (10 mg total) by mouth at bedtime as needed for sleep.  >50% spent on counseling and reviewing medical records  Due to language barrier, an interpreter was present during the history-taking and subsequent discussion (and for part of the physical exam) with this patient.  Return in about 3 months (around 08/01/2014).        Chari Manning, NP-C [facility] [phone] 05/01/2014, 11:30 AM

## 2014-05-01 NOTE — Progress Notes (Signed)
Pt is here following up on her HTN and diabetes. Pt is requesting a referral to an eye doctor. Pt states that the gabapentin is not helping the numbness in her legs.

## 2014-05-02 ENCOUNTER — Ambulatory Visit (HOSPITAL_COMMUNITY): Payer: BLUE CROSS/BLUE SHIELD

## 2014-05-03 ENCOUNTER — Ambulatory Visit (HOSPITAL_COMMUNITY): Payer: BLUE CROSS/BLUE SHIELD

## 2014-05-22 ENCOUNTER — Ambulatory Visit: Payer: Self-pay | Admitting: Podiatry

## 2014-05-23 ENCOUNTER — Other Ambulatory Visit: Payer: Self-pay | Admitting: Internal Medicine

## 2014-05-23 MED ORDER — INSULIN SYRINGES (DISPOSABLE) U-100 0.5 ML MISC: Status: DC

## 2014-06-12 ENCOUNTER — Ambulatory Visit (INDEPENDENT_AMBULATORY_CARE_PROVIDER_SITE_OTHER): Payer: BLUE CROSS/BLUE SHIELD | Admitting: Podiatry

## 2014-06-12 ENCOUNTER — Ambulatory Visit (INDEPENDENT_AMBULATORY_CARE_PROVIDER_SITE_OTHER): Payer: BLUE CROSS/BLUE SHIELD

## 2014-06-12 ENCOUNTER — Encounter: Payer: Self-pay | Admitting: Podiatry

## 2014-06-12 ENCOUNTER — Ambulatory Visit: Payer: Self-pay

## 2014-06-12 VITALS — BP 160/84 | HR 69 | Resp 12

## 2014-06-12 DIAGNOSIS — R52 Pain, unspecified: Secondary | ICD-10-CM | POA: Diagnosis not present

## 2014-06-12 DIAGNOSIS — M14671 Charcot's joint, right ankle and foot: Secondary | ICD-10-CM

## 2014-06-12 NOTE — Progress Notes (Signed)
   Subjective:    Patient ID: Joan Young, female    DOB: 10/20/1960, 54 y.o.   MRN: [id number]  HPI N-flat difficulty walking L- right foot arch, left great toe D- 9 months O- slowly C- worse A-wearing shoes,walking T- none  Patient is currently visiting [place] daughter from [place]. Daughter states her right foot foot is appeared in this shape for approximately 9 months without any treatment. She said her mother lives because of the right foot  Review of Systems  Constitutional: Positive for unexpected weight change.  HENT: Positive for ear pain.   Respiratory: Positive for cough.   Musculoskeletal: Positive for myalgias and gait problem.  Neurological: Positive for numbness.       Objective:   Physical Exam Patient appears orientated 3, however, is not able to speak English and her daughter is translating for her in the treatment room today and she responds directly to her daughter's questions in Spanish  Vascular: DP pulses 2/4 bilaterally PT pulses 2/4 right and 1/4 left  Neurological: Sensation to 10 g monofilament wire intact 3/5 right and 0/5 left Vibratory sedation nonreactive right and reactive left Ankle reflexes reactive bilaterally  Dermatological: No open skin lesions noted There is no warmth, erythema, edema in the right foot Small plantar keratoses plantar right midfoot  Musculoskeletal: The right foot he has a rocker shaped plantar appearance and is shorter than the right Restricted range of motion right rear foot area  X-ray examination demonstrates Charcot's midfoot arthropathy with dorsal dislocation of the midfoot and prominence of the plantar aspect. The bone quality appears to be adequateat this time and the bones appear to have consolidated in a rocker shaped appearance         Assessment & Plan:   Assessment: Charcot's osteoarthropathy right foot that appears to be mature and consolidated at this  time Diabetic peripheral neuropathy  Plan: I made patient's daughter aware that the right foot was associated with with diabetic neuropathy resulting in deformity in the right foot. The right foot appears stable and consolidated this time with deformity Am referring patient to [employer] for custom molded shoes with Plastizote insoles for the indication of Charcot's osteoarthropathy with deformity right foot  Limits standing walking till patient receives the custom shoes Reappoint upon receipt of custom molded shoes

## 2014-06-12 NOTE — Patient Instructions (Signed)
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 The bones of soft and then re-hardened in the deformed position. I am referring you to an orthotist to have custom molded shoes to protect the right foot and attempt to prevent further deformity Return upon receipt of custom molded shoes  Diabetes and Foot Care Diabetes may cause you to have problems because of poor blood supply (circulation) to your feet and legs. This may cause the skin on your feet to become thinner, break easier, and heal more slowly. Your skin may become dry, and the skin may peel and crack. You may also have nerve damage in your legs and feet causing decreased feeling in them. You may not notice minor injuries to your feet that could lead to infections or more serious problems. Taking care of your feet is one of the most important things you can do for yourself.  HOME CARE INSTRUCTIONS  Wear shoes at all times, even in the house. Do not go barefoot. Bare feet are easily injured.  Check your feet daily for blisters, cuts, and redness. If you cannot see the bottom of your feet, use a mirror or ask someone for help.  Wash your feet with warm water (do not use hot water) and mild soap. Then pat your feet and the areas between your toes until they are completely dry. Do not soak your feet as this can dry your skin.  Apply a moisturizing lotion or petroleum jelly (that does not contain alcohol and is unscented) to the skin on your feet and to dry, brittle toenails. Do not apply lotion between your toes.  Trim your toenails straight across. Do not dig under them or around the cuticle. File the edges of your nails with an emery board or nail file.  Do not cut corns or calluses or try to remove them with medicine.  Wear clean socks or stockings every day. Make sure they are not too tight. Do not wear knee-high stockings since they may decrease blood flow to your legs.  Wear shoes that fit properly and have  enough cushioning. To break in new shoes, wear them for just a few hours a day. This prevents you from injuring your feet. Always look in your shoes before you put them on to be sure there are no objects inside.  Do not cross your legs. This may decrease the blood flow to your feet.  If you find a minor scrape, cut, or break in the skin on your feet, keep it and the skin around it clean and dry. These areas may be cleansed with mild soap and water. Do not cleanse the area with peroxide, alcohol, or iodine.  When you remove an adhesive bandage, be sure not to damage the skin around it.  If you have a wound, look at it several times a day to make sure it is healing.  Do not use heating pads or hot water bottles. They may burn your skin. If you have lost feeling in your feet or legs, you may not know it is happening until it is too late.  Make sure your health care provider performs a complete foot exam at least annually or more often if you have foot problems. Report any cuts, sores, or bruises to your health care provider immediately. SEEK MEDICAL CARE IF:   You have an injury that is not healing.  You have cuts or breaks in the skin.  You have an ingrown nail.  You notice redness  on your legs or feet.  You feel burning or tingling in your legs or feet.  You have pain or cramps in your legs and feet.  Your legs or feet are numb.  Your feet always feel cold. SEEK IMMEDIATE MEDICAL CARE IF:   There is increasing redness, swelling, or pain in or around a wound.  There is a red line that goes up your leg.  Pus is coming from a wound.  You develop a fever or as directed by your health care provider.  You notice a bad smell coming from an ulcer or wound. Document Released: 02/13/2000 Document Revised: 10/18/2012 Document Reviewed: 07/25/2012 [facility] Patient Information 2015 [place], [place]. This information is not intended to replace advice given to you by your health care  provider. Make sure you discuss any questions you have with your health care provider.

## 2014-06-14 ENCOUNTER — Other Ambulatory Visit: Payer: Self-pay | Admitting: Internal Medicine

## 2014-06-19 ENCOUNTER — Telehealth: Payer: Self-pay

## 2014-06-19 MED ORDER — GABAPENTIN 100 MG PO CAPS: 100.0000 mg | ORAL_CAPSULE | Freq: Three times a day (TID) | ORAL | Status: DC

## 2014-06-19 NOTE — Telephone Encounter (Signed)
Patient daughter called requesting a refill on gabapentin [facility]

## 2014-06-20 ENCOUNTER — Telehealth: Payer: Self-pay | Admitting: Internal Medicine

## 2014-06-20 NOTE — Telephone Encounter (Signed)
Patient has come in today to receive a medication refill on Gabapentin 100mg ; patient noticed that script was filled under a different doctor and the instructions had been changed; please f/u with patient about this change asap;

## 2014-06-27 ENCOUNTER — Other Ambulatory Visit: Payer: Self-pay | Admitting: Internal Medicine

## 2014-07-08 ENCOUNTER — Encounter: Payer: Self-pay | Admitting: Internal Medicine

## 2014-07-08 ENCOUNTER — Ambulatory Visit: Payer: BLUE CROSS/BLUE SHIELD | Attending: Internal Medicine | Admitting: Internal Medicine

## 2014-07-08 VITALS — BP 153/93 | HR 79 | Temp 98.4°F | Resp 16 | Ht 59.0 in | Wt 181.0 lb

## 2014-07-08 DIAGNOSIS — Z794 Long term (current) use of insulin: Secondary | ICD-10-CM | POA: Insufficient documentation

## 2014-07-08 DIAGNOSIS — R51 Headache: Secondary | ICD-10-CM | POA: Insufficient documentation

## 2014-07-08 DIAGNOSIS — E109 Type 1 diabetes mellitus without complications: Secondary | ICD-10-CM | POA: Diagnosis not present

## 2014-07-08 DIAGNOSIS — R05 Cough: Secondary | ICD-10-CM

## 2014-07-08 DIAGNOSIS — R519 Headache, unspecified: Secondary | ICD-10-CM

## 2014-07-08 DIAGNOSIS — R059 Cough, unspecified: Secondary | ICD-10-CM

## 2014-07-08 LAB — GLUCOSE, POCT (MANUAL RESULT ENTRY): POC Glucose: 230 mg/dl — AB (ref 70–99)

## 2014-07-08 LAB — POCT GLYCOSYLATED HEMOGLOBIN (HGB A1C): Hemoglobin A1C: 9.4

## 2014-07-08 MED ORDER — CYCLOBENZAPRINE HCL 5 MG PO TABS: 5.0000 mg | ORAL_TABLET | Freq: Three times a day (TID) | ORAL | Status: DC | PRN

## 2014-07-08 MED ORDER — ALBUTEROL SULFATE HFA 108 (90 BASE) MCG/ACT IN AERS: 2.0000 | INHALATION_SPRAY | Freq: Four times a day (QID) | RESPIRATORY_TRACT | Status: AC | PRN

## 2014-07-08 NOTE — Progress Notes (Signed)
Patient ID: Joan Young, female   DOB: [birth year], 54 y.o.   MRN: [id number]  CC: cough, headaches   HPI: Joan Young is a 54 y.o. female here today for a follow up visit.  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 She presents to clinic for headaches that have progressively worsened over the past 3 months ago. She reports that the headaches are daily and begin in her occipital region. Pain is described as a throbbing sensation with ringing in her ears, nausea, dizziness, photophobia. She rates the headaches as severe and has tried tylenol for pain but it only gives her relief for a short time. She reports similar headaches in the past but they were not as severe. She feels like her vision is worse than before. Had monthly vision exams in [place], none since her in [place].    Patient reports that she has been working with a cardiologist at [facility] to Mercie Eon MD) adjust her antihypertensive medications. She reports that they have been seeing her frequently to help with BP control.   She complains of cough for one month. Was given amoxicillin, allegra, claritin, mucinex, and flonase. Nephrology believed cough was related to allergies but she has yet to get relief with medication. She has symptoms of hoarse voice and PND. She dnies sore throat, fever, chills, sputum production, SOB.   Allergies  Allergen Reactions  . Hydromorphone Itching  . Latex Rash  . Tape Rash   Past Medical History  Diagnosis Date  . Obesity   . Macular degeneration disease   . Depression   . High cholesterol Dx 2005  . Hypertension Dx 9562  . Diabetes mellitus without complication DX 1308    type 56 since 54 years old but reports starting insulin at 54 years old  . Renal disorder   . Dialysis patient    Current Outpatient Prescriptions on File Prior to Visit  Medication Sig Dispense Refill  . acetaminophen (TYLENOL) 325 MG tablet Take 650 mg by mouth every 6  (six) hours as needed (pain).    Marland Kitchen aspirin EC 81 MG tablet Take 81 mg by mouth daily.    . B Complex-C-Folic Acid (RENAL) 1 MG CAPS Take by mouth.    . benzonatate (TESSALON) 100 MG capsule Take 1 capsule (100 mg total) by mouth 3 (three) times daily as needed for cough. 20 capsule 0  . calcium carbonate (TUMS) 500 MG chewable tablet Chew 1 tablet (200 mg of elemental calcium total) by mouth 3 (three) times daily with meals. 90 tablet 2  . carvedilol (COREG) 25 MG tablet Take 1 tablet (25 mg total) by mouth 2 (two) times daily with a meal. 60 tablet 3  . FLUoxetine (PROZAC) 20 MG capsule Take 1 capsule (20 mg total) by mouth daily. 30 capsule 2  . gabapentin (NEURONTIN) 100 MG capsule Take 1 capsule (100 mg total) by mouth 3 (three) times daily. 180 capsule 0  . glucose blood (FREESTYLE INSULINX TEST) test strip Use as instructed 100 each 12  . glucose blood test strip Use as instructed 3 times daily 100 each 12  . glucose monitoring kit (FREESTYLE) monitoring kit 1 each by Does not apply route as needed for other. E11.9  Test up to 4 times daily 1 each 0  . hydrALAZINE (APRESOLINE) 100 MG tablet TAKE 1 TABLET BY MOUTH 3 TIMES DAILY. 90 tablet 0  . insulin NPH Human (HUMULIN N,NOVOLIN N) 100 UNIT/ML injection  Inject 0.08-0.24 mLs (8-24 Units total) into the skin 2 (two) times daily. 8 units in the morning and 24 units at after supper 10 mL 3  . insulin regular (NOVOLIN R,HUMULIN R) 100 units/mL injection Inject 0.1-0.16 mLs (10-16 Units total) into the skin 3 (three) times daily before meals. 16 units every morning 16 units at lunch 10 units at bedtime.  Per sliding scale 10 mL 3  . Insulin Syringes, Disposable, U-100 0.5 ML MISC Give insulin BID for E10,29 100 each 3  . isosorbide mononitrate (IMDUR) 30 MG 24 hr tablet Take 30 mg by mouth daily.     . Lancets (FREESTYLE) lancets Use as instructed 100 each 12  . omeprazole (PRILOSEC) 20 MG capsule Take 1 capsule (20 mg total) by mouth daily. 30  capsule 2  . pravastatin (PRAVACHOL) 40 MG tablet Take 40 mg by mouth daily.    Marland Kitchen zolpidem (AMBIEN) 10 MG tablet Take 1 tablet (10 mg total) by mouth at bedtime as needed for sleep. 30 tablet 0  . levofloxacin (LEVAQUIN) 500 MG tablet Take 1 tablet (500 mg total) by mouth every other day. (Patient not taking: Reported on 07/08/2014) 3 tablet 0  . lisinopril (PRINIVIL,ZESTRIL) 10 MG tablet Take 1 tablet (10 mg total) by mouth daily. (Patient not taking: Reported on 07/08/2014) 30 tablet 5  . ranitidine (ZANTAC) 150 MG tablet Take 150 mg by mouth 2 (two) times daily.     No current facility-administered medications on file prior to visit.   Family History  Problem Relation Age of Onset  . Hypertension Father   . Diabetes Father   . Kidney failure Father 58  . Kidney failure Sister 52  . Kidney failure Brother 43  . Kidney failure Sister 27   History   Social History  . Marital Status: Married    Spouse Name: N/A  . Number of Children: N/A  . Years of Education: 6th grade   Occupational History  . homemaker    Social History Main Topics  . Smoking status: Never Smoker   . Smokeless tobacco: Never Used  . Alcohol Use: No  . Drug Use: No  . Sexual Activity: Not on file   Other Topics Concern  . Not on file   Social History Narrative    Review of Systems: See HPI   Objective:   Filed Vitals:   07/08/14 1012  BP: 153/78  Pulse: 79  Temp: 98.4 F (36.9 C)  Resp: 16    Physical Exam  Constitutional: She is oriented to person, place, and time.  HENT:  Right Ear: External ear normal.  Left Ear: External ear normal.  Mouth/Throat: Oropharynx is clear and moist.  Eyes: EOM are normal. Pupils are equal, round, and reactive to light. Right eye exhibits no discharge. Left eye exhibits no discharge.  Cardiovascular: Normal rate, regular rhythm and normal heart sounds.   Pulmonary/Chest: Effort normal and breath sounds normal. She has no wheezes. She exhibits no tenderness.   Musculoskeletal: She exhibits no edema.  Lymphadenopathy:    She has no cervical adenopathy.  Neurological: She is alert and oriented to person, place, and time.  Skin: Skin is warm and dry.     Lab Results  Component Value Date   WBC 4.8 04/01/2014   HGB 10.0* 04/01/2014   HCT 31.5* 04/01/2014   MCV 90.5 04/01/2014   PLT 145* 04/01/2014   Lab Results  Component Value Date   CREATININE 4.44* 04/01/2014   BUN 25*  04/01/2014   NA 135 04/01/2014   K 3.5 04/01/2014   CL 99 04/01/2014   CO2 29 04/01/2014    Lab Results  Component Value Date   HGBA1C 9.40 07/08/2014   Lipid Panel  No results found for: CHOL, TRIG, HDL, CHOLHDL, VLDL, LDLCALC     Assessment and plan:   Jovan was seen today for follow-up.  Diagnoses and all orders for this visit:  Cough Orders: -     DG Chest 2 View; Future-- r/o any acute process. Has had pneumonia in past 3 months. -     albuterol (PROVENTIL HFA;VENTOLIN HFA) 108 (90 BASE) MCG/ACT inhaler; Inhale 2 puffs into the lungs every 6 (six) hours as needed for shortness of breath. And cough I do feel like cough is allergy related. If no relief with inhaler use she may need to see a allergist.  Persistent headaches Orders: -     cyclobenzaprine (FLEXERIL) 5 MG tablet; Take 1 tablet (5 mg total) by mouth 3 (three) times daily as needed. For headaches Believe patient is having migraines but may be exacerbated by elevated BP's. Very limited options for headache control due to CKD.   Type 1 diabetes mellitus without complication Orders: -     Glucose (CBG) -     HgB A1c -     Ambulatory referral to Ophthalmology.  Vision changes are likely related to uncontrolled DM and several other co-morbidities   Return in about 2 weeks (around 07/22/2014) for Diabetes Mellitus.  Due to language barrier, an interpreter was present during the history-taking and subsequent discussion (and for part of the physical exam) with this patient.         Chari Manning, [facility] [phone] 07/08/2014, 10:33 AM

## 2014-07-08 NOTE — Patient Instructions (Addendum)
Cyclobenzaprine will be for headaches. May take as soon as you feel headache come on.  Albuterol inhaler for cough. Continue to take claritin and flonase nasal spray to see if this improves cough any.

## 2014-07-08 NOTE — Progress Notes (Signed)
Pt is here today following up on her diabetes, HTN and her hyperlipidemia. Pt reports having a constant severe headache for a few weeks. Pt has had a prolonged cough for over a month. Interpreter [place].

## 2014-07-31 ENCOUNTER — Ambulatory Visit: Payer: BLUE CROSS/BLUE SHIELD | Admitting: Internal Medicine

## 2014-07-31 ENCOUNTER — Ambulatory Visit (HOSPITAL_COMMUNITY)
Admission: RE | Admit: 2014-07-31 | Discharge: 2014-07-31 | Disposition: A | Discharge status: Home / Self Care | Payer: BLUE CROSS/BLUE SHIELD | Source: Ambulatory Visit | Attending: Internal Medicine | Admitting: Internal Medicine

## 2014-07-31 DIAGNOSIS — J189 Pneumonia, unspecified organism: Secondary | ICD-10-CM | POA: Insufficient documentation

## 2014-07-31 DIAGNOSIS — R059 Cough, unspecified: Secondary | ICD-10-CM

## 2014-07-31 DIAGNOSIS — R05 Cough: Secondary | ICD-10-CM

## 2014-08-05 ENCOUNTER — Telehealth: Payer: Self-pay | Admitting: Internal Medicine

## 2014-08-05 ENCOUNTER — Telehealth: Payer: Self-pay | Admitting: *Deleted

## 2014-08-05 NOTE — Telephone Encounter (Signed)
Pi [phone] left message

## 2014-08-05 NOTE — Telephone Encounter (Signed)
-----   Message from Ambrose FinlandValerie A Keck, NP sent at 07/31/2014  6:46 PM EDT ----- Chest xray is negative

## 2014-08-05 NOTE — Telephone Encounter (Signed)
Pt returning nurse's call, please f/u with interpreter.

## 2014-08-05 NOTE — Telephone Encounter (Signed)
Patient's daughter is returning phone call from nurse, please f/u with pt with an interpreter.

## 2014-08-06 ENCOUNTER — Telehealth: Payer: Self-pay | Admitting: Internal Medicine

## 2014-08-06 NOTE — Telephone Encounter (Signed)
Patient's daughter called requesting to speak to nurse to review results. Please f/u with patient

## 2014-08-12 ENCOUNTER — Ambulatory Visit: Payer: BLUE CROSS/BLUE SHIELD | Attending: Internal Medicine | Admitting: Internal Medicine

## 2014-08-12 ENCOUNTER — Encounter: Payer: Self-pay | Admitting: Internal Medicine

## 2014-08-12 VITALS — BP 177/74 | HR 72 | Temp 98.3°F | Resp 16 | Wt 181.0 lb

## 2014-08-12 DIAGNOSIS — H9312 Tinnitus, left ear: Secondary | ICD-10-CM | POA: Insufficient documentation

## 2014-08-12 DIAGNOSIS — R059 Cough, unspecified: Secondary | ICD-10-CM

## 2014-08-12 DIAGNOSIS — IMO0002 Reserved for concepts with insufficient information to code with codable children: Secondary | ICD-10-CM

## 2014-08-12 DIAGNOSIS — Z794 Long term (current) use of insulin: Secondary | ICD-10-CM | POA: Insufficient documentation

## 2014-08-12 DIAGNOSIS — R519 Headache, unspecified: Secondary | ICD-10-CM

## 2014-08-12 DIAGNOSIS — E1065 Type 1 diabetes mellitus with hyperglycemia: Secondary | ICD-10-CM

## 2014-08-12 DIAGNOSIS — E1029 Type 1 diabetes mellitus with other diabetic kidney complication: Secondary | ICD-10-CM | POA: Diagnosis not present

## 2014-08-12 DIAGNOSIS — R05 Cough: Secondary | ICD-10-CM

## 2014-08-12 DIAGNOSIS — R51 Headache: Secondary | ICD-10-CM

## 2014-08-12 LAB — GLUCOSE, POCT (MANUAL RESULT ENTRY): POC Glucose: 261 mg/dl — AB (ref 70–99)

## 2014-08-12 LAB — BASIC METABOLIC PANEL
BUN: 44 mg/dL — ABNORMAL HIGH (ref 6–23)
CHLORIDE: 102 meq/L (ref 96–112)
CO2: 24 meq/L (ref 19–32)
CREATININE: 6.37 mg/dL — AB (ref 0.50–1.10)
Calcium: 9.3 mg/dL (ref 8.4–10.5)
Glucose, Bld: 197 mg/dL — ABNORMAL HIGH (ref 70–99)
POTASSIUM: 5 meq/L (ref 3.5–5.3)
Sodium: 139 mEq/L (ref 135–145)

## 2014-08-12 MED ORDER — BENZONATATE 100 MG PO CAPS: 100.0000 mg | ORAL_CAPSULE | Freq: Three times a day (TID) | ORAL | Status: DC | PRN

## 2014-08-12 MED ORDER — ACETAMINOPHEN-CODEINE #3 300-30 MG PO TABS: 1.0000 | ORAL_TABLET | Freq: Three times a day (TID) | ORAL | Status: DC | PRN

## 2014-08-12 NOTE — Progress Notes (Signed)
Complaining of HA and lt ear ache  Stated feeling a a strange noise on head  Today elevated glucose, took DM meds at 8:30 with a light breakfast  Glucose running at home 40-and two or three time was undetectable  .most of the time is running low per pt

## 2014-08-12 NOTE — Progress Notes (Deleted)
   Subjective:    Patient ID: Joan Young, female    DOB: 1960/10/18, 54 y.o.   MRN: [id number]  Hand Pain   Otalgia       Review of Systems  HENT: Positive for ear pain.        Objective:   Physical Exam        Assessment & Plan:    Still having headaches. Feels like her head is underwater. Feels pressure in left ear and head. Has been taking flexeril for headaches but has not helped any. Sometimes she has hoarse voice, cough often. No throat pain. Some mucous production brown in color. No chest pain. Right arm pain with pain in 2-3rd fingers on dialysis side all the time. Does not feel like neuropathy pain. Denies fevers, chills, SOB.

## 2014-08-12 NOTE — Progress Notes (Signed)
Patient ID: Joan Young, female   DOB: 12/03/1960, 54 y.o.   MRN: [id number]  CC: headaches, ear pain  HPI: Joan Young is a 54 y.o. female here today for a follow up visit.  Patient has past medical history of of ESRD on HD, HTN, depression, HLD, T1DM. Patient reports that she has continued to have headaches over the past month. The headaches are described as feeling like her head is underwater. She notes that it is a pressure in her head and in her left ear. She has been taking flexeril for her headaches without relief.  Sometimes she has hoarse voice with a cough. No throat pain or chest pain. Some mucous production brown in color.  Right arm pain that radiates down to her 2-3 fingers, she is concerned because that is the side she has her dialysis graft on. The pain in her fingers does not feel like neuropathy pain. Denies fevers, chills, SOB.   Allergies  Allergen Reactions  . Hydromorphone Itching  . Latex Rash  . Tape Rash   Past Medical History  Diagnosis Date  . Obesity   . Macular degeneration disease   . Depression   . High cholesterol Dx 2005  . Hypertension Dx 7253  . Diabetes mellitus without complication DX 6644    type 30 since 54 years old but reports starting insulin at 54 years old  . Renal disorder   . Dialysis patient    Current Outpatient Prescriptions on File Prior to Visit  Medication Sig Dispense Refill  . acetaminophen (TYLENOL) 325 MG tablet Take 650 mg by mouth every 6 (six) hours as needed (pain).    Marland Kitchen albuterol (PROVENTIL HFA;VENTOLIN HFA) 108 (90 BASE) MCG/ACT inhaler Inhale 2 puffs into the lungs every 6 (six) hours as needed for shortness of breath. And cough 1 Inhaler 2  . aspirin EC 81 MG tablet Take 81 mg by mouth daily.    . B Complex-C-Folic Acid (RENAL) 1 MG CAPS Take by mouth.    . calcium carbonate (TUMS) 500 MG chewable tablet Chew 1 tablet (200 mg of elemental calcium total) by mouth 3 (three) times daily with meals. 90 tablet 2  .  carvedilol (COREG) 25 MG tablet Take 1 tablet (25 mg total) by mouth 2 (two) times daily with a meal. 60 tablet 3  . cyclobenzaprine (FLEXERIL) 5 MG tablet Take 1 tablet (5 mg total) by mouth 3 (three) times daily as needed. For headaches 30 tablet 1  . FLUoxetine (PROZAC) 20 MG capsule Take 1 capsule (20 mg total) by mouth daily. 30 capsule 2  . gabapentin (NEURONTIN) 100 MG capsule Take 1 capsule (100 mg total) by mouth 3 (three) times daily. 180 capsule 0  . glucose blood (FREESTYLE INSULINX TEST) test strip Use as instructed 100 each 12  . glucose blood test strip Use as instructed 3 times daily 100 each 12  . glucose monitoring kit (FREESTYLE) monitoring kit 1 each by Does not apply route as needed for other. E11.9  Test up to 4 times daily 1 each 0  . hydrALAZINE (APRESOLINE) 100 MG tablet TAKE 1 TABLET BY MOUTH 3 TIMES DAILY. 90 tablet 0  . insulin NPH Human (HUMULIN N,NOVOLIN N) 100 UNIT/ML injection Inject 0.08-0.24 mLs (8-24 Units total) into the skin 2 (two) times daily. 8 units in the morning and 24 units at after supper 10 mL 3  . insulin regular (NOVOLIN R,HUMULIN R) 100 units/mL injection Inject 0.1-0.16 mLs (10-16 Units total)  into the skin 3 (three) times daily before meals. 16 units every morning 16 units at lunch 10 units at bedtime.  Per sliding scale 10 mL 3  . Insulin Syringes, Disposable, U-100 0.5 ML MISC Give insulin BID for E10,29 100 each 3  . isosorbide mononitrate (IMDUR) 30 MG 24 hr tablet Take 30 mg by mouth daily.     . Lancets (FREESTYLE) lancets Use as instructed 100 each 12  . omeprazole (PRILOSEC) 20 MG capsule Take 1 capsule (20 mg total) by mouth daily. 30 capsule 2  . pravastatin (PRAVACHOL) 40 MG tablet Take 40 mg by mouth daily.    . ranitidine (ZANTAC) 150 MG tablet Take 150 mg by mouth 2 (two) times daily.    Marland Kitchen zolpidem (AMBIEN) 10 MG tablet Take 1 tablet (10 mg total) by mouth at bedtime as needed for sleep. 30 tablet 0   No current facility-administered  medications on file prior to visit.   Family History  Problem Relation Age of Onset  . Hypertension Father   . Diabetes Father   . Kidney failure Father 20  . Kidney failure Sister 75  . Kidney failure Brother 59  . Kidney failure Sister 67   History   Social History  . Marital Status: Married    Spouse Name: N/A  . Number of Children: N/A  . Years of Education: 6th grade   Occupational History  . homemaker    Social History Main Topics  . Smoking status: Never Smoker   . Smokeless tobacco: Never Used  . Alcohol Use: No  . Drug Use: No  . Sexual Activity: Not on file   Other Topics Concern  . Not on file   Social History Narrative    Review of Systems: See HPI   Objective:   Filed Vitals:   08/12/14 1147  BP: 177/74  Pulse: 72  Temp: 98.3 F (36.8 C)  Resp: 16    Physical Exam  Constitutional: She is oriented to person, place, and time.  Cardiovascular: Normal rate, regular rhythm and normal heart sounds.   Thrill felt on graft site, right upper arm  Pulmonary/Chest: Effort normal and breath sounds normal.  Musculoskeletal: Normal range of motion. She exhibits no edema.  Neurological: She is alert and oriented to person, place, and time.     Lab Results  Component Value Date   WBC 4.8 04/01/2014   HGB 10.0* 04/01/2014   HCT 31.5* 04/01/2014   MCV 90.5 04/01/2014   PLT 145* 04/01/2014   Lab Results  Component Value Date   CREATININE 4.44* 04/01/2014   BUN 25* 04/01/2014   NA 135 04/01/2014   K 3.5 04/01/2014   CL 99 04/01/2014   CO2 29 04/01/2014    Lab Results  Component Value Date   HGBA1C 9.40 07/08/2014   Lipid Panel  No results found for: CHOL, TRIG, HDL, CHOLHDL, VLDL, LDLCALC     Assessment and plan:   Joan Young was seen today for hand pain and ear pain.  Diagnoses and all orders for this visit:  Persistent headaches Orders: -     CT Head Wo Contrast; Future--r/o tumor -     Basic Metabolic Panel -      acetaminophen-codeine (TYLENOL #3) 300-30 MG per tablet; Take 1 tablet by mouth every 8 (eight) hours as needed for moderate pain.---patient unable to tolerate triptans because of ESRD.  Cough Orders: -     CT Chest Wo Contrast; Future -  Basic Metabolic Panel -     benzonatate (TESSALON) 100 MG capsule; Take 1 capsule (100 mg total) by mouth 3 (three) times daily as needed for cough. Chest xray last month was negative. Will get CT to r/o any other process  Tinnitus, left CT scan ordered.   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  Return for August for , DM/HTN.       Chari Manning, NP-C [facility] [phone] 08/12/2014, 5:08 PM

## 2014-08-14 ENCOUNTER — Other Ambulatory Visit: Payer: Self-pay | Admitting: Internal Medicine

## 2014-08-21 ENCOUNTER — Ambulatory Visit (HOSPITAL_COMMUNITY)
Admission: RE | Admit: 2014-08-21 | Discharge: 2014-08-21 | Disposition: A | Discharge status: Home / Self Care | Payer: BLUE CROSS/BLUE SHIELD | Source: Ambulatory Visit | Attending: Internal Medicine | Admitting: Internal Medicine

## 2014-08-21 DIAGNOSIS — R918 Other nonspecific abnormal finding of lung field: Secondary | ICD-10-CM | POA: Insufficient documentation

## 2014-08-21 DIAGNOSIS — R51 Headache: Secondary | ICD-10-CM | POA: Diagnosis not present

## 2014-08-21 DIAGNOSIS — H9319 Tinnitus, unspecified ear: Secondary | ICD-10-CM | POA: Insufficient documentation

## 2014-08-21 DIAGNOSIS — R519 Headache, unspecified: Secondary | ICD-10-CM

## 2014-08-21 DIAGNOSIS — R059 Cough, unspecified: Secondary | ICD-10-CM

## 2014-08-21 DIAGNOSIS — I313 Pericardial effusion (noninflammatory): Secondary | ICD-10-CM | POA: Diagnosis not present

## 2014-08-21 DIAGNOSIS — I700 Atherosclerosis of aorta: Secondary | ICD-10-CM | POA: Diagnosis not present

## 2014-08-21 DIAGNOSIS — R05 Cough: Secondary | ICD-10-CM | POA: Diagnosis not present

## 2014-08-27 ENCOUNTER — Other Ambulatory Visit: Payer: Self-pay

## 2014-08-27 ENCOUNTER — Telehealth: Payer: Self-pay | Admitting: Internal Medicine

## 2014-08-27 ENCOUNTER — Other Ambulatory Visit: Payer: Self-pay | Admitting: Internal Medicine

## 2014-08-27 DIAGNOSIS — B9689 Other specified bacterial agents as the cause of diseases classified elsewhere: Secondary | ICD-10-CM

## 2014-08-27 DIAGNOSIS — J90 Pleural effusion, not elsewhere classified: Secondary | ICD-10-CM

## 2014-08-27 MED ORDER — FLUOXETINE HCL 20 MG PO CAPS: 20.0000 mg | ORAL_CAPSULE | Freq: Every day | ORAL | Status: DC

## 2014-08-27 MED ORDER — GABAPENTIN 100 MG PO CAPS: 100.0000 mg | ORAL_CAPSULE | Freq: Three times a day (TID) | ORAL | Status: DC

## 2014-08-27 MED ORDER — RANITIDINE HCL 150 MG PO TABS: 150.0000 mg | ORAL_TABLET | Freq: Every day | ORAL | Status: DC

## 2014-08-27 NOTE — Telephone Encounter (Signed)
Pt walked in requesting a refill for the following medications. Please follow up with pt. Thank you.   FLUoxetine (PROZAC) 20 MG capsule gabapentin (NEURONTIN) 100 MG capsule ranitidine (ZANTAC) 150 MG tablet

## 2014-08-27 NOTE — Telephone Encounter (Signed)
Hi. Do you want to approve this medicine for patient?

## 2014-08-27 NOTE — Telephone Encounter (Signed)
Call in medicine to pharmacy

## 2014-08-27 NOTE — Telephone Encounter (Signed)
Done

## 2014-08-28 ENCOUNTER — Other Ambulatory Visit: Payer: Self-pay | Admitting: Internal Medicine

## 2014-08-28 DIAGNOSIS — J90 Pleural effusion, not elsewhere classified: Secondary | ICD-10-CM

## 2014-08-28 MED ORDER — AZITHROMYCIN 250 MG PO TABS: ORAL_TABLET | ORAL | Status: DC

## 2014-08-30 MED ORDER — AZITHROMYCIN 250 MG PO TABS: ORAL_TABLET | ORAL | Status: DC

## 2014-08-30 NOTE — Telephone Encounter (Signed)
Nurse called patient, via in house interpreter, Joan Young. Patient verified date of birth. Patient aware of normal head CT scan, chest CT scan shows infection. [facility] [address]  [facility]. Patient agrees to take prescription for 5 days and is aware of infection is causing cough. Patient voices understanding and has no further questions at this time.

## 2014-09-09 ENCOUNTER — Emergency Department (HOSPITAL_COMMUNITY): Payer: BLUE CROSS/BLUE SHIELD

## 2014-09-09 ENCOUNTER — Observation Stay (HOSPITAL_COMMUNITY)
Admission: EM | Admit: 2014-09-09 | Discharge: 2014-09-10 | Disposition: A | Discharge status: Home / Self Care | Payer: BLUE CROSS/BLUE SHIELD | Attending: Family Medicine | Admitting: Family Medicine

## 2014-09-09 ENCOUNTER — Encounter (HOSPITAL_COMMUNITY): Payer: Self-pay | Admitting: Nurse Practitioner

## 2014-09-09 DIAGNOSIS — F329 Major depressive disorder, single episode, unspecified: Secondary | ICD-10-CM | POA: Diagnosis not present

## 2014-09-09 DIAGNOSIS — H3530 Unspecified macular degeneration: Secondary | ICD-10-CM | POA: Insufficient documentation

## 2014-09-09 DIAGNOSIS — E669 Obesity, unspecified: Secondary | ICD-10-CM | POA: Insufficient documentation

## 2014-09-09 DIAGNOSIS — Z7982 Long term (current) use of aspirin: Secondary | ICD-10-CM | POA: Diagnosis not present

## 2014-09-09 DIAGNOSIS — N186 End stage renal disease: Secondary | ICD-10-CM

## 2014-09-09 DIAGNOSIS — I5043 Acute on chronic combined systolic (congestive) and diastolic (congestive) heart failure: Secondary | ICD-10-CM | POA: Diagnosis present

## 2014-09-09 DIAGNOSIS — E1029 Type 1 diabetes mellitus with other diabetic kidney complication: Secondary | ICD-10-CM | POA: Diagnosis present

## 2014-09-09 DIAGNOSIS — Z79899 Other long term (current) drug therapy: Secondary | ICD-10-CM | POA: Diagnosis not present

## 2014-09-09 DIAGNOSIS — E780 Pure hypercholesterolemia: Secondary | ICD-10-CM | POA: Diagnosis not present

## 2014-09-09 DIAGNOSIS — M549 Dorsalgia, unspecified: Secondary | ICD-10-CM | POA: Diagnosis present

## 2014-09-09 DIAGNOSIS — R079 Chest pain, unspecified: Secondary | ICD-10-CM | POA: Diagnosis not present

## 2014-09-09 DIAGNOSIS — E119 Type 2 diabetes mellitus without complications: Secondary | ICD-10-CM | POA: Insufficient documentation

## 2014-09-09 DIAGNOSIS — Z794 Long term (current) use of insulin: Secondary | ICD-10-CM | POA: Diagnosis not present

## 2014-09-09 DIAGNOSIS — I10 Essential (primary) hypertension: Secondary | ICD-10-CM | POA: Insufficient documentation

## 2014-09-09 DIAGNOSIS — IMO0002 Reserved for concepts with insufficient information to code with codable children: Secondary | ICD-10-CM | POA: Diagnosis present

## 2014-09-09 DIAGNOSIS — Z992 Dependence on renal dialysis: Secondary | ICD-10-CM | POA: Diagnosis not present

## 2014-09-09 DIAGNOSIS — N289 Disorder of kidney and ureter, unspecified: Secondary | ICD-10-CM | POA: Diagnosis not present

## 2014-09-09 DIAGNOSIS — E1065 Type 1 diabetes mellitus with hyperglycemia: Secondary | ICD-10-CM

## 2014-09-09 DIAGNOSIS — R2240 Localized swelling, mass and lump, unspecified lower limb: Secondary | ICD-10-CM | POA: Insufficient documentation

## 2014-09-09 LAB — CBC
HEMATOCRIT: 31.6 % — AB (ref 36.0–46.0)
HEMOGLOBIN: 10.5 g/dL — AB (ref 12.0–15.0)
MCH: 31.1 pg (ref 26.0–34.0)
MCHC: 33.2 g/dL (ref 30.0–36.0)
MCV: 93.5 fL (ref 78.0–100.0)
PLATELETS: 139 10*3/uL — AB (ref 150–400)
RBC: 3.38 MIL/uL — AB (ref 3.87–5.11)
RDW: 13.3 % (ref 11.5–15.5)
WBC: 5.3 10*3/uL (ref 4.0–10.5)

## 2014-09-09 LAB — I-STAT TROPONIN, ED: TROPONIN I, POC: 0.07 ng/mL (ref 0.00–0.08)

## 2014-09-09 LAB — BASIC METABOLIC PANEL
Anion gap: 11 (ref 5–15)
BUN: 46 mg/dL — ABNORMAL HIGH (ref 6–20)
CO2: 24 mmol/L (ref 22–32)
CREATININE: 6.64 mg/dL — AB (ref 0.44–1.00)
Calcium: 9.2 mg/dL (ref 8.9–10.3)
Chloride: 98 mmol/L — ABNORMAL LOW (ref 101–111)
GFR calc Af Amer: 7 mL/min — ABNORMAL LOW (ref >60)
GFR, EST NON AFRICAN AMERICAN: 6 mL/min — AB (ref >60)
GLUCOSE: 408 mg/dL — AB (ref 65–99)
POTASSIUM: 5.5 mmol/L — AB (ref 3.5–5.1)
Sodium: 133 mmol/L — ABNORMAL LOW (ref 135–145)

## 2014-09-09 LAB — GLUCOSE, CAPILLARY: Glucose-Capillary: 329 mg/dL — ABNORMAL HIGH (ref 65–99)

## 2014-09-09 MED ORDER — GABAPENTIN 100 MG PO CAPS
100.0000 mg | ORAL_CAPSULE | Freq: Three times a day (TID) | ORAL | Status: DC
  Administered 2014-09-10 (×2): 100 mg via ORAL
  Filled 2014-09-09 (×2): qty 1

## 2014-09-09 MED ORDER — ASPIRIN EC 81 MG PO TBEC
81.0000 mg | DELAYED_RELEASE_TABLET | Freq: Every day | ORAL | Status: DC
  Administered 2014-09-10: 81 mg via ORAL
  Filled 2014-09-09: qty 1

## 2014-09-09 MED ORDER — ONDANSETRON HCL 4 MG/2ML IJ SOLN: 4.0000 mg | Freq: Four times a day (QID) | INTRAMUSCULAR | Status: DC | PRN

## 2014-09-09 MED ORDER — MORPHINE SULFATE 2 MG/ML IJ SOLN: 2.0000 mg | INTRAMUSCULAR | Status: DC | PRN

## 2014-09-09 MED ORDER — CARVEDILOL 25 MG PO TABS: 25.0000 mg | ORAL_TABLET | Freq: Two times a day (BID) | ORAL | Status: DC

## 2014-09-09 MED ORDER — CYCLOBENZAPRINE HCL 10 MG PO TABS: 5.0000 mg | ORAL_TABLET | Freq: Three times a day (TID) | ORAL | Status: DC | PRN

## 2014-09-09 MED ORDER — HYDRALAZINE HCL 50 MG PO TABS
100.0000 mg | ORAL_TABLET | Freq: Three times a day (TID) | ORAL | Status: DC
  Administered 2014-09-10 (×2): 100 mg via ORAL
  Filled 2014-09-09 (×6): qty 2

## 2014-09-09 MED ORDER — ZOLPIDEM TARTRATE 5 MG PO TABS: 5.0000 mg | ORAL_TABLET | Freq: Every evening | ORAL | Status: DC | PRN

## 2014-09-09 MED ORDER — ACETAMINOPHEN-CODEINE #3 300-30 MG PO TABS: 1.0000 | ORAL_TABLET | Freq: Three times a day (TID) | ORAL | Status: DC | PRN

## 2014-09-09 MED ORDER — ALBUTEROL SULFATE (2.5 MG/3ML) 0.083% IN NEBU: 3.0000 mL | INHALATION_SOLUTION | Freq: Four times a day (QID) | RESPIRATORY_TRACT | Status: DC | PRN

## 2014-09-09 MED ORDER — SODIUM POLYSTYRENE SULFONATE 15 GM/60ML PO SUSP: 15.0000 g | Freq: Once | ORAL | Status: DC

## 2014-09-09 MED ORDER — BENZONATATE 100 MG PO CAPS: 100.0000 mg | ORAL_CAPSULE | Freq: Three times a day (TID) | ORAL | Status: DC | PRN

## 2014-09-09 MED ORDER — PANTOPRAZOLE SODIUM 40 MG PO TBEC
40.0000 mg | DELAYED_RELEASE_TABLET | Freq: Every day | ORAL | Status: DC
  Administered 2014-09-10: 40 mg via ORAL
  Filled 2014-09-09: qty 1

## 2014-09-09 MED ORDER — FLUOXETINE HCL 20 MG PO CAPS
20.0000 mg | ORAL_CAPSULE | Freq: Every day | ORAL | Status: DC
  Administered 2014-09-10: 20 mg via ORAL
  Filled 2014-09-09: qty 1

## 2014-09-09 MED ORDER — PRAVASTATIN SODIUM 40 MG PO TABS
40.0000 mg | ORAL_TABLET | Freq: Every day | ORAL | Status: DC
  Administered 2014-09-10: 40 mg via ORAL
  Filled 2014-09-09: qty 1

## 2014-09-09 MED ORDER — GI COCKTAIL ~~LOC~~: 30.0000 mL | Freq: Four times a day (QID) | ORAL | Status: DC | PRN

## 2014-09-09 MED ORDER — FAMOTIDINE 20 MG PO TABS
20.0000 mg | ORAL_TABLET | Freq: Every day | ORAL | Status: DC
  Administered 2014-09-10: 20 mg via ORAL
  Filled 2014-09-09: qty 1

## 2014-09-09 MED ORDER — HEPARIN SODIUM (PORCINE) 5000 UNIT/ML IJ SOLN
5000.0000 [IU] | Freq: Three times a day (TID) | INTRAMUSCULAR | Status: DC
  Administered 2014-09-10 (×2): 5000 [IU] via SUBCUTANEOUS
  Filled 2014-09-09 (×2): qty 1

## 2014-09-09 MED ORDER — NITROGLYCERIN 0.4 MG SL SUBL
0.4000 mg | SUBLINGUAL_TABLET | Freq: Once | SUBLINGUAL | Status: AC
  Administered 2014-09-09: 0.4 mg via SUBLINGUAL

## 2014-09-09 MED ORDER — INSULIN ASPART 100 UNIT/ML ~~LOC~~ SOLN
0.0000 [IU] | SUBCUTANEOUS | Status: DC
  Administered 2014-09-10: 5 [IU] via SUBCUTANEOUS
  Administered 2014-09-10: 11 [IU] via SUBCUTANEOUS

## 2014-09-09 MED ORDER — ACETAMINOPHEN 325 MG PO TABS
650.0000 mg | ORAL_TABLET | ORAL | Status: DC | PRN
  Administered 2014-09-10: 650 mg via ORAL
  Filled 2014-09-09: qty 2

## 2014-09-09 MED ORDER — INSULIN NPH (HUMAN) (ISOPHANE) 100 UNIT/ML ~~LOC~~ SUSP
24.0000 [IU] | Freq: Every day | SUBCUTANEOUS | Status: DC
  Administered 2014-09-10: 24 [IU] via SUBCUTANEOUS
  Filled 2014-09-09: qty 10

## 2014-09-09 MED ORDER — ISOSORBIDE MONONITRATE ER 30 MG PO TB24: 30.0000 mg | ORAL_TABLET | Freq: Every day | ORAL | Status: DC

## 2014-09-09 MED ORDER — INSULIN NPH (HUMAN) (ISOPHANE) 100 UNIT/ML ~~LOC~~ SUSP
8.0000 [IU] | Freq: Every day | SUBCUTANEOUS | Status: DC
  Filled 2014-09-09: qty 10

## 2014-09-09 MED ORDER — CALCIUM CARBONATE ANTACID 500 MG PO CHEW
1.0000 | CHEWABLE_TABLET | Freq: Three times a day (TID) | ORAL | Status: DC
  Administered 2014-09-10: 200 mg via ORAL
  Filled 2014-09-09: qty 1

## 2014-09-09 NOTE — H&P (Signed)
[facility] History and Physical  Joan Young WUJ:[id number] DOB: 07-04-1960 DOA: 09/09/2014  Referring physician: Benjiman CoreNathan Pickering, MD PCP: Ambrose FinlandValerie A Keck, NP   Chief Complaint: Back/Chest Pain  HPI: Joan Young is a 54 y.o. female with history of CAD CHF DM ESRD presents with chest pain back pain and pressure. Her daughter states that this has been going on for more than a month. Pain is constant appears to wax and wanwe in severity. Patient has associated shortness of breath for the first time today. She has dizziness noted. No syncope noted. She has no nausea and vomiting. She has had a "weak balance". She states that she also has been having earaches on occasion. Apparently the patient has a know history of CAD which was [facility]. In the ED she received NTG SL which relieved the pain.   Review of Systems:  Systems reviewed and is unremarkable other than HPI  Past Medical History  Diagnosis Date  . Obesity   . Macular degeneration disease   . Depression   . High cholesterol Dx 2005  . Hypertension Dx [id number]  . Diabetes mellitus without complication DX 1969    type 512 since 54 years old but reports starting insulin at [age] years old  . Renal disorder   . Dialysis patient    Past Surgical History  Procedure Laterality Date  . Av fistula placement Left 12/19/2013    Procedure: Creation of Left Arm BRACHIOCEPHALIC ARTERIOVENOUS FISTULA ;  Surgeon: Fransisco HertzBrian L Chen, MD;  [facility];  Service: Vascular;  Laterality: Left;   Social History:  reports that she has never smoked. She has never used smokeless tobacco. She reports that she does not drink alcohol or use illicit drugs.  Allergies  Allergen Reactions  . Hydromorphone Itching  . Latex Rash  . Tape Rash    Family History  Problem Relation Age of Onset  . Hypertension Father   . Diabetes Father   . Kidney failure Father 2664  . Kidney failure Sister 2950  . Kidney failure Brother 30  .  Kidney failure Sister [id number]     Prior to Admission medications   Medication Sig Start Date End Date Taking? Authorizing Provider  acetaminophen (TYLENOL) 325 MG tablet Take 650 mg by mouth every 6 (six) hours as needed (pain).    Historical Provider, MD  acetaminophen-codeine (TYLENOL #3) 300-30 MG per tablet Take 1 tablet by mouth every 8 (eight) hours as needed for moderate pain. 08/12/14   Ambrose FinlandValerie A Keck, NP  albuterol (PROVENTIL HFA;VENTOLIN HFA) 108 (90 BASE) MCG/ACT inhaler Inhale 2 puffs into the lungs every 6 (six) hours as needed for shortness of breath. And cough 07/08/14   Ambrose FinlandValerie A Keck, NP  aspirin EC 81 MG tablet Take 81 mg by mouth daily.    Historical Provider, MD  B Complex-C-Folic Acid (RENAL) 1 MG CAPS Take by mouth.    Historical Provider, MD  benzonatate (TESSALON) 100 MG capsule Take 1 capsule (100 mg total) by mouth 3 (three) times daily as needed for cough. 08/12/14   Ambrose FinlandValerie A Keck, NP  calcium carbonate (TUMS) 500 MG chewable tablet Chew 1 tablet (200 mg of elemental calcium total) by mouth 3 (three) times daily with meals. 12/20/13   Rushil Terrilee CroakPatel V, MD  carvedilol (COREG) 25 MG tablet Take 1 tablet (25 mg total) by mouth 2 (two) times daily with a meal. 05/01/14   Ambrose FinlandValerie A Keck, NP  cyclobenzaprine (FLEXERIL) 5 MG  tablet Take 1 tablet (5 mg total) by mouth 3 (three) times daily as needed. For headaches 07/08/14   Ambrose Finland, NP  FLUoxetine (PROZAC) 20 MG capsule Take 1 capsule (20 mg total) by mouth daily. 08/27/14   Ambrose Finland, NP  gabapentin (NEURONTIN) 100 MG capsule Take 1 capsule (100 mg total) by mouth 3 (three) times daily. 08/27/14   Ambrose Finland, NP  hydrALAZINE (APRESOLINE) 100 MG tablet TAKE 1 TABLET BY MOUTH 3 TIMES DAILY. 06/28/14   Ambrose Finland, NP  insulin NPH Human (HUMULIN N,NOVOLIN N) 100 UNIT/ML injection Inject 0.08-0.24 mLs (8-24 Units total) into the skin 2 (two) times daily. 8 units in the morning and 24 units at after supper 05/01/14   Ambrose Finland,  NP  insulin regular (NOVOLIN R,HUMULIN R) 100 units/mL injection Inject 0.1-0.16 mLs (10-16 Units total) into the skin 3 (three) times daily before meals. 16 units every morning 16 units at lunch 10 units at bedtime.  Per sliding scale 05/01/14   Ambrose Finland, NP  isosorbide mononitrate (IMDUR) 30 MG 24 hr tablet Take 30 mg by mouth daily.  03/15/14   Historical Provider, MD  omeprazole (PRILOSEC) 20 MG capsule Take 1 capsule (20 mg total) by mouth daily. 05/01/14   Ambrose Finland, NP  pravastatin (PRAVACHOL) 40 MG tablet Take 40 mg by mouth daily.    Historical Provider, MD  ranitidine (ZANTAC) 150 MG tablet Take 1 tablet (150 mg total) by mouth daily. 08/27/14   Ambrose Finland, NP  zolpidem (AMBIEN) 10 MG tablet Take 1 tablet (10 mg total) by mouth at bedtime as needed for sleep. 05/01/14   Ambrose Finland, NP   Physical Exam: Filed Vitals:   09/09/14 1943 09/09/14 2045 09/09/14 2100 09/09/14 2130  BP: 192/66 193/68 174/73 180/70  Pulse: 75 78 76 76  Temp: 98.5 F (36.9 C)     TempSrc: Oral     Resp: 15 20 20 14   Height:      Weight:      SpO2: 100% 100% 96% 98%    Wt Readings from Last 3 Encounters:  09/09/14 83.144 kg (183 lb 4.8 oz)  08/12/14 82.101 kg (181 lb)  07/08/14 82.101 kg (181 lb)    General:  Appears calm and comfortable Eyes: PERRL, normal lids, irises & conjunctiva ENT: grossly normal hearing, lips & tongue Neck: no LAD, masses or thyromegaly Cardiovascular: RRR, no m/r/g. No LE edema. Respiratory: CTA bilaterally, no w/r/r Abdomen: soft, ntnd Skin: no rash or induration seen on limited exam Musculoskeletal: grossly normal tone BUE/BLE Psychiatric: grossly normal mood and affect Neurologic: grossly non-focal.          Labs on Admission:  Basic Metabolic Panel:  Recent Labs Lab 09/09/14 1816  NA 133*  K 5.5*  CL 98*  CO2 24  GLUCOSE 408*  BUN 46*  CREATININE 6.64*  CALCIUM 9.2   Liver Function Tests: No results for input(s): AST, ALT, ALKPHOS,  BILITOT, PROT, ALBUMIN in the last 168 hours. No results for input(s): LIPASE, AMYLASE in the last 168 hours. No results for input(s): AMMONIA in the last 168 hours. CBC:  Recent Labs Lab 09/09/14 1816  WBC 5.3  HGB 10.5*  HCT 31.6*  MCV 93.5  PLT 139*   Cardiac Enzymes: No results for input(s): CKTOTAL, CKMB, CKMBINDEX, TROPONINI in the last 168 hours.  BNP (last 3 results)  Recent Labs  03/31/14 0410  BNP 2127.3*    ProBNP (  last 3 results)  Recent Labs  12/12/13 1351  PROBNP 12460.0*    CBG: No results for input(s): GLUCAP in the last 168 hours.  Radiological Exams on Admission: Dg Chest 2 View  09/09/2014   CLINICAL DATA:  54 year old female with history of left-sided chest pain, shoulder and neck pain for the past 2 days. Shortness of breath.  EXAM: CHEST  2 VIEW  COMPARISON:  Chest x-ray 07/31/2014.  FINDINGS: Lung volumes are normal. No consolidative airspace disease. No pleural effusions. Mild cephalization of the pulmonary vasculature, with mild diffuse peribronchial cuffing and slight indistinctness of interstitial markings. Mild cardiomegaly. Upper mediastinal contours are within normal limits. Atherosclerosis in the thoracic aorta.  IMPRESSION: 1. The appearance the chest suggests very mild congestive heart failure, as above.   Electronically Signed   By: Trudie Reed M.D.   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 Chest Pain -now pain free -will admit to telemetry -will check serial enzymes -echo in am  2. DM Type I with Renal manifestations -will check A1C -Continue NPH and SSI coverage  3. Peripheral Neuropathy -will be continued on gabapentin  4. Chronic Systolic and Diastolic Heart  Failure -will continue with coreg  5. HTN -continue with hydralizine and Coreg  6. Hyperkalemia -will repeat labs in am -Kayexalate one time dose  7. H/o CAD -will continue with Coreg -continue with Imdur -continue with Pravachol  8. ESRD On HD -on HD will continue  9. GERD -will be continue on Pepcid and Omeprazole  10. Depression -will continue with Prozac  11. Anemia -related to Renal Failure -will repeat labs in am     Code Status: FUll Code (must indicate code status--if unknown or must be presumed, indicate so) DVT Prophylaxis:Heparin Family Communication: none (indicate person spoken with, if applicable, with phone number if by telephone) Disposition Plan: Home (indicate anticipated LOS)  Time spent: obs  [facility] Pager [phone]

## 2014-09-09 NOTE — ED Notes (Signed)
Danielle RN at bedside attempting IV start

## 2014-09-09 NOTE — ED Provider Notes (Signed)
CSN: [id number]     Arrival date & time 09/09/14  1706 History   First MD Initiated Contact with Patient 09/09/14 1949     Chief Complaint  Patient presents with  . Back Pain     (Consider location/radiation/quality/duration/timing/severity/associated sxs/prior Treatment) The history is provided by the patient.   patient presents with left ear pain and back/chest pain. Left ear pain has been going on for a month. She has decreased hearing out of here. States she seen her primary care doctor was told nothing wrong with it. States it hurts. It comes and go somewhat. He also states she has some feeling the room spins at times. No headache. No confusion. Had a head CT without a clear cause. Patient also has chest pain. She's had it on and off for a month. States dispense a constant but comes and goes. It has been more frequent now. Started with pain in both banks and a cough chest CT showed pneumonia on the right side she had antibody eczema cough resolved but now the pain is only on the left side. Wax and wane somewhat but has been getting more severe recently. It is on the back and radiates to the front of the chest. No shortness breath. Not associated with exertion. She had a heart cath back in February showed an a chronically occluded RCA and mild to moderate disease in other vessels. It was done at [facility]. States she does feel pressure on her chest but has been somewhat constantly. No diaphoresis. No nausea.  Past Medical History  Diagnosis Date  . Obesity   . Macular degeneration disease   . Depression   . High cholesterol Dx 2005  . Hypertension Dx 4098  . Diabetes mellitus without complication DX 1969    type 63 since 54 years old but reports starting insulin at 54 years old  . Renal disorder   . Dialysis patient    Past Surgical History  Procedure Laterality Date  . Av fistula placement Left 12/19/2013    Procedure: Creation of Left Arm BRACHIOCEPHALIC ARTERIOVENOUS FISTULA  ;  Surgeon: Fransisco Hertz, MD;  Location: [facility] OR;  Service: Vascular;  Laterality: Left;   Family History  Problem Relation Age of Onset  . Hypertension Father   . Diabetes Father   . Kidney failure Father 2  . Kidney failure Sister 71  . Kidney failure Brother 30  . Kidney failure Sister 70   History  Substance Use Topics  . Smoking status: Never Smoker   . Smokeless tobacco: Never Used  . Alcohol Use: No   OB History    No data available     Review of Systems  Constitutional: Negative for activity change and appetite change.  HENT:       Decreased hearing out of her left ear. Some vertigo.  Eyes: Negative for pain.  Respiratory: Negative for chest tightness and shortness of breath.   Cardiovascular: Positive for chest pain and leg swelling.  Gastrointestinal: Negative for nausea, vomiting, abdominal pain and diarrhea.  Genitourinary: Negative for flank pain.  Musculoskeletal: Positive for back pain. Negative for neck stiffness.  Skin: Negative for rash.  Neurological: Negative for weakness, numbness and headaches.  Psychiatric/Behavioral: Negative for behavioral problems.      Allergies  Hydromorphone and Tape  Home Medications   Prior to Admission medications   Medication Sig Start Date End Date Taking? Authorizing Provider  acetaminophen (TYLENOL) 325 MG tablet Take 650 mg by mouth every 6 (  six) hours as needed (pain).   Yes Historical Provider, MD  albuterol (PROVENTIL HFA;VENTOLIN HFA) 108 (90 BASE) MCG/ACT inhaler Inhale 2 puffs into the lungs every 6 (six) hours as needed for shortness of breath. And cough 07/08/14  Yes Ambrose Finland, NP  aspirin EC 81 MG tablet Take 81 mg by mouth daily.   Yes Historical Provider, MD  carvedilol (COREG) 25 MG tablet Take 1 tablet (25 mg total) by mouth 2 (two) times daily with a meal. 05/01/14  Yes Ambrose Finland, NP  cyclobenzaprine (FLEXERIL) 5 MG tablet Take 1 tablet (5 mg total) by mouth 3 (three) times daily as needed. For  headaches 07/08/14  Yes Ambrose Finland, NP  FLUoxetine (PROZAC) 20 MG capsule Take 1 capsule (20 mg total) by mouth daily. 08/27/14  Yes Ambrose Finland, NP  gabapentin (NEURONTIN) 100 MG capsule Take 1 capsule (100 mg total) by mouth 3 (three) times daily. 08/27/14  Yes Ambrose Finland, NP  hydrALAZINE (APRESOLINE) 100 MG tablet TAKE 1 TABLET BY MOUTH 3 TIMES DAILY. 06/28/14  Yes Ambrose Finland, NP  insulin NPH Human (HUMULIN N,NOVOLIN N) 100 UNIT/ML injection Inject 0.08-0.24 mLs (8-24 Units total) into the skin 2 (two) times daily. 8 units in the morning and 24 units at after supper 05/01/14  Yes Ambrose Finland, NP  insulin regular (NOVOLIN R,HUMULIN R) 100 units/mL injection Inject 0.1-0.16 mLs (10-16 Units total) into the skin 3 (three) times daily before meals. 16 units every morning 16 units at lunch 10 units at bedtime.  Per sliding scale 05/01/14  Yes Ambrose Finland, NP  isosorbide mononitrate (IMDUR) 30 MG 24 hr tablet Take 30 mg by mouth daily.  03/15/14  Yes Historical Provider, MD  omeprazole (PRILOSEC) 20 MG capsule Take 1 capsule (20 mg total) by mouth daily. 05/01/14  Yes Ambrose Finland, NP  pravastatin (PRAVACHOL) 40 MG tablet Take 40 mg by mouth daily.   Yes Historical Provider, MD  ranitidine (ZANTAC) 150 MG tablet Take 1 tablet (150 mg total) by mouth daily. Patient taking differently: Take 150 mg by mouth 2 (two) times daily.  08/27/14  Yes Ambrose Finland, NP   BP 181/79 mmHg  Pulse 75  Temp(Src) 98.2 F (36.8 C) (Oral)  Resp 18  Ht 5' (1.524 m)  Wt 183 lb 6.4 oz (83.19 kg)  BMI 35.82 kg/m2  SpO2 96% Physical Exam  Constitutional: She is oriented to person, place, and time. She appears well-developed and well-nourished.  HENT:  Head: Normocephalic and atraumatic.  Bilateral TMs normal.  Eyes: EOM are normal. Pupils are equal, round, and reactive to light.  Neck: Normal range of motion. Neck supple.  Cardiovascular: Normal rate, regular rhythm and normal heart sounds.   No murmur  heard. Pulmonary/Chest: Effort normal and breath sounds normal. No respiratory distress. She has no wheezes. She has no rales.  Abdominal: Soft. Bowel sounds are normal. She exhibits no distension. There is no tenderness. There is no rebound and no guarding.  Musculoskeletal: Normal range of motion.  Neurological: She is alert and oriented to person, place, and time. No cranial nerve deficit.  Skin: Skin is warm and dry.  Psychiatric: She has a normal mood and affect. Her speech is normal.  Nursing note and vitals reviewed.   ED Course  Procedures (including critical care time) Labs Review Labs Reviewed  BASIC METABOLIC PANEL - Abnormal; Notable for the following:    Sodium 133 (*)    Potassium  5.5 (*)    Chloride 98 (*)    Glucose, Bld 408 (*)    BUN 46 (*)    Creatinine, Ser 6.64 (*)    GFR calc non Af Amer 6 (*)    GFR calc Af Amer 7 (*)    All other components within normal limits  CBC - Abnormal; Notable for the following:    RBC 3.38 (*)    Hemoglobin 10.5 (*)    HCT 31.6 (*)    Platelets 139 (*)    All other components within normal limits  GLUCOSE, CAPILLARY - Abnormal; Notable for the following:    Glucose-Capillary 329 (*)    All other components within normal limits  HEMOGLOBIN A1C  TROPONIN I  TROPONIN I  TROPONIN I  CBC  CREATININE, SERUM  I-STAT TROPOININ, ED    Imaging Review Dg Chest 2 View  09/09/2014   CLINICAL DATA:  54 year old female with history of left-sided chest pain, shoulder and neck pain for the past 2 days. Shortness of breath.  EXAM: CHEST  2 VIEW  COMPARISON:  Chest x-ray 07/31/2014.  FINDINGS: Lung volumes are normal. No consolidative airspace disease. No pleural effusions. Mild cephalization of the pulmonary vasculature, with mild diffuse peribronchial cuffing and slight indistinctness of interstitial markings. Mild cardiomegaly. Upper mediastinal contours are within normal limits. Atherosclerosis in the thoracic aorta.  IMPRESSION: 1. The  appearance the chest suggests very mild congestive heart failure, as above.   Electronically Signed   By: Trudie Reedaniel  Entrikin M.D.   On: 09/09/2014 18:52     EKG Interpretation None     ED ECG REPORT   Date: 09/09/2014  Rate: 75  Rhythm: normal sinus rhythm  QRS Axis: normal  Intervals: normal  ST/T Wave abnormalities: normal  Conduction Disutrbances:none  Narrative Interpretation:   Old EKG Reviewed: none available   MDM   Final diagnoses:  None    Patient with chest pain. On the left side of her chest. Is on the back also. EKG reassuring but has cardiac history including moderate lesions on rather recent cath. Will admit to internal medicine for rule out.    Benjiman CoreNathan Ura Hausen, MD 09/09/14 [phone]

## 2014-09-09 NOTE — ED Notes (Addendum)
She c/o mid/upper back pain onset 2 days ago. The family is concerned because she recently had pneumonia that she has been taking oral antibiotics for. She denies cough, fevers. She states sometimes the pain radiates into her chest. pain increased/relieved by nothing.  She tried otc pain meds. She is A&ox4, resp e/u. She c/o L ear pain and decreased hearing  x 1 month also. She is a dialysis patient, received complete dialysis saturday

## 2014-09-09 NOTE — ED Notes (Signed)
Pt reports no CP/chest pressure after admin of NTG. Pt also reports back pain has improved

## 2014-09-09 NOTE — ED Notes (Signed)
IV attempt X2 

## 2014-09-10 DIAGNOSIS — E1029 Type 1 diabetes mellitus with other diabetic kidney complication: Secondary | ICD-10-CM

## 2014-09-10 DIAGNOSIS — N186 End stage renal disease: Secondary | ICD-10-CM | POA: Diagnosis not present

## 2014-09-10 DIAGNOSIS — I10 Essential (primary) hypertension: Secondary | ICD-10-CM

## 2014-09-10 DIAGNOSIS — I208 Other forms of angina pectoris: Secondary | ICD-10-CM

## 2014-09-10 DIAGNOSIS — I5043 Acute on chronic combined systolic (congestive) and diastolic (congestive) heart failure: Secondary | ICD-10-CM

## 2014-09-10 DIAGNOSIS — Z992 Dependence on renal dialysis: Secondary | ICD-10-CM

## 2014-09-10 DIAGNOSIS — R0789 Other chest pain: Secondary | ICD-10-CM

## 2014-09-10 LAB — CBC
HEMATOCRIT: 28 % — AB (ref 36.0–46.0)
Hemoglobin: 9.4 g/dL — ABNORMAL LOW (ref 12.0–15.0)
MCH: 31.2 pg (ref 26.0–34.0)
MCHC: 33.6 g/dL (ref 30.0–36.0)
MCV: 93 fL (ref 78.0–100.0)
Platelets: 130 10*3/uL — ABNORMAL LOW (ref 150–400)
RBC: 3.01 MIL/uL — ABNORMAL LOW (ref 3.87–5.11)
RDW: 13.4 % (ref 11.5–15.5)
WBC: 4.8 10*3/uL (ref 4.0–10.5)

## 2014-09-10 LAB — RENAL FUNCTION PANEL
Albumin: 3 g/dL — ABNORMAL LOW (ref 3.5–5.0)
Anion gap: 11 (ref 5–15)
BUN: 54 mg/dL — ABNORMAL HIGH (ref 6–20)
CO2: 23 mmol/L (ref 22–32)
Calcium: 8.6 mg/dL — ABNORMAL LOW (ref 8.9–10.3)
Chloride: 96 mmol/L — ABNORMAL LOW (ref 101–111)
Creatinine, Ser: 7.32 mg/dL — ABNORMAL HIGH (ref 0.44–1.00)
GFR calc non Af Amer: 6 mL/min — ABNORMAL LOW (ref >60)
GFR, EST AFRICAN AMERICAN: 7 mL/min — AB (ref >60)
GLUCOSE: 379 mg/dL — AB (ref 65–99)
PHOSPHORUS: 6.7 mg/dL — AB (ref 2.5–4.6)
POTASSIUM: 5.7 mmol/L — AB (ref 3.5–5.1)
Sodium: 130 mmol/L — ABNORMAL LOW (ref 135–145)

## 2014-09-10 LAB — CBC WITH DIFFERENTIAL/PLATELET
BASOS ABS: 0 10*3/uL (ref 0.0–0.1)
BASOS PCT: 0 % (ref 0–1)
Eosinophils Absolute: 0.2 10*3/uL (ref 0.0–0.7)
Eosinophils Relative: 4 % (ref 0–5)
HCT: 27.3 % — ABNORMAL LOW (ref 36.0–46.0)
HEMOGLOBIN: 9.1 g/dL — AB (ref 12.0–15.0)
LYMPHS PCT: 14 % (ref 12–46)
Lymphs Abs: 0.7 10*3/uL (ref 0.7–4.0)
MCH: 31.4 pg (ref 26.0–34.0)
MCHC: 33.3 g/dL (ref 30.0–36.0)
MCV: 94.1 fL (ref 78.0–100.0)
MONOS PCT: 6 % (ref 3–12)
Monocytes Absolute: 0.3 10*3/uL (ref 0.1–1.0)
NEUTROS ABS: 3.9 10*3/uL (ref 1.7–7.7)
NEUTROS PCT: 76 % (ref 43–77)
Platelets: 134 10*3/uL — ABNORMAL LOW (ref 150–400)
RBC: 2.9 MIL/uL — ABNORMAL LOW (ref 3.87–5.11)
RDW: 13.4 % (ref 11.5–15.5)
WBC: 5 10*3/uL (ref 4.0–10.5)

## 2014-09-10 LAB — GLUCOSE, CAPILLARY: Glucose-Capillary: 129 mg/dL — ABNORMAL HIGH (ref 65–99)

## 2014-09-10 LAB — CREATININE, SERUM
Creatinine, Ser: 6.9 mg/dL — ABNORMAL HIGH (ref 0.44–1.00)
GFR calc Af Amer: 7 mL/min — ABNORMAL LOW (ref >60)
GFR, EST NON AFRICAN AMERICAN: 6 mL/min — AB (ref >60)

## 2014-09-10 LAB — TROPONIN I
TROPONIN I: 0.07 ng/mL — AB (ref <0.031)
TROPONIN I: 0.07 ng/mL — AB (ref <0.031)
Troponin I: 0.08 ng/mL — ABNORMAL HIGH (ref <0.031)

## 2014-09-10 MED ORDER — ISOSORBIDE MONONITRATE ER 60 MG PO TB24: 60.0000 mg | ORAL_TABLET | Freq: Every day | ORAL | Status: DC

## 2014-09-10 MED ORDER — LIDOCAINE-PRILOCAINE 2.5-2.5 % EX CREA
1.0000 "application " | TOPICAL_CREAM | CUTANEOUS | Status: DC | PRN
  Filled 2014-09-10: qty 5

## 2014-09-10 MED ORDER — ALTEPLASE 2 MG IJ SOLR
2.0000 mg | Freq: Once | INTRAMUSCULAR | Status: DC | PRN
  Filled 2014-09-10: qty 2

## 2014-09-10 MED ORDER — ISOSORBIDE MONONITRATE ER 60 MG PO TB24
60.0000 mg | ORAL_TABLET | Freq: Every day | ORAL | Status: DC
  Administered 2014-09-10: 60 mg via ORAL
  Filled 2014-09-10: qty 1

## 2014-09-10 MED ORDER — NITROGLYCERIN 0.4 MG SL SUBL: 0.4000 mg | SUBLINGUAL_TABLET | SUBLINGUAL | Status: AC | PRN

## 2014-09-10 MED ORDER — NITROGLYCERIN 0.4 MG SL SUBL
SUBLINGUAL_TABLET | SUBLINGUAL | Status: AC
  Administered 2014-09-10: 0.4 mg
  Filled 2014-09-10: qty 1

## 2014-09-10 MED ORDER — SODIUM CHLORIDE 0.9 % IV SOLN: 100.0000 mL | INTRAVENOUS | Status: DC | PRN

## 2014-09-10 MED ORDER — ISOSORBIDE MONONITRATE ER 30 MG PO TB24: 30.0000 mg | ORAL_TABLET | Freq: Every day | ORAL | Status: DC

## 2014-09-10 MED ORDER — DEXTROSE 50 % IV SOLN
INTRAVENOUS | Status: AC
  Administered 2014-09-10: 50 mL
  Filled 2014-09-10: qty 50

## 2014-09-10 MED ORDER — NITROGLYCERIN 0.4 MG SL SUBL: 0.4000 mg | SUBLINGUAL_TABLET | SUBLINGUAL | Status: DC | PRN

## 2014-09-10 MED ORDER — HEPARIN SODIUM (PORCINE) 1000 UNIT/ML DIALYSIS: 3500.0000 [IU] | Freq: Once | INTRAMUSCULAR | Status: DC

## 2014-09-10 MED ORDER — HEPARIN SODIUM (PORCINE) 1000 UNIT/ML DIALYSIS: 1000.0000 [IU] | INTRAMUSCULAR | Status: DC | PRN

## 2014-09-10 MED ORDER — PENTAFLUOROPROP-TETRAFLUOROETH EX AERO: 1.0000 "application " | INHALATION_SPRAY | CUTANEOUS | Status: DC | PRN

## 2014-09-10 MED ORDER — NEPRO/CARBSTEADY PO LIQD
237.0000 mL | ORAL | Status: DC | PRN
  Filled 2014-09-10: qty 237

## 2014-09-10 MED ORDER — LIDOCAINE HCL (PF) 1 % IJ SOLN: 5.0000 mL | INTRAMUSCULAR | Status: DC | PRN

## 2014-09-10 NOTE — Procedures (Signed)
Patient was seen on dialysis and the procedure was supervised. BFR 400 Via RU AVF BP is 191/76.  Patient appears to be tolerating treatment well

## 2014-09-10 NOTE — Consult Note (Signed)
[facility] Renal Consultation Note    Indication for Consultation:  Management of ESRD/hemodialysis; anemia, hypertension/volume and secondary hyperparathyroidism  HPI: Joan Young is a 54 y.o. female.  53yo HF with PMH sig for DM, CAD, CHF (EF 45%), HTN, and ESRD (on HD TTS at [facility]) was admitted 09/09/14 with 3 day h/o SSCP radiating to left side associated with SOB.  She underwent cardiac evaluation and felt to be clear for discharge, however she had some volume overload and hyperkalemia.  We were asked to help manage her ESRD and provide HD and treat her hyperkalemia.  She reports pain was completely relieved with one sublingual NTG.  Past Medical History  Diagnosis Date  . Obesity   . Macular degeneration disease   . Depression   . High cholesterol Dx 2005  . Hypertension Dx 4098  . Diabetes mellitus without complication DX 1969    type 51 since 54 years old but reports starting insulin at 54 years old  . Renal disorder   . Dialysis patient    Past Surgical History  Procedure Laterality Date  . Av fistula placement Left 12/19/2013    Procedure: Creation of Left Arm BRACHIOCEPHALIC ARTERIOVENOUS FISTULA ;  Surgeon: Fransisco Hertz, MD;  Location: [facility] OR;  Service: Vascular;  Laterality: Left;   Family History:   Family History  Problem Relation Age of Onset  . Hypertension Father   . Diabetes Father   . Kidney failure Father 2  . Kidney failure Sister 40  . Kidney failure Brother 30  . Kidney failure Sister 55   Social History:  reports that she has never smoked. She has never used smokeless tobacco. She reports that she does not drink alcohol or use illicit drugs. Allergies  Allergen Reactions  . Hydromorphone Itching  . Tape Rash   Prior to Admission medications   Medication Sig Start Date End Date Taking? Authorizing Provider  acetaminophen (TYLENOL) 325 MG tablet Take 650 mg by mouth every 6 (six) hours as needed (pain).   Yes  Historical Provider, MD  albuterol (PROVENTIL HFA;VENTOLIN HFA) 108 (90 BASE) MCG/ACT inhaler Inhale 2 puffs into the lungs every 6 (six) hours as needed for shortness of breath. And cough 07/08/14  Yes Ambrose Finland, NP  aspirin EC 81 MG tablet Take 81 mg by mouth daily.   Yes Historical Provider, MD  carvedilol (COREG) 25 MG tablet Take 1 tablet (25 mg total) by mouth 2 (two) times daily with a meal. 05/01/14  Yes Ambrose Finland, NP  cyclobenzaprine (FLEXERIL) 5 MG tablet Take 1 tablet (5 mg total) by mouth 3 (three) times daily as needed. For headaches 07/08/14  Yes Ambrose Finland, NP  FLUoxetine (PROZAC) 20 MG capsule Take 1 capsule (20 mg total) by mouth daily. 08/27/14  Yes Ambrose Finland, NP  gabapentin (NEURONTIN) 100 MG capsule Take 1 capsule (100 mg total) by mouth 3 (three) times daily. 08/27/14  Yes Ambrose Finland, NP  hydrALAZINE (APRESOLINE) 100 MG tablet TAKE 1 TABLET BY MOUTH 3 TIMES DAILY. 06/28/14  Yes Ambrose Finland, NP  insulin NPH Human (HUMULIN N,NOVOLIN N) 100 UNIT/ML injection Inject 0.08-0.24 mLs (8-24 Units total) into the skin 2 (two) times daily. 8 units in the morning and 24 units at after supper 05/01/14  Yes Ambrose Finland, NP  insulin regular (NOVOLIN R,HUMULIN R) 100 units/mL injection Inject 0.1-0.16 mLs (10-16 Units total) into the skin 3 (three) times daily before meals.  16 units every morning 16 units at lunch 10 units at bedtime.  Per sliding scale 05/01/14  Yes Ambrose Finland, NP  isosorbide mononitrate (IMDUR) 30 MG 24 hr tablet Take 30 mg by mouth daily.  03/15/14  Yes Historical Provider, MD  omeprazole (PRILOSEC) 20 MG capsule Take 1 capsule (20 mg total) by mouth daily. 05/01/14  Yes Ambrose Finland, NP  pravastatin (PRAVACHOL) 40 MG tablet Take 40 mg by mouth daily.   Yes Historical Provider, MD  ranitidine (ZANTAC) 150 MG tablet Take 1 tablet (150 mg total) by mouth daily. Patient taking differently: Take 150 mg by mouth 2 (two) times daily.  08/27/14  Yes Ambrose Finland,  NP   Current Facility-Administered Medications  Medication Dose Route Frequency Provider Last Rate Last Dose  . acetaminophen (TYLENOL) tablet 650 mg  650 mg Oral Q4H PRN Yevonne Pax, MD      . acetaminophen-codeine (TYLENOL #3) 300-30 MG per tablet 1 tablet  1 tablet Oral Q8H PRN Yevonne Pax, MD      . albuterol (PROVENTIL) (2.5 MG/3ML) 0.083% nebulizer solution 3 mL  3 mL Inhalation Q6H PRN Yevonne Pax, MD      . aspirin EC tablet 81 mg  81 mg Oral Daily Yevonne Pax, MD      . benzonatate (TESSALON) capsule 100 mg  100 mg Oral TID PRN Yevonne Pax, MD      . calcium carbonate (TUMS - dosed in mg elemental calcium) chewable tablet 200 mg of elemental calcium  1 tablet Oral TID WC Yevonne Pax, MD   200 mg of elemental calcium at 09/10/14 0811  . carvedilol (COREG) tablet 25 mg  25 mg Oral BID WC Yevonne Pax, MD      . cyclobenzaprine (FLEXERIL) tablet 5 mg  5 mg Oral TID PRN Yevonne Pax, MD      . famotidine (PEPCID) tablet 20 mg  20 mg Oral Daily Yevonne Pax, MD      . FLUoxetine (PROZAC) capsule 20 mg  20 mg Oral Daily Yevonne Pax, MD      . gabapentin (NEURONTIN) capsule 100 mg  100 mg Oral TID Yevonne Pax, MD   100 mg at 09/10/14 0024  . gi cocktail (Maalox,Lidocaine,Donnatal)  30 mL Oral QID PRN Yevonne Pax, MD      . heparin injection 5,000 Units  5,000 Units Subcutaneous 3 times per day Yevonne Pax, MD   5,000 Units at 09/10/14 [phone]  . hydrALAZINE (APRESOLINE) tablet 100 mg  100 mg Oral 3 times per day Yevonne Pax, MD   100 mg at 09/10/14 0115  . insulin aspart (novoLOG) injection 0-15 Units  0-15 Units Subcutaneous 6 times per day Yevonne Pax, MD   5 Units at 09/10/14 0440  . insulin NPH Human (HUMULIN N,NOVOLIN N) injection 24 Units  24 Units Subcutaneous QHS Yevonne Pax, MD   24 Units at 09/10/14 0025  . insulin NPH Human (HUMULIN N,NOVOLIN N) injection 8 Units  8 Units Subcutaneous Q breakfast Yevonne Pax, MD   8 Units at 09/10/14 0800  . isosorbide  mononitrate (IMDUR) 24 hr tablet 30 mg  30 mg Oral Daily Yevonne Pax, MD      . morphine 2 MG/ML injection 2 mg  2 mg Intravenous Q2H PRN Yevonne Pax, MD      . nitroGLYCERIN (NITROSTAT) 0.4 MG SL tablet           .  ondansetron (ZOFRAN) injection 4 mg  4 mg Intravenous Q6H PRN Yevonne Pax, MD      . pantoprazole (PROTONIX) EC tablet 40 mg  40 mg Oral Daily Yevonne Pax, MD      . pravastatin (PRAVACHOL) tablet 40 mg  40 mg Oral Daily Yevonne Pax, MD      . sodium polystyrene (KAYEXALATE) 15 GM/60ML suspension 15 g  15 g Oral Once Yevonne Pax, MD   15 g at 09/09/14 2330  . zolpidem (AMBIEN) tablet 5 mg  5 mg Oral QHS PRN Yevonne Pax, MD       Labs: Basic Metabolic Panel:  Recent Labs Lab 09/09/14 1816 09/09/14 2359  NA 133*  --   K 5.5*  --   CL 98*  --   CO2 24  --   GLUCOSE 408*  --   BUN 46*  --   CREATININE 6.64* 6.90*  CALCIUM 9.2  --    Liver Function Tests: No results for input(s): AST, ALT, ALKPHOS, BILITOT, PROT, ALBUMIN in the last 168 hours. No results for input(s): LIPASE, AMYLASE in the last 168 hours. No results for input(s): AMMONIA in the last 168 hours. CBC:  Recent Labs Lab 09/09/14 1816 09/09/14 2359  WBC 5.3 4.8  HGB 10.5* 9.4*  HCT 31.6* 28.0*  MCV 93.5 93.0  PLT 139* 130*   Cardiac Enzymes:  Recent Labs Lab 09/09/14 2359 09/10/14 0225 09/10/14 0523  TROPONINI 0.08* 0.07* 0.07*   CBG:  Recent Labs Lab 09/09/14 2318 09/10/14 0810  GLUCAP 329* 129*   Iron Studies: No results for input(s): IRON, TIBC, TRANSFERRIN, FERRITIN in the last 72 hours. Studies/Results: Dg Chest 2 View  09/09/2014   CLINICAL DATA:  54 year old female with history of left-sided chest pain, shoulder and neck pain for the past 2 days. Shortness of breath.  EXAM: CHEST  2 VIEW  COMPARISON:  Chest x-ray 07/31/2014.  FINDINGS: Lung volumes are normal. No consolidative airspace disease. No pleural effusions. Mild cephalization of the pulmonary vasculature,  with mild diffuse peribronchial cuffing and slight indistinctness of interstitial markings. Mild cardiomegaly. Upper mediastinal contours are within normal limits. Atherosclerosis in the thoracic aorta.  IMPRESSION: 1. The appearance the chest suggests very mild congestive heart failure, as above.   Electronically Signed   By: Trudie Reed M.D.   On: 09/09/2014 18:52    ROS: Pertinent items are noted in HPI. Physical Exam: Filed Vitals:   09/09/14 2130 09/09/14 2215 09/09/14 2325 09/10/14 0420  BP: 180/70 162/63 181/79 158/67  Pulse: 76 74 75 69  Temp:   98.2 F (36.8 C) 98.1 F (36.7 C)  TempSrc:   Oral Oral  Resp: Height:   5' (1.524 m)   Weight:   83.19 kg (183 lb 6.4 oz) 82.872 kg (182 lb 11.2 oz)  SpO2: 98% 96% 96% 99%      Weight change:  No intake or output data in the 24 hours ending 09/10/14 1033 BP 158/67 mmHg  Pulse 69  Temp(Src) 98.1 F (36.7 C) (Oral)  Resp 14  Ht 5' (1.524 m)  Wt 82.872 kg (182 lb 11.2 oz)  BMI 35.68 kg/m2  SpO2 99% General appearance: alert, cooperative and no distress Head: Normocephalic, without obvious abnormality, atraumatic Eyes: negative findings: lids and lashes normal, conjunctivae and sclerae normal and corneas clear Resp: clear to auscultation bilaterally Cardio: regular rate and rhythm, S1, S2 normal, no murmur, click, rub or gallop  GI: soft, non-tender; bowel sounds normal; no masses,  no organomegaly Extremities: extremities normal, atraumatic, no cyanosis or edema and RUE AVF +T/B Dialysis Access:  Dialysis Orders: HD: TTS [facility] 3.5h 177lb F200 350/800 Heparin was on 3600. RUA AVF (15 gauge needles). 2K/2.5Ca    Assessment/Plan: 1.  Chest Pain- negative cath stable for discharge after HD today 2.  ESRD -  Plan for HD today 3.  Hypertension/volume  - stable 4.  Anemia  - ESA as an outpt 5.  Metabolic bone disease -  Cont outpt meds 6.  Nutrition - per primary 7. Dispo- hopeful  discharge to home after HD today and follow up with her outpt unit on Thursday.  Isabel Freese A 09/10/2014, 10:33 AM

## 2014-09-10 NOTE — Discharge Summary (Signed)
Physician Discharge Summary  Joan Young ZOX:[id number] DOB: March 02, 1960 DOA: 09/09/2014  PCP: Ambrose Finland, NP  Admit date: 09/09/2014 Discharge date: 09/10/2014  Time spent: 40 minutes  Recommendations for Outpatient Follow-up:  1. Need OP follow-up c Cardiology [facility]-we have set-up an appt 2. Increased INdur this admission for prinzmetal Angina like symtpoms  3. Needs bmet and cbc 1 week 4. Consider Ranexa?  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 PMHx of CKD stage 5 now on tth/sat dialysis ~12/2013, DM Type 1 x 45 years, with diabetic retinopathy and neuropathy, HTN x > 20 years, HLD, CHF, Anemia, Ischemic cardiomyopathy NYHA cl IV She has the majority of her care at [facility] and has her Nephrologist in [place] and her Cardiologist at [facility] as well She had  Cardiac ath and that revealed chronic total RCA occlusion with mid to moderate disease in others and this was not amenable to further intervention She was admitted 09/10/14 with ongoing CP despite dialysis  troponins were marginally elevated and not really interpretable given ESRD She was felt to have no new ischemia after uptitration of her Imdur this Hopsital stay by Cardiologist Dr. Eden Emms She was dialyzed by Nephrology and felt to have maximally benefitted from Hospital stay adn was d/c home on 09/10/14 for close Cardiolopgy follow -up at [facility] with [facility]. Margo Aye [facility] at 09:45 09/10/14  Consultations:  Cardiology  Nephrology  Discharge Exam: Filed  Vitals:   09/10/14 1040  BP: 138/73  Pulse:   Temp:   Resp:     General: alert oriented, CP free Cardiovascular:  s1 s 2no m/r/g Respiratory: clear no added sound  Discharge Instructions    Current Discharge Medication List    START taking these medications   Details  nitroGLYCERIN (NITROSTAT) 0.4 MG SL tablet Place 1 tablet (0.4 mg total) under the tongue every 5 (five) minutes as needed for chest pain. Qty: 30 tablet, Refills: 0      CONTINUE these medications which have CHANGED   Details  isosorbide mononitrate (IMDUR) 30 MG 24 hr tablet Take 1 tablet (30 mg total) by mouth daily. Qty: 30 tablet, Refills: 0      CONTINUE these medications which have NOT CHANGED   Details  acetaminophen (TYLENOL) 325 MG tablet Take 650 mg by mouth every 6 (six) hours as needed (pain).    albuterol (PROVENTIL HFA;VENTOLIN HFA) 108 (90 BASE) MCG/ACT inhaler Inhale 2 puffs into the lungs every 6 (six) hours as needed for shortness of breath. And cough Qty: 1 Inhaler, Refills: 2   Associated Diagnoses: Cough    aspirin EC 81 MG tablet Take 81 mg by mouth daily.    carvedilol (COREG) 25 MG tablet Take 1 tablet (25 mg total) by mouth 2 (two) times daily with a meal. Qty: 60 tablet, Refills: 3   Associated Diagnoses: Acute on chronic combined systolic and diastolic congestive heart failure    cyclobenzaprine (FLEXERIL) 5 MG tablet Take 1 tablet (5 mg total)  by mouth 3 (three) times daily as needed. For headaches Qty: 30 tablet, Refills: 1   Associated Diagnoses: Persistent headaches    FLUoxetine (PROZAC) 20 MG capsule Take 1 capsule (20 mg total) by mouth daily. Qty: 30 capsule, Refills: 2    gabapentin (NEURONTIN) 100 MG capsule Take 1 capsule (100 mg total) by mouth 3 (three) times daily. Qty: 180 capsule, Refills: 0    hydrALAZINE (APRESOLINE) 100 MG tablet TAKE 1 TABLET BY MOUTH 3 TIMES DAILY. Qty: 90 tablet, Refills: 0    insulin NPH Human (HUMULIN N,NOVOLIN N) 100 UNIT/ML  injection Inject 0.08-0.24 mLs (8-24 Units total) into the skin 2 (two) times daily. 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 16 units every morning 16 units at lunch 10 units at bedtime.  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 Qty: 30 capsule, Refills: 2    pravastatin (PRAVACHOL) 40 MG tablet Take 40 mg by mouth daily.    ranitidine (ZANTAC) 150 MG tablet Take 1 tablet (150 mg total) by mouth daily. Qty: 60 tablet, Refills: 3      STOP taking these medications     acetaminophen-codeine (TYLENOL #3) 300-30 MG per tablet      benzonatate (TESSALON) 100 MG capsule      calcium carbonate (TUMS) 500 MG chewable tablet      zolpidem (AMBIEN) 10 MG tablet        Allergies  Allergen Reactions  . Hydromorphone Itching  . Tape Rash      The results of significant diagnostics from this hospitalization (including imaging, microbiology, ancillary and laboratory) are listed below for reference.    Significant Diagnostic Studies: Dg Chest 2 View  09/09/2014   CLINICAL DATA:  54 year old female with history of left-sided chest pain, shoulder and neck pain for the past 2 days. Shortness of breath.  EXAM: CHEST  2 VIEW  COMPARISON:  Chest x-ray 07/31/2014.  FINDINGS: Lung volumes are normal. No consolidative airspace disease. No pleural effusions. Mild cephalization of the pulmonary vasculature, with mild diffuse peribronchial cuffing and slight indistinctness of interstitial markings. Mild cardiomegaly. Upper mediastinal contours are within normal  limits. Atherosclerosis in the thoracic aorta.  IMPRESSION: 1. The appearance the chest suggests very mild congestive heart failure, as above.   Electronically Signed   By: Trudie Reed M.D.   On: 09/09/2014 18:52   Ct Head Wo Contrast  08/21/2014   CLINICAL DATA:  Headaches and tinnitus  EXAM: CT HEAD WITHOUT CONTRAST  TECHNIQUE: Contiguous axial images were obtained from the base of the skull through the vertex without intravenous contrast.  COMPARISON:  None.  FINDINGS: Bony calvarium is intact. Mucosal thickening is noted throughout the maxillary antra bilaterally slightly worse on the left than the right these changes are likely chronic in nature. No findings to suggest acute hemorrhage, acute infarction or space-occupying mass lesion are noted.  IMPRESSION: No acute intracranial abnormality noted. Bilateral mucosal thickening left greater than right within the maxillary antra.   Electronically Signed   By: Eulah Pont.D.  On: 08/21/2014 10:10   Ct Chest Wo Contrast  08/21/2014   CLINICAL DATA:  Productive cough for 2 months  EXAM: CT CHEST WITHOUT CONTRAST  TECHNIQUE: Multidetector CT imaging of the chest was performed following the standard protocol without IV contrast.  COMPARISON:  07/31/2014  FINDINGS: The lungs are well aerated bilaterally but demonstrates some minimal infiltrate in the medial aspect of the right middle lobe. No other focal infiltrate is seen. No sizable effusion is noted. The thoracic inlet is unremarkable. Atherosclerotic calcifications of the aorta are noted. Coronary calcifications are seen as well. A small pericardial effusion is noted no significant hilar or mediastinal adenopathy is noted.  Scanning into the upper abdomen reveals no acute abnormality. The bony structures are within normal limits.  IMPRESSION: Mild right middle lobe infiltrate.  Small pericardial effusion.  Atherosclerotic calcifications of thoracic aorta.   Electronically Signed   By: Alcide CleverMark  Lukens  M.D.   On: 08/21/2014 10:13    Microbiology: No results found for this or any previous visit (from the past 240 hour(s)).   Labs: Basic Metabolic Panel:  Recent Labs Lab 09/09/14 1816 09/09/14 2359  NA 133*  --   K 5.5*  --   CL 98*  --   CO2 24  --   GLUCOSE 408*  --   BUN 46*  --   CREATININE 6.64* 6.90*  CALCIUM 9.2  --    Liver Function Tests: No results for input(s): AST, ALT, ALKPHOS, BILITOT, PROT, ALBUMIN in the last 168 hours. No results for input(s): LIPASE, AMYLASE in the last 168 hours. No results for input(s): AMMONIA in the last 168 hours. CBC:  Recent Labs Lab 09/09/14 1816 09/09/14 2359 09/10/14 1435  WBC 5.3 4.8 5.0  NEUTROABS  --   --  3.9  HGB 10.5* 9.4* 9.1*  HCT 31.6* 28.0* 27.3*  MCV 93.5 93.0 94.1  PLT 139* 130* 134*   Cardiac Enzymes:  Recent Labs Lab 09/09/14 2359 09/10/14 0225 09/10/14 0523  TROPONINI 0.08* 0.07* 0.07*   BNP: BNP (last 3 results)  Recent Labs  03/31/14 0410  BNP 2127.3*    ProBNP (last 3 results)  Recent Labs  12/12/13 1351  PROBNP 12460.0*    CBG:  Recent Labs Lab 09/09/14 2318 09/10/14 0810  GLUCAP 329* 129*       Signed:  Rhetta MuraSAMTANI, JAI-GURMUKH  [facility] 09/10/2014, 2:48 PM

## 2014-09-10 NOTE — Progress Notes (Signed)
Discharge instructions reviewed in [place] and Spanish with support by family, Jeneen MontgomeryLaura Rojas (daughter). Explained new medications to patient and family. Instructed patient and family regarding location of nearest 24 hour pharmacy.  Patient discharged to home with family.

## 2014-09-10 NOTE — Progress Notes (Addendum)
Inpatient Diabetes Program Recommendations  AACE/ADA: New Consensus Statement on Inpatient Glycemic Control (2013)  Target Ranges:  Prepandial:   less than 140 mg/dL      Peak postprandial:   less than 180 mg/dL (1-2 hours)      Critically ill patients:  140 - 180 mg/dL   If eating, consider correction tidwc. Inpatient Diabetes Program Recommendations Correction (SSI): Correction is ordered for q 4 hrs. May need to change to tidwc.  Thank you Lenor CoffinAnn Jahki Witham, RN, MSN, CDE  Diabetes Inpatient Program Office: [phone] Pager: [phone] 8:00 am to 5:00 pm

## 2014-09-10 NOTE — Progress Notes (Signed)
Pt c/o back pain radiating to chest. 1 SL ntg given with relief. Dr. Eden EmmsNishan notified, new orders to increase imdur. Emelda Brothershristy Nattaly Yebra RN

## 2014-09-10 NOTE — Consult Note (Signed)
CARDIOLOGY CONSULT NOTE       Patient ID: Joan Young MRN: [id number] DOB/AGE: April 14, 1960 54 y.o.  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 hispanic female. Admitted with multiple somatic complaints including dyspnea and chest pain.  Been on dialysis since last year.  History of CHF with EF 45%.  [facility] showed chronic total RCA with collaterals and no flow limiting lesions on left.  She indicates some chest tightness yesterday with PND and orthopnea.  Cannot tell me her dry weight. Compliant with meds  No acute ECG changes (limb lead reversal called lateral infarct).  Troponin only .07 and no evolution.  Pain free this am.  CRF DM, Elevated lipids and HTN on good Rx She is fairly sedentary  Still with salt in diet.  Today is dialysis day.   ROS All other systems reviewed and negative except as noted above  Past Medical History  Diagnosis Date  . Obesity   . Macular degeneration disease   . Depression   . High cholesterol Dx 2005  . Hypertension Dx [id number]  . Diabetes mellitus without complication DX 1969    type 752 since 54 years old but reports starting insulin at 54 years old  . Renal disorder   . Dialysis patient     Family History  Problem Relation Age of Onset  . Hypertension Father   . Diabetes Father   . Kidney failure Father 5364  . Kidney failure Sister 7650  . Kidney failure Brother 30  . Kidney failure Sister 140    History   Social History  . Marital Status: Married    Spouse Name: N/A  . Number of Children: N/A  . Years of Education: 6th grade    Occupational History  . homemaker    Social History Main Topics  . Smoking status: Never Smoker   . Smokeless tobacco: Never Used  . Alcohol Use: No  . Drug Use: No  . Sexual Activity: Not on file   Other Topics Concern  . Not on file   Social History Narrative    Past Surgical History  Procedure Laterality Date  . Av fistula placement Left 12/19/2013    Procedure: Creation of Left Arm BRACHIOCEPHALIC ARTERIOVENOUS FISTULA ;  Surgeon: Fransisco HertzBrian L Chen, MD;  [facility];  Service: Vascular;  Laterality: Left;     . aspirin EC  81 mg Oral Daily  . calcium carbonate  1 tablet Oral TID WC  . carvedilol  25 mg Oral BID WC  . famotidine  20 mg Oral Daily  . FLUoxetine  20 mg Oral Daily  . gabapentin  100 mg Oral TID  . heparin  5,000 Units Subcutaneous 3 times per day  . hydrALAZINE  100 mg Oral 3 times per day  . insulin aspart  0-15 Units Subcutaneous 6 times per day  . insulin NPH Human  24 Units Subcutaneous QHS  . insulin NPH Human  8 Units Subcutaneous Q breakfast  . isosorbide mononitrate  30 mg Oral Daily  . pantoprazole  40 mg Oral Daily  . pravastatin  40  mg Oral Daily  . sodium polystyrene  15 g Oral Once      Physical Exam: Blood pressure 158/67, pulse 69, temperature 98.1 F (36.7 C), temperature source Oral, resp. rate 14, height 5' (1.524 m), weight 82.872 kg (182 lb 11.2 oz), SpO2 99 %.    Affect appropriate Obese hispanic female  HEENT: normal Neck supple with no adenopathy JVP normal no bruits no thyromegaly Lungs clear with no wheezing and good diaphragmatic motion Heart:  S1/S2 SEM murmur, no rub, gallop or click PMI normal Abdomen: benighn, BS positve, no tenderness, no AAA no bruit.  No HSM or HJR Distal pulses intact with no bruits Plus one bilateral  edema Neuro non-focal Skin warm and dry Fistula in RUE    Labs:   Lab Results  Component Value Date   WBC 4.8 09/09/2014   HGB 9.4* 09/09/2014   HCT 28.0* 09/09/2014   MCV 93.0  09/09/2014   PLT 130* 09/09/2014    Recent Labs Lab 09/09/14 1816 09/09/14 2359  NA 133*  --   K 5.5*  --   CL 98*  --   CO2 24  --   BUN 46*  --   CREATININE 6.64* 6.90*  CALCIUM 9.2  --   GLUCOSE 408*  --    Lab Results  Component Value Date   TROPONINI 0.07* 09/10/2014   Radiology: Dg Chest 2 View  09/09/2014   CLINICAL DATA:  54 year old female with history of left-sided chest pain, shoulder and neck pain for the past 2 days. Shortness of breath.  EXAM: CHEST  2 VIEW  COMPARISON:  Chest x-ray 07/31/2014.  FINDINGS: Lung volumes are normal. No consolidative airspace disease. No pleural effusions. Mild cephalization of the pulmonary vasculature, with mild diffuse peribronchial cuffing and slight indistinctness of interstitial markings. Mild cardiomegaly. Upper mediastinal contours are within normal limits. Atherosclerosis in the thoracic aorta.  IMPRESSION: 1. The appearance the chest suggests very mild congestive heart failure, as above.   Electronically Signed   By: Trudie Reed M.D.   On: 09/09/2014 18:52   Ct Head Wo Contrast  08/21/2014   CLINICAL DATA:  Headaches and tinnitus  EXAM: CT HEAD WITHOUT CONTRAST  TECHNIQUE: Contiguous axial images were obtained from the base of the skull through the vertex without intravenous contrast.  COMPARISON:  None.  FINDINGS: Bony calvarium is intact. Mucosal thickening is noted throughout the maxillary antra bilaterally slightly worse on the left than the right these changes are likely chronic in nature. No findings to suggest acute hemorrhage, acute infarction or space-occupying mass lesion are noted.  IMPRESSION: No acute intracranial abnormality noted. Bilateral mucosal thickening left greater than right within the maxillary antra.   Electronically Signed   By: Alcide Clever M.D.   On: 08/21/2014 10:10   Ct Chest Wo Contrast  08/21/2014   CLINICAL DATA:  Productive cough for 2 months  EXAM: CT CHEST WITHOUT CONTRAST  TECHNIQUE:  Multidetector CT imaging of the chest was performed following the standard protocol without IV contrast.  COMPARISON:  07/31/2014  FINDINGS: The lungs are well aerated bilaterally but demonstrates some minimal infiltrate in the medial aspect of the right middle lobe. No other focal infiltrate is seen. No sizable effusion is noted. The thoracic inlet is unremarkable. Atherosclerotic calcifications of the aorta are noted. Coronary calcifications are seen as well. A small pericardial effusion is noted no significant hilar or mediastinal adenopathy is noted.  Scanning into the upper abdomen reveals no acute abnormality. The  bony structures are within normal limits.  IMPRESSION: Mild right middle lobe infiltrate.  Small pericardial effusion.  Atherosclerotic calcifications of thoracic aorta.   Electronically Signed   By: Alcide Clever M.D.   On: 08/21/2014 10:13    EKG: SR limb lead reversal Q in lead 2 no acute changes   ASSESSMENT AND PLAN:  Chest Pain:  Self limited No acute ECG changes and troponin only .07 in a dialysis patient. Recent cath [facility] with total RCA with collaterals and no flow limiting lesions on left.  She is pain free this morning.  No need for further inpatient testing unless pain returns or repeat troponins very elevated.  Myovue would not help would be abnormal in collateralized [place].  Continue medical Rx  Outpatient f/u at [facility]  CHF:  Echo pending EF 45% on echo earlier this year.  CXR mild cephalization No BNP  Can take off a bit of extra fluid at dialysis today. She slept flat last night.  Low sodium diet  HTN:  Continue current meds Denies hypotensive episodes at dialysis Intolerant to lisinopril  Chol:  On statin   Signed: Charlton Haws 09/10/2014, 7:31 AM

## 2014-09-11 LAB — HEMOGLOBIN A1C
Hgb A1c MFr Bld: 9.4 % — ABNORMAL HIGH (ref 4.8–5.6)
MEAN PLASMA GLUCOSE: 223 mg/dL

## 2014-09-11 LAB — HEPATITIS B SURFACE ANTIGEN: Hepatitis B Surface Ag: NEGATIVE

## 2014-09-16 LAB — GLUCOSE, CAPILLARY
GLUCOSE-CAPILLARY: 31 mg/dL — AB (ref 65–99)
Glucose-Capillary: 215 mg/dL — ABNORMAL HIGH (ref 65–99)
Glucose-Capillary: 188 mg/dL — ABNORMAL HIGH (ref 65–99)

## 2014-10-07 ENCOUNTER — Emergency Department (HOSPITAL_COMMUNITY): Payer: BLUE CROSS/BLUE SHIELD

## 2014-10-07 ENCOUNTER — Inpatient Hospital Stay (HOSPITAL_COMMUNITY)
Admission: EM | Admit: 2014-10-07 | Discharge: 2014-10-10 | DRG: 193 | Disposition: A | Discharge status: Home / Self Care | Payer: BLUE CROSS/BLUE SHIELD | Attending: Internal Medicine | Admitting: Internal Medicine

## 2014-10-07 ENCOUNTER — Encounter (HOSPITAL_COMMUNITY): Payer: Self-pay | Admitting: *Deleted

## 2014-10-07 DIAGNOSIS — J9601 Acute respiratory failure with hypoxia: Secondary | ICD-10-CM

## 2014-10-07 DIAGNOSIS — Z794 Long term (current) use of insulin: Secondary | ICD-10-CM | POA: Diagnosis not present

## 2014-10-07 DIAGNOSIS — N186 End stage renal disease: Secondary | ICD-10-CM

## 2014-10-07 DIAGNOSIS — E1065 Type 1 diabetes mellitus with hyperglycemia: Secondary | ICD-10-CM | POA: Diagnosis present

## 2014-10-07 DIAGNOSIS — Z79899 Other long term (current) drug therapy: Secondary | ICD-10-CM

## 2014-10-07 DIAGNOSIS — F329 Major depressive disorder, single episode, unspecified: Secondary | ICD-10-CM | POA: Diagnosis present

## 2014-10-07 DIAGNOSIS — Y95 Nosocomial condition: Secondary | ICD-10-CM | POA: Diagnosis present

## 2014-10-07 DIAGNOSIS — Z885 Allergy status to narcotic agent status: Secondary | ICD-10-CM

## 2014-10-07 DIAGNOSIS — Z8249 Family history of ischemic heart disease and other diseases of the circulatory system: Secondary | ICD-10-CM | POA: Diagnosis not present

## 2014-10-07 DIAGNOSIS — J189 Pneumonia, unspecified organism: Secondary | ICD-10-CM | POA: Diagnosis present

## 2014-10-07 DIAGNOSIS — Z841 Family history of disorders of kidney and ureter: Secondary | ICD-10-CM | POA: Diagnosis not present

## 2014-10-07 DIAGNOSIS — Z833 Family history of diabetes mellitus: Secondary | ICD-10-CM | POA: Diagnosis not present

## 2014-10-07 DIAGNOSIS — R0600 Dyspnea, unspecified: Secondary | ICD-10-CM | POA: Diagnosis not present

## 2014-10-07 DIAGNOSIS — Z6834 Body mass index (BMI) 34.0-34.9, adult: Secondary | ICD-10-CM

## 2014-10-07 DIAGNOSIS — Z992 Dependence on renal dialysis: Secondary | ICD-10-CM

## 2014-10-07 DIAGNOSIS — R072 Precordial pain: Secondary | ICD-10-CM

## 2014-10-07 DIAGNOSIS — E1022 Type 1 diabetes mellitus with diabetic chronic kidney disease: Secondary | ICD-10-CM | POA: Diagnosis present

## 2014-10-07 DIAGNOSIS — E1140 Type 2 diabetes mellitus with diabetic neuropathy, unspecified: Secondary | ICD-10-CM | POA: Diagnosis present

## 2014-10-07 DIAGNOSIS — R079 Chest pain, unspecified: Secondary | ICD-10-CM | POA: Diagnosis not present

## 2014-10-07 DIAGNOSIS — E669 Obesity, unspecified: Secondary | ICD-10-CM | POA: Diagnosis present

## 2014-10-07 DIAGNOSIS — Z7982 Long term (current) use of aspirin: Secondary | ICD-10-CM

## 2014-10-07 DIAGNOSIS — E785 Hyperlipidemia, unspecified: Secondary | ICD-10-CM | POA: Diagnosis present

## 2014-10-07 DIAGNOSIS — I2510 Atherosclerotic heart disease of native coronary artery without angina pectoris: Secondary | ICD-10-CM | POA: Diagnosis present

## 2014-10-07 DIAGNOSIS — E780 Pure hypercholesterolemia: Secondary | ICD-10-CM | POA: Diagnosis present

## 2014-10-07 DIAGNOSIS — I160 Hypertensive urgency: Secondary | ICD-10-CM | POA: Diagnosis present

## 2014-10-07 DIAGNOSIS — H3530 Unspecified macular degeneration: Secondary | ICD-10-CM | POA: Diagnosis present

## 2014-10-07 DIAGNOSIS — I120 Hypertensive chronic kidney disease with stage 5 chronic kidney disease or end stage renal disease: Secondary | ICD-10-CM | POA: Diagnosis present

## 2014-10-07 DIAGNOSIS — E1040 Type 1 diabetes mellitus with diabetic neuropathy, unspecified: Secondary | ICD-10-CM | POA: Diagnosis present

## 2014-10-07 DIAGNOSIS — E1021 Type 1 diabetes mellitus with diabetic nephropathy: Secondary | ICD-10-CM | POA: Diagnosis present

## 2014-10-07 DIAGNOSIS — I5043 Acute on chronic combined systolic (congestive) and diastolic (congestive) heart failure: Secondary | ICD-10-CM | POA: Diagnosis present

## 2014-10-07 DIAGNOSIS — Z91048 Other nonmedicinal substance allergy status: Secondary | ICD-10-CM | POA: Diagnosis not present

## 2014-10-07 DIAGNOSIS — I10 Essential (primary) hypertension: Secondary | ICD-10-CM

## 2014-10-07 DIAGNOSIS — I5042 Chronic combined systolic (congestive) and diastolic (congestive) heart failure: Secondary | ICD-10-CM | POA: Insufficient documentation

## 2014-10-07 DIAGNOSIS — D649 Anemia, unspecified: Secondary | ICD-10-CM | POA: Diagnosis not present

## 2014-10-07 DIAGNOSIS — R0902 Hypoxemia: Secondary | ICD-10-CM | POA: Diagnosis present

## 2014-10-07 DIAGNOSIS — E1029 Type 1 diabetes mellitus with other diabetic kidney complication: Secondary | ICD-10-CM | POA: Diagnosis present

## 2014-10-07 DIAGNOSIS — IMO0002 Reserved for concepts with insufficient information to code with codable children: Secondary | ICD-10-CM | POA: Diagnosis present

## 2014-10-07 LAB — TROPONIN I
TROPONIN I: 0.05 ng/mL — AB (ref <0.031)
Troponin I: 0.04 ng/mL — ABNORMAL HIGH (ref <0.031)

## 2014-10-07 LAB — BASIC METABOLIC PANEL
Anion gap: 13 (ref 5–15)
BUN: 40 mg/dL — AB (ref 6–20)
CO2: 22 mmol/L (ref 22–32)
Calcium: 9.4 mg/dL (ref 8.9–10.3)
Chloride: 102 mmol/L (ref 101–111)
Creatinine, Ser: 5.89 mg/dL — ABNORMAL HIGH (ref 0.44–1.00)
GFR calc non Af Amer: 7 mL/min — ABNORMAL LOW (ref >60)
GFR, EST AFRICAN AMERICAN: 9 mL/min — AB (ref >60)
Glucose, Bld: 299 mg/dL — ABNORMAL HIGH (ref 65–99)
Potassium: 4.3 mmol/L (ref 3.5–5.1)
SODIUM: 137 mmol/L (ref 135–145)

## 2014-10-07 LAB — GLUCOSE, CAPILLARY: Glucose-Capillary: 204 mg/dL — ABNORMAL HIGH (ref 65–99)

## 2014-10-07 LAB — CBC
HCT: 30.6 % — ABNORMAL LOW (ref 36.0–46.0)
HEMOGLOBIN: 10.5 g/dL — AB (ref 12.0–15.0)
MCH: 32.2 pg (ref 26.0–34.0)
MCHC: 34.3 g/dL (ref 30.0–36.0)
MCV: 93.9 fL (ref 78.0–100.0)
Platelets: 163 10*3/uL (ref 150–400)
RBC: 3.26 MIL/uL — AB (ref 3.87–5.11)
RDW: 14.2 % (ref 11.5–15.5)
WBC: 6.5 10*3/uL (ref 4.0–10.5)

## 2014-10-07 LAB — CBG MONITORING, ED: Glucose-Capillary: 144 mg/dL — ABNORMAL HIGH (ref 65–99)

## 2014-10-07 LAB — I-STAT TROPONIN, ED: TROPONIN I, POC: 0.02 ng/mL (ref 0.00–0.08)

## 2014-10-07 MED ORDER — AMLODIPINE BESYLATE 10 MG PO TABS: 10.0000 mg | ORAL_TABLET | Freq: Every day | ORAL | Status: DC

## 2014-10-07 MED ORDER — SODIUM CHLORIDE 0.9 % IJ SOLN
3.0000 mL | Freq: Two times a day (BID) | INTRAMUSCULAR | Status: DC
Start: 2014-10-07 — End: 2014-10-10
  Administered 2014-10-08 – 2014-10-10 (×4): 3 mL via INTRAVENOUS

## 2014-10-07 MED ORDER — AMLODIPINE BESYLATE 10 MG PO TABS
10.0000 mg | ORAL_TABLET | Freq: Every day | ORAL | Status: DC
  Administered 2014-10-08 – 2014-10-10 (×3): 10 mg via ORAL
  Filled 2014-10-07 (×3): qty 1

## 2014-10-07 MED ORDER — INSULIN ASPART 100 UNIT/ML ~~LOC~~ SOLN: 3.0000 [IU] | Freq: Three times a day (TID) | SUBCUTANEOUS | Status: DC

## 2014-10-07 MED ORDER — IOHEXOL 350 MG/ML SOLN
100.0000 mL | Freq: Once | INTRAVENOUS | Status: AC | PRN
  Administered 2014-10-07: 100 mL via INTRAVENOUS

## 2014-10-07 MED ORDER — DEXTROSE 5 % IV SOLN: 2.0000 g | Freq: Three times a day (TID) | INTRAVENOUS | Status: DC

## 2014-10-07 MED ORDER — PRAVASTATIN SODIUM 40 MG PO TABS
40.0000 mg | ORAL_TABLET | Freq: Every day | ORAL | Status: DC
Start: 2014-10-08 — End: 2014-10-10
  Administered 2014-10-08 – 2014-10-10 (×3): 40 mg via ORAL
  Filled 2014-10-07 (×3): qty 1

## 2014-10-07 MED ORDER — CARVEDILOL 25 MG PO TABS
25.0000 mg | ORAL_TABLET | Freq: Two times a day (BID) | ORAL | Status: DC
  Administered 2014-10-07 – 2014-10-09 (×5): 25 mg via ORAL
  Filled 2014-10-07 (×8): qty 1

## 2014-10-07 MED ORDER — PANTOPRAZOLE SODIUM 40 MG PO TBEC
40.0000 mg | DELAYED_RELEASE_TABLET | Freq: Every day | ORAL | Status: DC
  Administered 2014-10-08 – 2014-10-10 (×3): 40 mg via ORAL
  Filled 2014-10-07 (×2): qty 1

## 2014-10-07 MED ORDER — INSULIN GLARGINE 100 UNIT/ML ~~LOC~~ SOLN
25.0000 [IU] | Freq: Every day | SUBCUTANEOUS | Status: DC
  Administered 2014-10-07: 25 [IU] via SUBCUTANEOUS
  Filled 2014-10-07 (×2): qty 0.25

## 2014-10-07 MED ORDER — HYDRALAZINE HCL 100 MG PO TABS: 100.0000 mg | ORAL_TABLET | Freq: Three times a day (TID) | ORAL | Status: DC

## 2014-10-07 MED ORDER — GABAPENTIN 100 MG PO CAPS
100.0000 mg | ORAL_CAPSULE | Freq: Every day | ORAL | Status: DC
  Administered 2014-10-07 – 2014-10-09 (×3): 100 mg via ORAL
  Filled 2014-10-07 (×4): qty 1

## 2014-10-07 MED ORDER — NITROGLYCERIN 0.4 MG SL SUBL
0.4000 mg | SUBLINGUAL_TABLET | SUBLINGUAL | Status: DC | PRN
Start: 2014-10-07 — End: 2014-10-10

## 2014-10-07 MED ORDER — HEPARIN SODIUM (PORCINE) 5000 UNIT/ML IJ SOLN
5000.0000 [IU] | Freq: Three times a day (TID) | INTRAMUSCULAR | Status: DC
  Administered 2014-10-07 – 2014-10-10 (×8): 5000 [IU] via SUBCUTANEOUS
  Filled 2014-10-07 (×11): qty 1

## 2014-10-07 MED ORDER — DEXTROSE 5 % IV SOLN
2.0000 g | Freq: Once | INTRAVENOUS | Status: AC
  Administered 2014-10-07: 2 g via INTRAVENOUS
  Filled 2014-10-07: qty 2

## 2014-10-07 MED ORDER — PIPERACILLIN-TAZOBACTAM IN DEX 2-0.25 GM/50ML IV SOLN
2.2500 g | Freq: Three times a day (TID) | INTRAVENOUS | Status: DC
  Filled 2014-10-07 (×3): qty 50

## 2014-10-07 MED ORDER — ALBUTEROL SULFATE HFA 108 (90 BASE) MCG/ACT IN AERS: 2.0000 | INHALATION_SPRAY | Freq: Four times a day (QID) | RESPIRATORY_TRACT | Status: DC | PRN

## 2014-10-07 MED ORDER — SODIUM CHLORIDE 0.9 % IJ SOLN: 3.0000 mL | INTRAMUSCULAR | Status: DC | PRN

## 2014-10-07 MED ORDER — HYDRALAZINE HCL 20 MG/ML IJ SOLN
10.0000 mg | Freq: Four times a day (QID) | INTRAMUSCULAR | Status: DC | PRN
  Administered 2014-10-07: 10 mg via INTRAVENOUS
  Filled 2014-10-07 (×2): qty 1

## 2014-10-07 MED ORDER — ASPIRIN EC 81 MG PO TBEC
81.0000 mg | DELAYED_RELEASE_TABLET | Freq: Every day | ORAL | Status: DC
  Administered 2014-10-08 – 2014-10-10 (×3): 81 mg via ORAL
  Filled 2014-10-07 (×3): qty 1

## 2014-10-07 MED ORDER — ASPIRIN EC 81 MG PO TBEC: 81.0000 mg | DELAYED_RELEASE_TABLET | Freq: Every day | ORAL | Status: DC

## 2014-10-07 MED ORDER — ACETAMINOPHEN 325 MG PO TABS
650.0000 mg | ORAL_TABLET | Freq: Four times a day (QID) | ORAL | Status: DC | PRN
  Administered 2014-10-07 – 2014-10-10 (×4): 650 mg via ORAL
  Filled 2014-10-07 (×4): qty 2

## 2014-10-07 MED ORDER — ALBUTEROL SULFATE (2.5 MG/3ML) 0.083% IN NEBU: 2.5000 mg | INHALATION_SOLUTION | Freq: Four times a day (QID) | RESPIRATORY_TRACT | Status: DC | PRN

## 2014-10-07 MED ORDER — ISOSORBIDE MONONITRATE ER 60 MG PO TB24
60.0000 mg | ORAL_TABLET | Freq: Every day | ORAL | Status: DC
  Administered 2014-10-08 – 2014-10-10 (×3): 60 mg via ORAL
  Filled 2014-10-07 (×3): qty 1

## 2014-10-07 MED ORDER — NITROGLYCERIN 0.4 MG SL SUBL: 0.4000 mg | SUBLINGUAL_TABLET | SUBLINGUAL | Status: DC | PRN

## 2014-10-07 MED ORDER — SENNOSIDES-DOCUSATE SODIUM 8.6-50 MG PO TABS
1.0000 | ORAL_TABLET | Freq: Every evening | ORAL | Status: DC | PRN
  Filled 2014-10-07: qty 1

## 2014-10-07 MED ORDER — ACETAMINOPHEN 650 MG RE SUPP: 650.0000 mg | Freq: Four times a day (QID) | RECTAL | Status: DC | PRN

## 2014-10-07 MED ORDER — SODIUM CHLORIDE 0.9 % IV SOLN: 250.0000 mL | INTRAVENOUS | Status: DC | PRN

## 2014-10-07 MED ORDER — SODIUM CHLORIDE 0.9 % IJ SOLN
3.0000 mL | Freq: Two times a day (BID) | INTRAMUSCULAR | Status: DC
  Administered 2014-10-08 – 2014-10-09 (×3): 3 mL via INTRAVENOUS

## 2014-10-07 MED ORDER — LOSARTAN POTASSIUM 50 MG PO TABS
50.0000 mg | ORAL_TABLET | Freq: Every day | ORAL | Status: DC
Start: 2014-10-08 — End: 2014-10-10
  Administered 2014-10-08 – 2014-10-10 (×3): 50 mg via ORAL
  Filled 2014-10-07 (×3): qty 1

## 2014-10-07 MED ORDER — PANTOPRAZOLE SODIUM 40 MG PO TBEC: 40.0000 mg | DELAYED_RELEASE_TABLET | Freq: Every day | ORAL | Status: DC

## 2014-10-07 MED ORDER — TIMOLOL HEMIHYDRATE 0.5 % OP SOLN: 1.0000 [drp] | Freq: Every day | OPHTHALMIC | Status: DC

## 2014-10-07 MED ORDER — FAMOTIDINE 20 MG PO TABS
20.0000 mg | ORAL_TABLET | Freq: Every day | ORAL | Status: DC
  Administered 2014-10-07 – 2014-10-09 (×3): 20 mg via ORAL
  Filled 2014-10-07 (×4): qty 1

## 2014-10-07 MED ORDER — FLUOXETINE HCL 20 MG PO CAPS
40.0000 mg | ORAL_CAPSULE | Freq: Every day | ORAL | Status: DC
  Administered 2014-10-08 – 2014-10-10 (×3): 40 mg via ORAL
  Filled 2014-10-07 (×3): qty 2

## 2014-10-07 MED ORDER — TIMOLOL MALEATE 0.5 % OP SOLN
1.0000 [drp] | Freq: Every day | OPHTHALMIC | Status: DC
  Administered 2014-10-09 – 2014-10-10 (×2): 1 [drp] via OPHTHALMIC
  Filled 2014-10-07 (×2): qty 5

## 2014-10-07 MED ORDER — VANCOMYCIN HCL 10 G IV SOLR
2000.0000 mg | Freq: Once | INTRAVENOUS | Status: AC
  Administered 2014-10-07: 2000 mg via INTRAVENOUS
  Filled 2014-10-07: qty 2000

## 2014-10-07 MED ORDER — INSULIN ASPART 100 UNIT/ML ~~LOC~~ SOLN
0.0000 [IU] | Freq: Three times a day (TID) | SUBCUTANEOUS | Status: DC
  Administered 2014-10-07: 2 [IU] via SUBCUTANEOUS
  Administered 2014-10-08: 3 [IU] via SUBCUTANEOUS
  Administered 2014-10-09 – 2014-10-10 (×3): 5 [IU] via SUBCUTANEOUS
  Filled 2014-10-07: qty 1

## 2014-10-07 MED ORDER — CARVEDILOL 25 MG PO TABS
25.0000 mg | ORAL_TABLET | Freq: Once | ORAL | Status: AC
  Administered 2014-10-07: 25 mg via ORAL
  Filled 2014-10-07: qty 1

## 2014-10-07 MED ORDER — HYDRALAZINE HCL 50 MG PO TABS
100.0000 mg | ORAL_TABLET | Freq: Three times a day (TID) | ORAL | Status: DC
  Administered 2014-10-07 – 2014-10-10 (×7): 100 mg via ORAL
  Filled 2014-10-07 (×12): qty 2

## 2014-10-07 NOTE — ED Notes (Signed)
Family reports that patients blood pressure has been within normal limits for approx the last month after she was placed on new blood pressure medication started. Dialysis to right arm, thrill positive, tues thurs sat, has not missed.

## 2014-10-07 NOTE — ED Notes (Signed)
Patient being transported to x-ray.

## 2014-10-07 NOTE — ED Notes (Signed)
Patient states that she is still having mild sob and chest pressure but denies any pain. Patient is unable to get comfortable. Family and patient aware of poc.

## 2014-10-07 NOTE — ED Provider Notes (Signed)
CSN: [id number]     Arrival date & time 10/07/14  1201 History   First MD Initiated Contact with Patient 10/07/14 1223     Chief Complaint  Patient presents with  . Chest Pain  . Shortness of Breath     (Consider location/radiation/quality/duration/timing/severity/associated sxs/prior Treatment) The history is provided by the patient and medical records.    This is a 54 year old female with history of hypertension, depression, hyperlipidemia, obesity, end-stage renal disease on hemodialysis, presenting to the ED for chest pain and shortness of breath. Patient states symptoms began yesterday afternoon and have been constant throughout the day today. She describes her chest pain as a "pressure" across her entire chest.  She reports associated shortness of breath and some intermittent lightheadedness. She did have one episode of emesis yesterday, does not feel is related to her chest pain.  She threw up this morning as well after taking her BP meds.  Patient denies any recent fever, chills, or sweats. She last received dialysis on Saturday (schedules Tues, Thurs, Sat).  Patient followed by cardiology at [facility], Dr. Revonda Humphrey.  Recent admission last month for chest pain and mildly elevated troponin.  Daughter admits that these episodes of chest pain and SOB have been ongoing for the past year.  Patient is not sure what is different about today.  Of note, patient also recently put on home O2 for pneumonia-- states actually was diagnosed with this 3 months ago, no longer on abx.  VSS.  Past Medical History  Diagnosis Date  . Obesity   . Macular degeneration disease   . Depression   . High cholesterol Dx 2005  . Hypertension Dx 4098  . Diabetes mellitus without complication DX 1969    type 45 since 54 years old but reports starting insulin at 54 years old  . Renal disorder   . Dialysis patient    Past Surgical History  Procedure Laterality Date  . Av fistula placement Left  12/19/2013    Procedure: Creation of Left Arm BRACHIOCEPHALIC ARTERIOVENOUS FISTULA ;  Surgeon: Fransisco Hertz, MD;  Location: [facility] OR;  Service: Vascular;  Laterality: Left;  . Back surgery     Family History  Problem Relation Age of Onset  . Hypertension Father   . Diabetes Father   . Kidney failure Father 31  . Kidney failure Sister 32  . Kidney failure Brother 30  . Kidney failure Sister 5   History  Substance Use Topics  . Smoking status: Never Smoker   . Smokeless tobacco: Never Used  . Alcohol Use: No   OB History    No data available     Review of Systems  Respiratory: Positive for shortness of breath.   Cardiovascular: Positive for chest pain.  All other systems reviewed and are negative.     Allergies  Hydromorphone and Tape  Home Medications   Prior to Admission medications   Medication Sig Start Date End Date Taking? Authorizing Provider  acetaminophen (TYLENOL) 325 MG tablet Take 650 mg by mouth every 6 (six) hours as needed (pain).    Historical Provider, MD  albuterol (PROVENTIL HFA;VENTOLIN HFA) 108 (90 BASE) MCG/ACT inhaler Inhale 2 puffs into the lungs every 6 (six) hours as needed for shortness of breath. And cough 07/08/14   Ambrose Finland, NP  aspirin EC 81 MG tablet Take 81 mg by mouth daily.    Historical Provider, MD  carvedilol (COREG) 25 MG tablet Take 1 tablet (25 mg total)  by mouth 2 (two) times daily with a meal. 05/01/14   Ambrose Finland, NP  cyclobenzaprine (FLEXERIL) 5 MG tablet Take 1 tablet (5 mg total) by mouth 3 (three) times daily as needed. For headaches 07/08/14   Ambrose Finland, NP  FLUoxetine (PROZAC) 20 MG capsule Take 1 capsule (20 mg total) by mouth daily. 08/27/14   Ambrose Finland, NP  gabapentin (NEURONTIN) 100 MG capsule Take 1 capsule (100 mg total) by mouth 3 (three) times daily. 08/27/14   Ambrose Finland, NP  hydrALAZINE (APRESOLINE) 100 MG tablet TAKE 1 TABLET BY MOUTH 3 TIMES DAILY. 06/28/14   Ambrose Finland, NP  insulin NPH Human  (HUMULIN N,NOVOLIN N) 100 UNIT/ML injection Inject 0.08-0.24 mLs (8-24 Units total) into the skin 2 (two) times daily. 8 units in the morning and 24 units at after supper 05/01/14   Ambrose Finland, NP  insulin regular (NOVOLIN R,HUMULIN R) 100 units/mL injection Inject 0.1-0.16 mLs (10-16 Units total) into the skin 3 (three) times daily before meals. 16 units every morning 16 units at lunch 10 units at bedtime.  Per sliding scale 05/01/14   Ambrose Finland, NP  isosorbide mononitrate (IMDUR) 60 MG 24 hr tablet Take 1 tablet (60 mg total) by mouth daily. 09/10/14   Rhetta Mura, MD  nitroGLYCERIN (NITROSTAT) 0.4 MG SL tablet Place 1 tablet (0.4 mg total) under the tongue every 5 (five) minutes as needed for chest pain. 09/10/14   Rhetta Mura, MD  omeprazole (PRILOSEC) 20 MG capsule Take 1 capsule (20 mg total) by mouth daily. 05/01/14   Ambrose Finland, NP  pravastatin (PRAVACHOL) 40 MG tablet Take 40 mg by mouth daily.    Historical Provider, MD  ranitidine (ZANTAC) 150 MG tablet Take 1 tablet (150 mg total) by mouth daily. Patient taking differently: Take 150 mg by mouth 2 (two) times daily.  08/27/14   Ambrose Finland, NP   BP 210/83 mmHg  Pulse 99  Temp(Src) 98 F (36.7 C) (Oral)  Resp 26  SpO2 98%   Physical Exam  Constitutional: She is oriented to person, place, and time. She appears well-developed and well-nourished. No distress.  Chronically ill appearing  HENT:  Head: Normocephalic and atraumatic.  Mouth/Throat: Oropharynx is clear and moist.  Eyes: Conjunctivae and EOM are normal. Pupils are equal, round, and reactive to light.  Neck: Normal range of motion.  Cardiovascular: Normal rate, regular rhythm and normal heart sounds.   Pulmonary/Chest: Effort normal and breath sounds normal. No respiratory distress. She has no wheezes.  Abdominal: Soft. Bowel sounds are normal.  Musculoskeletal: Normal range of motion.  Dialysis fistula RUE, strong thrill  Neurological: She is alert  and oriented to person, place, and time.  Skin: Skin is warm and dry. She is not diaphoretic.  Psychiatric: She has a normal mood and affect.  Nursing note and vitals reviewed.   ED Course  Procedures (including critical care time)  Labs Review Labs Reviewed  BASIC METABOLIC PANEL - Abnormal; Notable for the following:    Glucose, Bld 299 (*)    BUN 40 (*)    Creatinine, Ser 5.89 (*)    GFR calc non Af Amer 7 (*)    GFR calc Af Amer 9 (*)    All other components within normal limits  CBC - Abnormal; Notable for the following:    RBC 3.26 (*)    Hemoglobin 10.5 (*)    HCT 30.6 (*)    All  other components within normal limits  Rosezena Sensor, ED    Imaging Review Dg Chest 2 View  10/07/2014   CLINICAL DATA:  Shortness of breath.  Chest pain.  EXAM: CHEST  2 VIEW  COMPARISON:  09/09/2014 and 07/31/2014 and chest CT dated 08/21/2014  FINDINGS: There is new small patchy area of infiltrate at one of the lung bases seen on the lateral view. This is not apparent on the AP view.  There is chronic enlargement of the cardiac silhouette with the previously demonstrated pericardial effusion. Pulmonary vascularity is normal. No pleural effusions. No osseous abnormality.  IMPRESSION: New small infiltrate posteriorly at 1 of the lung bases. Chronic enlargement of the cardiac silhouette at least in part due to a previously demonstrated pericardial effusion.   Electronically Signed   By: Francene Boyers M.D.   On: 10/07/2014 13:16   Ct Angio Chest Pe W/cm &/or Wo Cm  10/07/2014   CLINICAL DATA:  Shortness of breath and chest pressure since yesterday.  EXAM: CT ANGIOGRAPHY CHEST WITH CONTRAST  TECHNIQUE: Multidetector CT imaging of the chest was performed using the standard protocol during bolus administration of intravenous contrast. Multiplanar CT image reconstructions and MIPs were obtained to evaluate the vascular anatomy.  CONTRAST:  OMNIPAQUE IOHEXOL 350 MG/ML SOLN  COMPARISON:  08/21/2014   FINDINGS: There is adequate opacification of the pulmonary arteries. There is no pulmonary embolus. The main pulmonary artery, right main pulmonary artery and left main pulmonary arteries are normal in size. The heart size is normal. There is a small stable pericardial effusion. There is coronary artery atherosclerosis in the LAD.  There are small bilateral pleural effusions. There are patchy rounded areas of airspace disease in bilateral upper lobes, right lower lobe and to a lesser degree left lower lobe. There is no pneumothorax.  There is no axillary, hilar, or mediastinal adenopathy.  There is no lytic or blastic osseous lesion.  The visualized portions of the upper abdomen are unremarkable.  Review of the MIP images confirms the above findings.  IMPRESSION: 1. No evidence of pulmonary embolus. 2. There are patchy rounded areas of airspace disease in bilateral upper lobes, right lower lobe and to a lesser degree left lower lobe. The appearance is concerning for multilobar pneumonia including atypical infection and granulomatous infection. Alternatively, septic emboli are in the differential diagnosis.   Electronically Signed   By: Elige Ko   On: 10/07/2014 15:23     EKG Interpretation   Date/Time:  Monday October 07 2014 12:08:19 EDT Ventricular Rate:  99 PR Interval:  148 QRS Duration: 86 QT Interval:  378 QTC Calculation: 485 R Axis:   49 Text Interpretation:  Normal sinus rhythm Possible Left atrial enlargement  Nonspecific ST and T wave abnormality Prolonged QT Abnormal ECG Confirmed  by Lincoln Brigham [phone]) on 10/07/2014 12:31:41 PM      MDM   Final diagnoses:  Chest pain, unspecified chest pain type  HCAP (healthcare-associated pneumonia)  Essential hypertension   54 y.o. F here with chest pain and SOB.  Has been having intermittent episodes of this over the past year, current episode began yesterday afternoon and has been constant since this time.  Patient afebrile, non-toxic.   She is hypertensive-- vomited after taking BP meds this morning.  EKG NSR, no acute ischemic changes.  Lab work appears baseline for patient given her HD status, K+ WNL.  CXR with new small infiltrate seen only on lateral view.  Will obtain CTA to further  evaluate new infiltrate as well as assess for PE.  Patient given dose of coreg here in ED.  CTA chest negative for PE, however patchy rounded opacities concerning for multi-lobar pneumonia vs septic emboli.  Daughter does admit she has had pneumonia 3 months ago, never seemed to go back to baseline afterwards and was started on home O2 recently, not currently on abx.  Given patients ongoing symptoms, feel patient would benefit from admission.  Blood cultures obtained.  Will cover for HCAP given recent hospital admission.  Case discussed with hospitalist service, will admit for further management.  Temp admission orders placed.  VSS.  Garlon Hatchet, PA-C 10/07/14 1547  Tilden Fossa, MD 10/07/14 [phone]

## 2014-10-07 NOTE — ED Notes (Signed)
Meal tray at bedside.  

## 2014-10-07 NOTE — ED Notes (Signed)
Pt here with sob and pressure to the chest since yesterday.  Pt goes to HD t,th,sat., but has not missed any treatments.

## 2014-10-07 NOTE — ED Notes (Signed)
Hospitalist at bedside 

## 2014-10-07 NOTE — H&P (Signed)
[facility] History and Physical                                                                                    Joan Young, is a 54 y.o. female  MRN: [id number]   DOB - [birth year]  Admit Date - 10/07/2014  Outpatient Primary MD for the patient is Ambrose Finland, NP  Cardiologist:  Revonda Humphrey  Referring Physician:  Dr. Madilyn Hook   Chief Complaint:  Chest pressure Chief Complaint  Patient presents with  . Chest Pain  . Shortness of Breath     HPI  Joan Young  is a 54 y.o. female, with end-stage renal disease, diabetes mellitus, hypertension, and coronary artery disease who presents to the emergency department with chest pressure.  In the emergency department she was found to have multi lobar pneumonia. The patient speaks Spanish. Her daughter translates for her. The chest pressure is centrally located and started yesterday. It is worsened with any movement including speaking. It is improved with shifting positions and particularly lying on her left side. Nitroglycerin did not improve the pain. The chest pressure has been continuous in nature. It radiates to her back. She had similar chest pressure approximately 3 weeks ago when her blood pressure was too low and she suffered a presyncopal event in her medical doctor's office. She has been coughing for the past week her cough has progressively gotten worse. In the emergency department her resting O2 saturation is 85%. The patient does not normally use oxygen at home. She was hospitalized at [facility] last month. Here at [facility] with chest pain. A source was not found.  Her most recent cardiac cath was at [facility] in March 2016. She has chronic RCA occlusion.  She will be admitted for HCAP.  In the emergency department CT MG a chest is negative for PE but shows multi lobar pneumonia. WBC is 6.5. Potassium is 4.3. Troponin is 0.02.  Review of Systems   In addition to the HPI above,  No  Fever-chills, No problems swallowing food or Liquids, ++Chest pain, Cough and shortness of breath ++Diarrhea yesterday, none today++ one episode of vomiting this morning  No Blood in stool or Urine, No dysuria, No new skin rashes or bruises, No new joints pains-aches,  No new weakness, tingling, numbness in any extremity, No recent weight gain or loss, A full 10 point Review of Systems was done, except as stated above, all other Review of Systems were negative.  Past Medical History  Past Medical History  Diagnosis Date  . Obesity   . Macular degeneration disease   . Depression   . High cholesterol Dx 2005  . Hypertension Dx 7829  . Diabetes mellitus without complication DX 1969    type 19 since 54 years old but reports starting insulin at 54 years old  . Renal disorder   . Dialysis patient     Past Surgical History  Procedure Laterality Date  . Av fistula placement Left 12/19/2013    Procedure: Creation of Left Arm BRACHIOCEPHALIC ARTERIOVENOUS FISTULA ;  Surgeon: Fransisco Hertz, MD;  Location: [facility] OR;  Service: Vascular;  Laterality: Left;  . Back surgery        Social History History  Substance Use Topics  . Smoking status: Never Smoker   . Smokeless tobacco: Never Used  . Alcohol Use: No   lives at home with husband. Independent with ADLs.  Family History Family History  Problem Relation Age of Onset  . Hypertension Father   . Diabetes Father   . Kidney failure Father 50  . Kidney failure Sister 65  . Kidney failure Brother 30  . Kidney failure Sister 3    Prior to Admission medications   Medication Sig Start Date End Date Taking? Authorizing Provider  albuterol (PROVENTIL HFA;VENTOLIN HFA) 108 (90 BASE) MCG/ACT inhaler Inhale 2 puffs into the lungs every 6 (six) hours as needed for shortness of breath. And cough 07/08/14  Yes Ambrose Finland, NP  amLODipine (NORVASC) 10 MG tablet Take 10 mg by mouth daily.   Yes Historical Provider, MD  aspirin EC 81 MG tablet  Take 81 mg by mouth daily.   Yes Historical Provider, MD  carvedilol (COREG) 25 MG tablet Take 1 tablet (25 mg total) by mouth 2 (two) times daily with a meal. 05/01/14  Yes Ambrose Finland, NP  FLUoxetine (PROZAC) 20 MG capsule Take 1 capsule (20 mg total) by mouth daily. Patient taking differently: Take 40 mg by mouth daily.  08/27/14  Yes Ambrose Finland, NP  gabapentin (NEURONTIN) 100 MG capsule Take 1 capsule (100 mg total) by mouth 3 (three) times daily. Patient taking differently: Take 100 mg by mouth at bedtime.  08/27/14  Yes Ambrose Finland, NP  hydrALAZINE (APRESOLINE) 100 MG tablet TAKE 1 TABLET BY MOUTH 3 TIMES DAILY. 06/28/14  Yes Ambrose Finland, NP  insulin NPH Human (HUMULIN N,NOVOLIN N) 100 UNIT/ML injection Inject 0.08-0.24 mLs (8-24 Units total) into the skin 2 (two) times daily. 8 units in the morning and 24 units at after supper 05/01/14  Yes Ambrose Finland, NP  insulin regular (NOVOLIN R,HUMULIN R) 100 units/mL injection Inject 0.1-0.16 mLs (10-16 Units total) into the skin 3 (three) times daily before meals. 16 units every morning 16 units at lunch 10 units at bedtime.  Per sliding scale 05/01/14  Yes Ambrose Finland, NP  isosorbide mononitrate (IMDUR) 60 MG 24 hr tablet Take 1 tablet (60 mg total) by mouth daily. 09/10/14  Yes Rhetta Mura, MD  losartan (COZAAR) 50 MG tablet Take 50 mg by mouth daily.   Yes Historical Provider, MD  nitroGLYCERIN (NITROSTAT) 0.4 MG SL tablet Place 1 tablet (0.4 mg total) under the tongue every 5 (five) minutes as needed for chest pain. 09/10/14  Yes Rhetta Mura, MD  omeprazole (PRILOSEC) 20 MG capsule Take 1 capsule (20 mg total) by mouth daily. 05/01/14  Yes Ambrose Finland, NP  pravastatin (PRAVACHOL) 40 MG tablet Take 40 mg by mouth daily.   Yes Historical Provider, MD  ranitidine (ZANTAC) 150 MG tablet Take 1 tablet (150 mg total) by mouth daily. 08/27/14  Yes Ambrose Finland, NP  timolol (BETIMOL) 0.5 % ophthalmic solution Place 1 drop into the  right eye daily.   Yes Historical Provider, MD    Allergies  Allergen Reactions  . Hydromorphone Itching and Swelling  . Tape Rash    Physical Exam  Vitals  Blood pressure 199/78, pulse 94, temperature 98 F (36.7 C), temperature source Oral, resp. rate 24, SpO2 100 %.   General:  Obese, Hispanic female lying in bed in  NAD, daughter and husband at bedside  Psych:  Normal affect and insight, Not Suicidal or Homicidal, Awake Alert, Oriented X 3.  Neuro:   No F.N deficits, ALL C.Nerves Intact, Strength 5/5 all 4 extremities, Sensation intact all 4 extremities.  ENT:  Ears and Eyes appear Normal, Conjunctivae clear, PER. Moist oral mucosa without erythema or exudates.  Neck:  Supple, No lymphadenopathy appreciated  Respiratory:  Symmetrical chest wall movement, crackles at the bases with slight expiratory wheeze  Cardiac:  RRR, No Murmurs, no LE edema noted, no JVD.  Chest pain not reproducible on palpation or m/S exertion  Abdomen:  Positive bowel sounds, Soft, Non tender, Non distended,  No masses appreciated  Skin:  No Cyanosis, Normal Skin Turgor, No Skin Rash or Bruise.  Extremities:  Able to move all 4. 5/5 strength in each,  no effusions. Pulsating fistula in right arm  Data Review  CBC  Recent Labs Lab 10/07/14 1227  WBC 6.5  HGB 10.5*  HCT 30.6*  PLT 163  MCV 93.9  MCH 32.2  MCHC 34.3  RDW 14.2    Chemistries   Recent Labs Lab 10/07/14 1227  NA 137  K 4.3  CL 102  CO2 22  GLUCOSE 299*  BUN 40*  CREATININE 5.89*  CALCIUM 9.4    Imaging results:   Dg Chest 2 View  10/07/2014   CLINICAL DATA:  Shortness of breath.  Chest pain.  EXAM: CHEST  2 VIEW  COMPARISON:  09/09/2014 and 07/31/2014 and chest CT dated 08/21/2014  FINDINGS: There is new small patchy area of infiltrate at one of the lung bases seen on the lateral view. This is not apparent on the AP view.  There is chronic enlargement of the cardiac silhouette with the previously demonstrated  pericardial effusion. Pulmonary vascularity is normal. No pleural effusions. No osseous abnormality.  IMPRESSION: New small infiltrate posteriorly at 1 of the lung bases. Chronic enlargement of the cardiac silhouette at least in part due to a previously demonstrated pericardial effusion.   Electronically Signed   By: Francene Boyers M.D.   On: 10/07/2014 13:16   Dg Chest 2 View  09/09/2014   CLINICAL DATA:  54 year old female with history of left-sided chest pain, shoulder and neck pain for the past 2 days. Shortness of breath.  EXAM: CHEST  2 VIEW  COMPARISON:  Chest x-ray 07/31/2014.  FINDINGS: Lung volumes are normal. No consolidative airspace disease. No pleural effusions. Mild cephalization of the pulmonary vasculature, with mild diffuse peribronchial cuffing and slight indistinctness of interstitial markings. Mild cardiomegaly. Upper mediastinal contours are within normal limits. Atherosclerosis in the thoracic aorta.  IMPRESSION: 1. The appearance the chest suggests very mild congestive heart failure, as above.   Electronically Signed   By: Trudie Reed M.D.   On: 09/09/2014 18:52   Ct Angio Chest Pe W/cm &/or Wo Cm  10/07/2014   CLINICAL DATA:  Shortness of breath and chest pressure since yesterday.  EXAM: CT ANGIOGRAPHY CHEST WITH CONTRAST  TECHNIQUE: Multidetector CT imaging of the chest was performed using the standard protocol during bolus administration of intravenous contrast. Multiplanar CT image reconstructions and MIPs were obtained to evaluate the vascular anatomy.  CONTRAST:  OMNIPAQUE IOHEXOL 350 MG/ML SOLN  COMPARISON:  08/21/2014  FINDINGS: There is adequate opacification of the pulmonary arteries. There is no pulmonary embolus. The main pulmonary artery, right main pulmonary artery and left main pulmonary arteries are normal in size. The heart size is normal. There is  a small stable pericardial effusion. There is coronary artery atherosclerosis in the LAD.  There are small  bilateral pleural effusions. There are patchy rounded areas of airspace disease in bilateral upper lobes, right lower lobe and to a lesser degree left lower lobe. There is no pneumothorax.  There is no axillary, hilar, or mediastinal adenopathy.  There is no lytic or blastic osseous lesion.  The visualized portions of the upper abdomen are unremarkable.  Review of the MIP images confirms the above findings.  IMPRESSION: 1. No evidence of pulmonary embolus. 2. There are patchy rounded areas of airspace disease in bilateral upper lobes, right lower lobe and to a lesser degree left lower lobe. The appearance is concerning for multilobar pneumonia including atypical infection and granulomatous infection. Alternatively, septic emboli are in the differential diagnosis.   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 Will cycle cardiac enzymes. CT angiogram suggested septic emboli as an alternative diagnosis.  If her blood cultures are positive, would consider a TEE.  HCAP with hypoxia Patient with hospitalizations here and at [facility] in the last couple of months. Multilobar pneumonia seen on CT. Patient satting at 84% on room air. Order oxygen when necessary. We'll treat with Fortaz and vancomycin. Blood cultures, sputum cultures, urine for legionella and strep pneumo antigen, HIV are pending.  End stage renal disease Normally dialyzes at [facility] with Dr. Briant Cedar, nephrology  will see the patient in consultation on 8/9 and plan for dialysis  Hypertensive urgency Patient apparently vomited blood pressure medications this morning. We'll continue amlodipine, carvedilol, isosorbide mononitrate, Cozaar, hydralazine and add when necessary hydralazine.  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  Type 1 diabetes On regular insulin and NPH at home. Will shift to Lantus and NovoLog inpatient.  Coronary artery disease   continue 81 mg aspirin, and pravastatin.    Consultants Called:  Nephrology will see her 8/9  Family Communication:   Daughter and husband at bedside  Code Status:  Full code  Condition:  Guarded  Potential Disposition: To home in 3-4 days pending workup  Time spent in minutes : 60   Algis Downs,  PA-C on 10/07/2014 at 4:46 PM Between 7am to 7pm - Pager - [phone] After 7pm go to [email or web address] - password [email or web address] And look for the night coverage person covering me after hours  [facility]

## 2014-10-07 NOTE — ED Notes (Signed)
Vancomycin paused and flushed prior to hydralazine adminstation

## 2014-10-07 NOTE — ED Notes (Signed)
Attempted report x1. 

## 2014-10-07 NOTE — Progress Notes (Signed)
ANTIBIOTIC CONSULT NOTE - INITIAL  Pharmacy Consult for vanc/zosyn Indication: pneumonia  Allergies  Allergen Reactions  . Hydromorphone Itching and Swelling  . Tape Rash    Patient Measurements:     Vital Signs: Temp: 98 F (36.7 C) (08/08 1207) Temp Source: Oral (08/08 1207) BP: 199/78 mmHg (08/08 1619) Pulse Rate: 94 (08/08 1619) Intake/Output from previous day:   Intake/Output from this shift:    Labs:  Recent Labs  10/07/14 1227  WBC 6.5  HGB 10.5*  PLT 163  CREATININE 5.89*   CrCl cannot be calculated (Unknown ideal weight.). No results for input(s): VANCOTROUGH, VANCOPEAK, VANCORANDOM, GENTTROUGH, GENTPEAK, GENTRANDOM, TOBRATROUGH, TOBRAPEAK, TOBRARND, AMIKACINPEAK, AMIKACINTROU, AMIKACIN in the last 72 hours.   Microbiology: No results found for this or any previous visit (from the past 720 hour(s)).  Medical History: Past Medical History  Diagnosis Date  . Obesity   . Macular degeneration disease   . Depression   . High cholesterol Dx 2005  . Hypertension Dx 54  . Diabetes mellitus without complication DX 1969    type 62 since 54 years old but reports starting insulin at 54 years old  . Renal disorder   . Dialysis patient     Assessment: 54 yof with ESRD - HD TTS with HCAP. Pharmacy consulted to dose vanc/zosyn. Afebrile, wbc wnl - MD wants to change to ceftaz. 1x dose ordered and will follow up HD schedule  8/8 vanc>>  8/8 zosyn>>   8/8 BCx2>>   Goal of Therapy:  Vancomycin trough level 15-20 mcg/ml Gentamicin trough level <2 mcg/ml  Plan:  Vanc IV 2g x 1 dose - f/u HD schedule inpatient  Ceftaz 2g IV qHD -  Ordered 1x dose f/u HD schedule  Mon clinical progress, c/s, abx plan  F/u HD schedule inpatient for abx maint doses   Babs Bertin, PharmD Clinical Pharmacist Pager [phone] 10/07/2014 5:29 PM

## 2014-10-07 NOTE — ED Notes (Signed)
Admitting is aware of patients blood pressure

## 2014-10-07 NOTE — Progress Notes (Signed)
ANTIBIOTIC CONSULT NOTE - INITIAL  Pharmacy Consult for vanc/zosyn Indication: pneumonia  Allergies  Allergen Reactions  . Hydromorphone Itching and Swelling  . Tape Rash    Patient Measurements:     Vital Signs: Temp: 98 F (36.7 C) (08/08 1207) Temp Source: Oral (08/08 1207) BP: 199/78 mmHg (08/08 1619) Pulse Rate: 94 (08/08 1619) Intake/Output from previous day:   Intake/Output from this shift:    Labs:  Recent Labs  10/07/14 1227  WBC 6.5  HGB 10.5*  PLT 163  CREATININE 5.89*   CrCl cannot be calculated (Unknown ideal weight.). No results for input(s): VANCOTROUGH, VANCOPEAK, VANCORANDOM, GENTTROUGH, GENTPEAK, GENTRANDOM, TOBRATROUGH, TOBRAPEAK, TOBRARND, AMIKACINPEAK, AMIKACINTROU, AMIKACIN in the last 72 hours.   Microbiology: No results found for this or any previous visit (from the past 720 hour(s)).  Medical History: Past Medical History  Diagnosis Date  . Obesity   . Macular degeneration disease   . Depression   . High cholesterol Dx 2005  . Hypertension Dx 6045  . Diabetes mellitus without complication DX 1969    type 14 since 54 years old but reports starting insulin at 54 years old  . Renal disorder   . Dialysis patient     Assessment: 54 yof with ESRD - HD TTS with HCAP. Pharmacy consulted to dose vanc/zosyn. Afebrile, wbc wnl   8/8 vanc>>  8/8 zosyn>>   8/8 BCx2>>   Goal of Therapy:  Vancomycin trough level 15-20 mcg/ml Gentamicin trough level <2 mcg/ml  Plan:  Vanc IV 2g x 1 dose - f/u HD schedule inpatient  Zosyn 2.25g IV q8h  Mon clinical progress, c/s, abx plan  F/u HD schedule inpatient for abx maint doses   Babs Bertin, PharmD Clinical Pharmacist Pager [phone] 10/07/2014 4:31 PM

## 2014-10-08 ENCOUNTER — Other Ambulatory Visit (HOSPITAL_COMMUNITY): Payer: BLUE CROSS/BLUE SHIELD

## 2014-10-08 DIAGNOSIS — I5043 Acute on chronic combined systolic (congestive) and diastolic (congestive) heart failure: Secondary | ICD-10-CM

## 2014-10-08 LAB — COMPREHENSIVE METABOLIC PANEL WITH GFR
ALT: 14 U/L (ref 14–54)
AST: 17 U/L (ref 15–41)
Albumin: 2.8 g/dL — ABNORMAL LOW (ref 3.5–5.0)
Alkaline Phosphatase: 89 U/L (ref 38–126)
Anion gap: 12 (ref 5–15)
BUN: 49 mg/dL — ABNORMAL HIGH (ref 6–20)
CO2: 21 mmol/L — ABNORMAL LOW (ref 22–32)
Calcium: 8.5 mg/dL — ABNORMAL LOW (ref 8.9–10.3)
Chloride: 100 mmol/L — ABNORMAL LOW (ref 101–111)
Creatinine, Ser: 6.78 mg/dL — ABNORMAL HIGH (ref 0.44–1.00)
GFR calc Af Amer: 7 mL/min — ABNORMAL LOW
GFR calc non Af Amer: 6 mL/min — ABNORMAL LOW
Glucose, Bld: 204 mg/dL — ABNORMAL HIGH (ref 65–99)
Potassium: 4.3 mmol/L (ref 3.5–5.1)
Sodium: 133 mmol/L — ABNORMAL LOW (ref 135–145)
Total Bilirubin: 0.5 mg/dL (ref 0.3–1.2)
Total Protein: 5.4 g/dL — ABNORMAL LOW (ref 6.5–8.1)

## 2014-10-08 LAB — CBC
HEMATOCRIT: 25.3 % — AB (ref 36.0–46.0)
Hemoglobin: 8.6 g/dL — ABNORMAL LOW (ref 12.0–15.0)
MCH: 31.7 pg (ref 26.0–34.0)
MCHC: 34 g/dL (ref 30.0–36.0)
MCV: 93.4 fL (ref 78.0–100.0)
PLATELETS: 136 10*3/uL — AB (ref 150–400)
RBC: 2.71 MIL/uL — ABNORMAL LOW (ref 3.87–5.11)
RDW: 14.5 % (ref 11.5–15.5)
WBC: 5.1 10*3/uL (ref 4.0–10.5)

## 2014-10-08 LAB — HEMOGLOBIN A1C
Hgb A1c MFr Bld: 8.9 % — ABNORMAL HIGH (ref 4.8–5.6)
Mean Plasma Glucose: 209 mg/dL

## 2014-10-08 LAB — TROPONIN I: Troponin I: 0.04 ng/mL — ABNORMAL HIGH (ref <0.031)

## 2014-10-08 LAB — GLUCOSE, CAPILLARY
GLUCOSE-CAPILLARY: 118 mg/dL — AB (ref 65–99)
GLUCOSE-CAPILLARY: 158 mg/dL — AB (ref 65–99)
Glucose-Capillary: 191 mg/dL — ABNORMAL HIGH (ref 65–99)
Glucose-Capillary: 220 mg/dL — ABNORMAL HIGH (ref 65–99)

## 2014-10-08 LAB — LIPASE, BLOOD: Lipase: 32 U/L (ref 22–51)

## 2014-10-08 LAB — MRSA PCR SCREENING: MRSA by PCR: NEGATIVE

## 2014-10-08 LAB — STREP PNEUMONIAE URINARY ANTIGEN: Strep Pneumo Urinary Antigen: NEGATIVE

## 2014-10-08 LAB — HIV ANTIBODY (ROUTINE TESTING W REFLEX): HIV Screen 4th Generation wRfx: NONREACTIVE

## 2014-10-08 LAB — PHOSPHORUS: Phosphorus: 5.9 mg/dL — ABNORMAL HIGH (ref 2.5–4.6)

## 2014-10-08 MED ORDER — TRAMADOL HCL 50 MG PO TABS
25.0000 mg | ORAL_TABLET | Freq: Four times a day (QID) | ORAL | Status: DC | PRN
Start: 2014-10-08 — End: 2014-10-08
  Administered 2014-10-08: 25 mg via ORAL
  Filled 2014-10-08: qty 1

## 2014-10-08 MED ORDER — NEPRO/CARBSTEADY PO LIQD
237.0000 mL | Freq: Two times a day (BID) | ORAL | Status: DC
  Administered 2014-10-08 – 2014-10-10 (×4): 237 mL via ORAL
  Filled 2014-10-08 (×7): qty 237

## 2014-10-08 MED ORDER — CALCIUM CARBONATE 1250 (500 CA) MG PO TABS
1.0000 | ORAL_TABLET | Freq: Three times a day (TID) | ORAL | Status: DC
  Administered 2014-10-08 – 2014-10-10 (×5): 500 mg via ORAL
  Filled 2014-10-08 (×8): qty 1

## 2014-10-08 MED ORDER — INSULIN GLARGINE 100 UNIT/ML ~~LOC~~ SOLN
10.0000 [IU] | Freq: Every day | SUBCUTANEOUS | Status: DC
  Administered 2014-10-08 – 2014-10-09 (×2): 10 [IU] via SUBCUTANEOUS
  Filled 2014-10-08 (×3): qty 0.1

## 2014-10-08 MED ORDER — TRAMADOL HCL 50 MG PO TABS
50.0000 mg | ORAL_TABLET | Freq: Four times a day (QID) | ORAL | Status: DC | PRN
  Administered 2014-10-08 – 2014-10-09 (×4): 50 mg via ORAL
  Filled 2014-10-08 (×4): qty 1

## 2014-10-08 MED ORDER — LIDOCAINE 5 % EX PTCH
1.0000 | MEDICATED_PATCH | CUTANEOUS | Status: DC
  Administered 2014-10-08: 1 via TRANSDERMAL
  Filled 2014-10-08 (×3): qty 1

## 2014-10-08 MED ORDER — DEXTROSE 5 % IV SOLN
2.0000 g | INTRAVENOUS | Status: DC
  Administered 2014-10-08: 2 g via INTRAVENOUS
  Filled 2014-10-08 (×2): qty 2

## 2014-10-08 MED ORDER — DARBEPOETIN ALFA 100 MCG/0.5ML IJ SOSY: 100.0000 ug | PREFILLED_SYRINGE | INTRAMUSCULAR | Status: DC

## 2014-10-08 MED ORDER — VANCOMYCIN HCL IN DEXTROSE 750-5 MG/150ML-% IV SOLN
750.0000 mg | INTRAVENOUS | Status: DC
  Administered 2014-10-08: 750 mg via INTRAVENOUS
  Filled 2014-10-08 (×3): qty 150

## 2014-10-08 NOTE — Progress Notes (Signed)
[facility] PROGRESS NOTE  Joan Young BJY:[id number] DOB: [birth year] DOA: 10/07/2014 PCP: Ambrose Finland, NP  Assessment/Plan: Healthcare associated pneumonia/multilobar pneumonia with acute hypoxic respiratory failure Continue empiric antibiotic coverage with vancomycin and Fortaz, follow cultures, O2 via nasal cannula. Strep Pneumonia negative. Legionella antigen pending.  HIV pending.  Back pain , shoulder pain, suspect related to PNA.   Chest pressure Appears to be noncardiac and most likely pleuritic and associated with shortness of breath.  Probably related to PNA.  Troponin mildly elevated in setting of renal failure.  Check ECHO.   End-stage renal disease Scheduled dialysis today.  Nephrologist consulted.   ? Septic emboli on CT chest Appears unlikely as per symptoms. Follow blood cultures , positive or has persistent high fever and symptoms we'll consider TEE Will check ECHO.   Diabetes; poor oral intake. Discontinue meal coverage. Continue with SSI.  HB A1c 8.8 Decrease lantus to 15 units, patient with poor oral intake.   HTN,  We'll continue amlodipine, carvedilol, isosorbide mononitrate, Cozaar, hydralazine.  Follow BP and adjust dose as needed.   Code Status: Full code.  Family Communication: Care discussed with daughter.  Disposition Plan: Remain inpatient for treatment of PNA   Consultants:  Nephrology  Procedures:  ECHO  Antibiotics:  Ceftazidime 8-09  Vancomycin 8-8  HPI/Subjective: She is complaining of right shoulder blade pain, upper back, like band type of pain.  Breathing better than yesterday.    Objective: Filed Vitals:   10/08/14 1402  BP: 107/51  Pulse: 64  Temp:   Resp:     Intake/Output Summary (Last 24 hours) at 10/08/14 1430 Last data filed at 10/08/14 1221  Gross per 24 hour  Intake    410 ml  Output    875 ml  Net   -465 ml   Filed Weights   10/07/14 1902 10/08/14 0550  Weight: 81.784 kg (180 lb  4.8 oz) 80.2 kg (176 lb 12.9 oz)    Exam:   General:  Alert, in pain.   Cardiovascular: S 1, S 2 RRR  Respiratory: bilateral crackles.   Abdomen: bs present, soft, nt  Musculoskeletal: no edema  Data Reviewed: Basic Metabolic Panel:  Recent Labs Lab 10/07/14 1227 10/08/14 0514  NA 137 133*  K 4.3 4.3  CL 102 100*  CO2 22 21*  GLUCOSE 299* 204*  BUN 40* 49*  CREATININE 5.89* 6.78*  CALCIUM 9.4 8.5*   Liver Function Tests:  Recent Labs Lab 10/08/14 0514  AST 17  ALT 14  ALKPHOS 89  BILITOT 0.5  PROT 5.4*  ALBUMIN 2.8*   No results for input(s): LIPASE, AMYLASE in the last 168 hours. No results for input(s): AMMONIA in the last 168 hours. CBC:  Recent Labs Lab 10/07/14 1227 10/08/14 0514  WBC 6.5 5.1  HGB 10.5* 8.6*  HCT 30.6* 25.3*  MCV 93.9 93.4  PLT 163 136*   Cardiac Enzymes:  Recent Labs Lab 10/07/14 2039 10/07/14 2305 10/08/14 0514  TROPONINI 0.05* 0.04* 0.04*   BNP (last 3 results)  Recent Labs  03/31/14 0410  BNP 2127.3*    ProBNP (last 3 results)  Recent Labs  12/12/13 1351  PROBNP 12460.0*    CBG:  Recent Labs Lab 10/07/14 1817 10/07/14 2150 10/08/14 0642 10/08/14 1211  GLUCAP 144* 204* 191* 118*    Recent Results (from the past 240 hour(s))  MRSA PCR Screening     Status: None   Collection Time: 10/08/14  1:11 AM  Result Value Ref  Range Status   MRSA by PCR NEGATIVE NEGATIVE Final    Comment:        The GeneXpert MRSA Assay (FDA approved for NASAL specimens only), is one component of a comprehensive MRSA colonization surveillance program. It is not intended to diagnose MRSA infection nor to guide or monitor treatment for MRSA infections.      Studies: Dg Chest 2 View  10/07/2014   CLINICAL DATA:  Shortness of breath.  Chest pain.  EXAM: CHEST  2 VIEW  COMPARISON:  09/09/2014 and 07/31/2014 and chest CT dated 08/21/2014  FINDINGS: There is new small patchy area of infiltrate at one of the lung  bases seen on the lateral view. This is not apparent on the AP view.  There is chronic enlargement of the cardiac silhouette with the previously demonstrated pericardial effusion. Pulmonary vascularity is normal. No pleural effusions. No osseous abnormality.  IMPRESSION: New small infiltrate posteriorly at 1 of the lung bases. Chronic enlargement of the cardiac silhouette at least in part due to a previously demonstrated pericardial effusion.   Electronically Signed   By: Francene Boyers M.D.   On: 10/07/2014 13:16   Ct Angio Chest Pe W/cm &/or Wo Cm  10/07/2014   CLINICAL DATA:  Shortness of breath and chest pressure since yesterday.  EXAM: CT ANGIOGRAPHY CHEST WITH CONTRAST  TECHNIQUE: Multidetector CT imaging of the chest was performed using the standard protocol during bolus administration of intravenous contrast. Multiplanar CT image reconstructions and MIPs were obtained to evaluate the vascular anatomy.  CONTRAST:  OMNIPAQUE IOHEXOL 350 MG/ML SOLN  COMPARISON:  08/21/2014  FINDINGS: There is adequate opacification of the pulmonary arteries. There is no pulmonary embolus. The main pulmonary artery, right main pulmonary artery and left main pulmonary arteries are normal in size. The heart size is normal. There is a small stable pericardial effusion. There is coronary artery atherosclerosis in the LAD.  There are small bilateral pleural effusions. There are patchy rounded areas of airspace disease in bilateral upper lobes, right lower lobe and to a lesser degree left lower lobe. There is no pneumothorax.  There is no axillary, hilar, or mediastinal adenopathy.  There is no lytic or blastic osseous lesion.  The visualized portions of the upper abdomen are unremarkable.  Review of the MIP images confirms the above findings.  IMPRESSION: 1. No evidence of pulmonary embolus. 2. There are patchy rounded areas of airspace disease in bilateral upper lobes, right lower lobe and to a lesser degree left lower lobe.  The appearance is concerning for multilobar pneumonia including atypical infection and granulomatous infection. Alternatively, septic emboli are in the differential diagnosis.   Electronically Signed   By: Elige Ko   On: 10/07/2014 15:23    Scheduled Meds: . amLODipine  10 mg Oral Daily  . aspirin EC  81 mg Oral Daily  . calcium carbonate  1 tablet Oral TID WC  . carvedilol  25 mg Oral BID WC  . cefTAZidime (FORTAZ)  IV  2 g Intravenous Q T,Th,Sat-1800  . [START ON 10/15/2014] darbepoetin (ARANESP) injection - DIALYSIS  100 mcg Intravenous Q Tue-HD  . famotidine  20 mg Oral QHS  . feeding supplement (NEPRO CARB STEADY)  237 mL Oral BID BM  . FLUoxetine  40 mg Oral Daily  . gabapentin  100 mg Oral QHS  . heparin  5,000 Units Subcutaneous 3 times per day  . hydrALAZINE  100 mg Oral 3 times per day  . insulin aspart  0-15 Units Subcutaneous TID WC  . insulin aspart  3 Units Subcutaneous TID WC  . insulin glargine  25 Units Subcutaneous QHS  . isosorbide mononitrate  60 mg Oral Daily  . lidocaine  1 patch Transdermal Q24H  . losartan  50 mg Oral Daily  . pantoprazole  40 mg Oral Daily  . pravastatin  40 mg Oral Daily  . sodium chloride  3 mL Intravenous Q12H  . sodium chloride  3 mL Intravenous Q12H  . timolol  1 drop Right Eye Daily  . 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     Hartley Barefoot A  [facility] Pager [phone]. If 7PM-7AM, please contact night-coverage at [email or web address], password [facility] 10/08/2014, 2:30 PM  LOS: 1 day

## 2014-10-08 NOTE — Progress Notes (Signed)
PT Cancellation Note  Patient Details Name: Joan Young MRN: [id number] DOB: [birth year]   Cancelled Treatment:    Reason Eval/Treat Not Completed: Medical issues which prohibited therapy. Spoke with MD and RN who requested to hold off on seeing pt at this time. Will check back tomorrow.   SMITH,KAREN LUBECK 10/08/2014, 11:59 AM

## 2014-10-08 NOTE — Progress Notes (Signed)
Inpatient Diabetes Program Recommendations  AACE/ADA: New Consensus Statement on Inpatient Glycemic Control (2013)  Target Ranges:  Prepandial:   less than 140 mg/dL      Peak postprandial:   less than 180 mg/dL (1-2 hours)      Critically ill patients:  140 - 180 mg/dL   Results for JENISIS, HARMSEN (MRN [id number]) as of 10/08/2014 08:29  Ref. Range 10/07/2014 18:17 10/07/2014 21:50  Glucose-Capillary Latest Ref Range: 65-99 mg/dL 409 (H) 811 (H)   Outpatient Diabetes medications: NPH 8 units QAM, NPH 24 units QPM, Novolin R 16 units with breakfast and lunch, Novolin R 10 units with supper, plus sliding scale TID with meals Current orders for Inpatient glycemic control: Lantus 25 units QHS, Novolog 0-15 units TID with meals, Novolog 3 units TID with meals for meal coverage  Inpatient Diabetes Program Recommendations Insulin - Basal: Please consider increasing Lantus to 28 units QHS. Correction (SSI): Please consider adding Novolog bedtime correction scale.  Thanks, Orlando Penner, RN, MSN, CCRN, CDE Diabetes Coordinator Inpatient Diabetes Program [phone] (Team Pager from 8am to 5pm) [phone] ([facility] office) [phone] [facility] [place] office) [phone] [facility] office)

## 2014-10-08 NOTE — Consult Note (Signed)
Joan Young is an 54 y.o. female referred by Dr Sunnie Nielsen   Chief Complaint: ESRD, Anemia, sec HPTH HPI: 54yo hispanic female admitted yest for PNA.  Has [facility] sec DM and on HD at [facility] T[facility] with last tx SAt.  She is compliant with her dialysis.  Past Medical History  Diagnosis Date  . Obesity   . Macular degeneration disease   . Depression   . High cholesterol Dx 2005  . Hypertension Dx 9604  . Diabetes mellitus without complication DX 1969    type 80 since 54 years old but reports starting insulin at 54 years old  . Renal disorder   . Dialysis patient     Past Surgical History  Procedure Laterality Date  . Av fistula placement Left 12/19/2013    Procedure: Creation of Left Arm BRACHIOCEPHALIC ARTERIOVENOUS FISTULA ;  Surgeon: Fransisco Hertz, MD;  Location: [facility] OR;  Service: Vascular;  Laterality: Left;  . Back surgery      Family History  Problem Relation Age of Onset  . Hypertension Father   . Diabetes Father   . Kidney failure Father 86  . Kidney failure Sister 3  . Kidney failure Brother 30  . Kidney failure Sister 64  Tells me only hx CKD is in Aunt due to DM  Social History:  reports that she has never smoked. She has never used smokeless tobacco. She reports that she does not drink alcohol or use illicit drugs.  Lives with daughter in [place]  Allergies:  Allergies  Allergen Reactions  . Hydromorphone Itching and Swelling  . Tape Rash    Medications Prior to Admission  Medication Sig Dispense Refill  . albuterol (PROVENTIL HFA;VENTOLIN HFA) 108 (90 BASE) MCG/ACT inhaler Inhale 2 puffs into the lungs every 6 (six) hours as needed for shortness of breath. And cough 1 Inhaler 2  . amLODipine (NORVASC) 10 MG tablet Take 10 mg by mouth daily.    [place] Kitchen aspirin EC 81 MG tablet Take 81 mg by mouth daily.    . carvedilol (COREG) 25 MG tablet Take 1 tablet (25 mg total) by mouth 2 (two) times daily with a meal. 60 tablet 3  . FLUoxetine (PROZAC) 20 MG capsule  Take 1 capsule (20 mg total) by mouth daily. (Patient taking differently: Take 40 mg by mouth daily. ) 30 capsule 2  . gabapentin (NEURONTIN) 100 MG capsule Take 1 capsule (100 mg total) by mouth 3 (three) times daily. (Patient taking differently: Take 100 mg by mouth at bedtime. ) 180 capsule 0  . hydrALAZINE (APRESOLINE) 100 MG tablet TAKE 1 TABLET BY MOUTH 3 TIMES DAILY. 90 tablet 0  . insulin NPH Human (HUMULIN N,NOVOLIN N) 100 UNIT/ML injection Inject 0.08-0.24 mLs (8-24 Units total) into the skin 2 (two) times daily. 8 units in the morning and 24 units at after supper 10 mL 3  . insulin regular (NOVOLIN R,HUMULIN R) 100 units/mL injection Inject 0.1-0.16 mLs (10-16 Units total) into the skin 3 (three) times daily before meals. 16 units every morning 16 units at lunch 10 units at bedtime.  Per sliding scale 10 mL 3  . isosorbide mononitrate (IMDUR) 60 MG 24 hr tablet Take 1 tablet (60 mg total) by mouth daily. 30 tablet 0  . losartan (COZAAR) 50 MG tablet Take 50 mg by mouth daily.    . nitroGLYCERIN (NITROSTAT) 0.4 MG SL tablet Place 1 tablet (0.4 mg total) under the tongue every 5 (five) minutes as  needed for chest pain. 30 tablet 0  . omeprazole (PRILOSEC) 20 MG capsule Take 1 capsule (20 mg total) by mouth daily. 30 capsule 2  . pravastatin (PRAVACHOL) 40 MG tablet Take 40 mg by mouth daily.    . ranitidine (ZANTAC) 150 MG tablet Take 1 tablet (150 mg total) by mouth daily. 60 tablet 3  . timolol (BETIMOL) 0.5 % ophthalmic solution Place 1 drop into the right eye daily.       Lab Results: UA: ND   Recent Labs  10/07/14 1227 10/08/14 0514  WBC 6.5 5.1  HGB 10.5* 8.6*  HCT 30.6* 25.3*  PLT 163 136*   BMET  Recent Labs  10/07/14 1227 10/08/14 0514  NA 137 133*  K 4.3 4.3  CL 102 100*  CO2 22 21*  GLUCOSE 299* 204*  BUN 40* 49*  CREATININE 5.89* 6.78*  CALCIUM 9.4 8.5*   LFT  Recent Labs  10/08/14 0514  PROT 5.4*  ALBUMIN 2.8*  AST 17  ALT 14  ALKPHOS 89   BILITOT 0.5   Dg Chest 2 View  10/07/2014   CLINICAL DATA:  Shortness of breath.  Chest pain.  EXAM: CHEST  2 VIEW  COMPARISON:  09/09/2014 and 07/31/2014 and chest CT dated 08/21/2014  FINDINGS: There is new small patchy area of infiltrate at one of the lung bases seen on the lateral view. This is not apparent on the AP view.  There is chronic enlargement of the cardiac silhouette with the previously demonstrated pericardial effusion. Pulmonary vascularity is normal. No pleural effusions. No osseous abnormality.  IMPRESSION: New small infiltrate posteriorly at 1 of the lung bases. Chronic enlargement of the cardiac silhouette at least in part due to a previously demonstrated pericardial effusion.   Electronically Signed   By: Francene Boyers M.D.   On: 10/07/2014 13:16   Ct Angio Chest Pe W/cm &/or Wo Cm  10/07/2014   CLINICAL DATA:  Shortness of breath and chest pressure since yesterday.  EXAM: CT ANGIOGRAPHY CHEST WITH CONTRAST  TECHNIQUE: Multidetector CT imaging of the chest was performed using the standard protocol during bolus administration of intravenous contrast. Multiplanar CT image reconstructions and MIPs were obtained to evaluate the vascular anatomy.  CONTRAST:  OMNIPAQUE IOHEXOL 350 MG/ML SOLN  COMPARISON:  08/21/2014  FINDINGS: There is adequate opacification of the pulmonary arteries. There is no pulmonary embolus. The main pulmonary artery, right main pulmonary artery and left main pulmonary arteries are normal in size. The heart size is normal. There is a small stable pericardial effusion. There is coronary artery atherosclerosis in the LAD.  There are small bilateral pleural effusions. There are patchy rounded areas of airspace disease in bilateral upper lobes, right lower lobe and to a lesser degree left lower lobe. There is no pneumothorax.  There is no axillary, hilar, or mediastinal adenopathy.  There is no lytic or blastic osseous lesion.  The visualized portions of the upper  abdomen are unremarkable.  Review of the MIP images confirms the above findings.  IMPRESSION: 1. No evidence of pulmonary embolus. 2. There are patchy rounded areas of airspace disease in bilateral upper lobes, right lower lobe and to a lesser degree left lower lobe. The appearance is concerning for multilobar pneumonia including atypical infection and granulomatous infection. Alternatively, septic emboli are in the differential diagnosis.   Electronically Signed   By: Elige Ko   On: 10/07/2014 15:23    ROS: No change in vision Breathing better Pain Lt  low/mid back, worse with inspiration No CP No Abd pain No edema No arthritic CO  PHYSICAL EXAM: Blood pressure 168/67, pulse 67, temperature 98 F (36.7 C), temperature source Oral, resp. rate 18, weight 80.2 kg (176 lb 12.9 oz), SpO2 98 %. HEENT: PERRLA EOMI NECK:No JVD LUNGS:Few faint crackles Lt base CARDIAC:RRR wo MRG ABD:+ BS NTND No HSM EXT:No edema.  RUA AVF + bruit NEURO:CNI Ox3 no asterixis  Assessment: 1. HCAP 2. ESRD 3. Anemia 4. Sec HPTH 5. HTN 6. DM PLAN: 1. HD today 2. Aranesp 3. Check PO4 4. Resume Ca based PO4 binder 5. Renal diet   Laila Myhre T 10/08/2014, 12:28 PM

## 2014-10-09 ENCOUNTER — Other Ambulatory Visit (HOSPITAL_COMMUNITY): Payer: BLUE CROSS/BLUE SHIELD

## 2014-10-09 ENCOUNTER — Inpatient Hospital Stay (HOSPITAL_COMMUNITY): Payer: BLUE CROSS/BLUE SHIELD

## 2014-10-09 DIAGNOSIS — Z992 Dependence on renal dialysis: Secondary | ICD-10-CM

## 2014-10-09 DIAGNOSIS — R079 Chest pain, unspecified: Secondary | ICD-10-CM

## 2014-10-09 DIAGNOSIS — J189 Pneumonia, unspecified organism: Principal | ICD-10-CM

## 2014-10-09 DIAGNOSIS — R0600 Dyspnea, unspecified: Secondary | ICD-10-CM

## 2014-10-09 DIAGNOSIS — E1029 Type 1 diabetes mellitus with other diabetic kidney complication: Secondary | ICD-10-CM

## 2014-10-09 DIAGNOSIS — N186 End stage renal disease: Secondary | ICD-10-CM

## 2014-10-09 DIAGNOSIS — I10 Essential (primary) hypertension: Secondary | ICD-10-CM

## 2014-10-09 LAB — GLUCOSE, CAPILLARY
GLUCOSE-CAPILLARY: 239 mg/dL — AB (ref 65–99)
Glucose-Capillary: 81 mg/dL (ref 65–99)
Glucose-Capillary: 212 mg/dL — ABNORMAL HIGH (ref 65–99)

## 2014-10-09 LAB — CBC
HCT: 26.4 % — ABNORMAL LOW (ref 36.0–46.0)
HEMOGLOBIN: 9 g/dL — AB (ref 12.0–15.0)
MCH: 31.7 pg (ref 26.0–34.0)
MCHC: 34.1 g/dL (ref 30.0–36.0)
MCV: 93 fL (ref 78.0–100.0)
Platelets: 140 10*3/uL — ABNORMAL LOW (ref 150–400)
RBC: 2.84 MIL/uL — ABNORMAL LOW (ref 3.87–5.11)
RDW: 14.4 % (ref 11.5–15.5)
WBC: 4.1 10*3/uL (ref 4.0–10.5)

## 2014-10-09 LAB — BASIC METABOLIC PANEL
Anion gap: 8 (ref 5–15)
BUN: 19 mg/dL (ref 6–20)
CALCIUM: 8.7 mg/dL — AB (ref 8.9–10.3)
CO2: 31 mmol/L (ref 22–32)
CREATININE: 4.2 mg/dL — AB (ref 0.44–1.00)
Chloride: 97 mmol/L — ABNORMAL LOW (ref 101–111)
GFR calc Af Amer: 13 mL/min — ABNORMAL LOW (ref >60)
GFR calc non Af Amer: 11 mL/min — ABNORMAL LOW (ref >60)
Glucose, Bld: 239 mg/dL — ABNORMAL HIGH (ref 65–99)
Potassium: 3.7 mmol/L (ref 3.5–5.1)
Sodium: 136 mmol/L (ref 135–145)

## 2014-10-09 LAB — LEGIONELLA ANTIGEN, URINE

## 2014-10-09 MED ORDER — ONDANSETRON HCL 4 MG/2ML IJ SOLN
4.0000 mg | Freq: Four times a day (QID) | INTRAMUSCULAR | Status: DC | PRN
  Administered 2014-10-09: 4 mg via INTRAVENOUS
  Filled 2014-10-09: qty 2

## 2014-10-09 MED ORDER — GUAIFENESIN ER 600 MG PO TB12
600.0000 mg | ORAL_TABLET | Freq: Two times a day (BID) | ORAL | Status: DC
  Administered 2014-10-09 – 2014-10-10 (×3): 600 mg via ORAL
  Filled 2014-10-09 (×4): qty 1

## 2014-10-09 MED ORDER — PROCHLORPERAZINE EDISYLATE 5 MG/ML IJ SOLN
10.0000 mg | Freq: Four times a day (QID) | INTRAMUSCULAR | Status: DC | PRN
  Administered 2014-10-09: 10 mg via INTRAVENOUS
  Filled 2014-10-09 (×3): qty 2

## 2014-10-09 MED ORDER — DIPHENHYDRAMINE HCL 25 MG PO CAPS
25.0000 mg | ORAL_CAPSULE | Freq: Three times a day (TID) | ORAL | Status: DC | PRN
  Filled 2014-10-09: qty 1

## 2014-10-09 NOTE — Progress Notes (Signed)
S: Tolerated HD last night O:BP 145/54 mmHg  Pulse 75  Temp(Src) 97.2 F (36.2 C) (Oral)  Resp 18  Wt 80.087 kg (176 lb 9 oz)  SpO2 98%  Intake/Output Summary (Last 24 hours) at 10/09/14 0935 Last data filed at 10/09/14 0200  Gross per 24 hour  Intake    600 ml  Output   3925 ml  Net  -3325 ml   Weight change: 2.316 kg (5 lb 1.7 oz) ZOX:WRUEA and alert CVS:RRR Resp: clear Abd:+ BS NTND Ext:No edema  RUA AVF + bruit NEURO:CNI, Ox3 No asterixis   . amLODipine  10 mg Oral Daily  . aspirin EC  81 mg Oral Daily  . calcium carbonate  1 tablet Oral TID WC  . carvedilol  25 mg Oral BID WC  . cefTAZidime (FORTAZ)  IV  2 g Intravenous Q T,Th,Sat-1800  . [START ON 10/15/2014] darbepoetin (ARANESP) injection - DIALYSIS  100 mcg Intravenous Q Tue-HD  . famotidine  20 mg Oral QHS  . feeding supplement (NEPRO CARB STEADY)  237 mL Oral BID BM  . FLUoxetine  40 mg Oral Daily  . gabapentin  100 mg Oral QHS  . guaiFENesin  600 mg Oral BID  . heparin  5,000 Units Subcutaneous 3 times per day  . hydrALAZINE  100 mg Oral 3 times per day  . insulin aspart  0-15 Units Subcutaneous TID WC  . insulin glargine  10 Units Subcutaneous QHS  . isosorbide mononitrate  60 mg Oral Daily  . lidocaine  1 patch Transdermal Q24H  . losartan  50 mg Oral Daily  . pantoprazole  40 mg Oral Daily  . pravastatin  40 mg Oral Daily  . sodium chloride  3 mL Intravenous Q12H  . sodium chloride  3 mL Intravenous Q12H  . timolol  1 drop Right Eye Daily  . vancomycin  750 mg Intravenous Q T,Th,Sa-HD   Dg Chest 2 View  10/07/2014   CLINICAL DATA:  Shortness of breath.  Chest pain.  EXAM: CHEST  2 VIEW  COMPARISON:  09/09/2014 and 07/31/2014 and chest CT dated 08/21/2014  FINDINGS: There is new small patchy area of infiltrate at one of the lung bases seen on the lateral view. This is not apparent on the AP view.  There is chronic enlargement of the cardiac silhouette with the previously demonstrated pericardial  effusion. Pulmonary vascularity is normal. No pleural effusions. No osseous abnormality.  IMPRESSION: New small infiltrate posteriorly at 1 of the lung bases. Chronic enlargement of the cardiac silhouette at least in part due to a previously demonstrated pericardial effusion.   Electronically Signed   By: Francene Boyers M.D.   On: 10/07/2014 13:16   Ct Angio Chest Pe W/cm &/or Wo Cm  10/07/2014   CLINICAL DATA:  Shortness of breath and chest pressure since yesterday.  EXAM: CT ANGIOGRAPHY CHEST WITH CONTRAST  TECHNIQUE: Multidetector CT imaging of the chest was performed using the standard protocol during bolus administration of intravenous contrast. Multiplanar CT image reconstructions and MIPs were obtained to evaluate the vascular anatomy.  CONTRAST:  OMNIPAQUE IOHEXOL 350 MG/ML SOLN  COMPARISON:  08/21/2014  FINDINGS: There is adequate opacification of the pulmonary arteries. There is no pulmonary embolus. The main pulmonary artery, right main pulmonary artery and left main pulmonary arteries are normal in size. The heart size is normal. There is a small stable pericardial effusion. There is coronary artery atherosclerosis in the LAD.  There are small bilateral pleural  effusions. There are patchy rounded areas of airspace disease in bilateral upper lobes, right lower lobe and to a lesser degree left lower lobe. There is no pneumothorax.  There is no axillary, hilar, or mediastinal adenopathy.  There is no lytic or blastic osseous lesion.  The visualized portions of the upper abdomen are unremarkable.  Review of the MIP images confirms the above findings.  IMPRESSION: 1. No evidence of pulmonary embolus. 2. There are patchy rounded areas of airspace disease in bilateral upper lobes, right lower lobe and to a lesser degree left lower lobe. The appearance is concerning for multilobar pneumonia including atypical infection and granulomatous infection. Alternatively, septic emboli are in the differential  diagnosis.   Electronically Signed   By: Elige Ko   On: 10/07/2014 15:23   BMET    Component Value Date/Time   NA 136 10/09/2014 0401   K 3.7 10/09/2014 0401   CL 97* 10/09/2014 0401   CO2 31 10/09/2014 0401   GLUCOSE 239* 10/09/2014 0401   BUN 19 10/09/2014 0401   CREATININE 4.20* 10/09/2014 0401   CREATININE 6.37* 08/12/2014 1248   CALCIUM 8.7* 10/09/2014 0401   GFRNONAA 11* 10/09/2014 0401   GFRNONAA 8* 12/31/2013 1121   GFRAA 13* 10/09/2014 0401   GFRAA 9* 12/31/2013 1121   CBC    Component Value Date/Time   WBC 4.1 10/09/2014 0401   RBC 2.84* 10/09/2014 0401   HGB 9.0* 10/09/2014 0401   HCT 26.4* 10/09/2014 0401   PLT 140* 10/09/2014 0401   MCV 93.0 10/09/2014 0401   MCH 31.7 10/09/2014 0401   MCHC 34.1 10/09/2014 0401   RDW 14.4 10/09/2014 0401   LYMPHSABS 0.7 09/10/2014 1435   MONOABS 0.3 09/10/2014 1435   EOSABS 0.2 09/10/2014 1435   BASOSABS 0.0 09/10/2014 1435     Assessment:  1. ESRD  TTS at [facility] 2. HCAP 3. Anemia on aranesp 4. Sec HPTH not requiring Vit D 5. HTN 6. DM  Plan: 1. HD tomorrow   Yadier Bramhall T

## 2014-10-09 NOTE — Progress Notes (Signed)
UR completed 

## 2014-10-09 NOTE — Progress Notes (Signed)
Inpatient Diabetes Program Recommendations  AACE/ADA: New Consensus Statement on Inpatient Glycemic Control (2013)  Target Ranges:  Prepandial:   less than 140 mg/dL      Peak postprandial:   less than 180 mg/dL (1-2 hours)      Critically ill patients:  140 - 180 mg/dL   Results for Joan Young, Joan Young (MRN [id number]) as of 10/09/2014 09:37  Ref. Range 10/08/2014 06:42 10/08/2014 12:11 10/08/2014 15:36 10/08/2014 22:21 10/09/2014 05:56  Glucose-Capillary Latest Ref Range: 65-99 mg/dL 409 (H) 811 (H) 914 (H) 220 (H) 239 (H)   Diabetes history: DM type 1 Outpatient Diabetes medications: NPH 8 units QAM, NPH 24 units QPM, Novolin R 16 units with breakfast and lunch, Novolin R 10 units with supper, plus sliding scale TID with meals Current orders for Inpatient glycemic control: Lantus 10 units QHS, Novolog 0-15 units TID with meals  Inpatient Diabetes Program Recommendations Insulin - Basal: Patient received 25 units of basal 2 days ago, fasting in the 190's, Please consider increasing Lantus back up to 28 units QHS. Correction (SSI): Please consider adding Novolog bedtime correction scale.   Note: Unsure why basal decreased to 10 units last pm as patient is a type 1 DM. Fasting glucose is 239 mg/dl this am.  Thanks,  Christena Deem RN, MSN, [facility] Inpatient Diabetes Coordinator Team Pager [phone]

## 2014-10-09 NOTE — Progress Notes (Addendum)
[facility] PROGRESS NOTE  Joan Young JXB:[id number] DOB: 04-03-1960 DOA: 10/07/2014 PCP: Ambrose Finland, NP  Assessment/Plan: Healthcare associated pneumonia/multilobar pneumonia with acute hypoxic respiratory failure -Continue empiric antibiotic coverage with vancomycin and Fortaz, follow cultures, O2 via nasal cannula. -Strep Pneumonia and Legionella antigen neg   -HIV neg -Will add mucinex and flutter valve -will assess O2 needs on ambulation -after another 24 hours will switch to PO antibiotics  Chest pressure -Appears to be noncardiac and most likely pleuritic and associated with PNA -Troponin mildly elevated in setting of ESRD.  -follow ECHO.  -denies CP on today;s exam (8/10)  End-stage renal disease -Nephrologist consulted.  -continue HD treatment -patient complaining of current outpatient HD unit and care from nephrologist   ? Septic emboli on CT chest -Appears unlikely as per symptoms. Follow blood cultures -Will follow ECHO.   Diabetes type 1 with nephropathy  -poor oral intake. Discontinue meal coverage. Continue with SSI.  -HB A1c 8.8 -Nepro added to nutrition regimen   HTN  -Will continue amlodipine, carvedilol, isosorbide mononitrate, Cozaar, hydralazine. -BP stable -volume to be controlled by HD  Chronic combined systolic and diastolic heart failure -on good medications for her heart -will follow repeated echo ordered -volume to be controlled with HD -educated regarding importance on daily weights and low sodium diet  -patient might need adjustment to dry weight in outpatient settings.   Code Status: Full code.  Family Communication: Care discussed with daughter at bedside.  Disposition Plan: Remain inpatient for treatment of PNA and HD   Consultants:  Nephrology  Procedures:  ECHO: pending   Antibiotics:  Ceftazidime 8-09  Vancomycin 8-8  HPI/Subjective: She is complaining of HA's, decrease appetite and N/V. No CP. Still  with some orthopnea.   Objective: Filed Vitals:   10/09/14 0823  BP: 145/54  Pulse: 75  Temp:   Resp:     Intake/Output Summary (Last 24 hours) at 10/09/14 1257 Last data filed at 10/09/14 0900  Gross per 24 hour  Intake    720 ml  Output   3400 ml  Net  -2680 ml   Filed Weights   10/08/14 1819 10/08/14 2149 10/09/14 0523  Weight: 84.1 kg (185 lb 6.5 oz) 80.7 kg (177 lb 14.6 oz) 80.087 kg (176 lb 9 oz)    Exam:   General:  Alert, awake and oriented. Patient complaining of N/V and decrease appetite. Also with HA's and still with some SOB and mild orthopnea.   Cardiovascular: S1, S2, RRR, no rubs or gallops  Respiratory: mild crackles at bases, no wheezing, scattered rhonchi   Abdomen: bs present, soft, nt  Musculoskeletal: no LE edema, no cyanosis   Data Reviewed: Basic Metabolic Panel:  Recent Labs Lab 10/07/14 1227 10/08/14 0514 10/08/14 1627 10/09/14 0401  NA 137 133*  --  136  K 4.3 4.3  --  3.7  CL 102 100*  --  97*  CO2 22 21*  --  31  GLUCOSE 299* 204*  --  239*  BUN 40* 49*  --  19  CREATININE 5.89* 6.78*  --  4.20*  CALCIUM 9.4 8.5*  --  8.7*  PHOS  --   --  5.9*  --    Liver Function Tests:  Recent Labs Lab 10/08/14 0514  AST 17  ALT 14  ALKPHOS 89  BILITOT 0.5  PROT 5.4*  ALBUMIN 2.8*    Recent Labs Lab 10/08/14 1627  LIPASE 32   CBC:  Recent Labs Lab  10/07/14 1227 10/08/14 0514 10/09/14 0401  WBC 6.5 5.1 4.1  HGB 10.5* 8.6* 9.0*  HCT 30.6* 25.3* 26.4*  MCV 93.9 93.4 93.0  PLT 163 136* 140*   Cardiac Enzymes:  Recent Labs Lab 10/07/14 2039 10/07/14 2305 10/08/14 0514  TROPONINI 0.05* 0.04* 0.04*   BNP (last 3 results)  Recent Labs  03/31/14 0410  BNP 2127.3*    ProBNP (last 3 results)  Recent Labs  12/12/13 1351  PROBNP 12460.0*    CBG:  Recent Labs Lab 10/08/14 1211 10/08/14 1536 10/08/14 2221 10/09/14 0556 10/09/14 1121  GLUCAP 118* 158* 220* 239* 81    Recent Results (from the past  240 hour(s))  Culture, blood (routine x 2)     Status: None (Preliminary result)   Collection Time: 10/07/14  4:05 PM  Result Value Ref Range Status   Specimen Description BLOOD LEFT ARM  Final   Special Requests BOTTLES DRAWN AEROBIC AND ANAEROBIC 5CC  Final   Culture NO GROWTH < 24 HOURS  Final   Report Status PENDING  Incomplete  Culture, blood (routine x 2)     Status: None (Preliminary result)   Collection Time: 10/07/14  4:10 PM  Result Value Ref Range Status   Specimen Description BLOOD LEFT HAND  Final   Special Requests BOTTLES DRAWN AEROBIC ONLY 10CC  Final   Culture NO GROWTH < 24 HOURS  Final   Report Status PENDING  Incomplete  MRSA PCR Screening     Status: None   Collection Time: 10/08/14  1:11 AM  Result Value Ref Range Status   MRSA by PCR NEGATIVE NEGATIVE Final    Comment:        The GeneXpert MRSA Assay (FDA approved for NASAL specimens only), is one component of a comprehensive MRSA colonization surveillance program. It is not intended to diagnose MRSA infection nor to guide or monitor treatment for MRSA infections.      Studies: Ct Angio Chest Pe W/cm &/or Wo Cm  10/07/2014   CLINICAL DATA:  Shortness of breath and chest pressure since yesterday.  EXAM: CT ANGIOGRAPHY CHEST WITH CONTRAST  TECHNIQUE: Multidetector CT imaging of the chest was performed using the standard protocol during bolus administration of intravenous contrast. Multiplanar CT image reconstructions and MIPs were obtained to evaluate the vascular anatomy.  CONTRAST:  OMNIPAQUE IOHEXOL 350 MG/ML SOLN  COMPARISON:  08/21/2014  FINDINGS: There is adequate opacification of the pulmonary arteries. There is no pulmonary embolus. The main pulmonary artery, right main pulmonary artery and left main pulmonary arteries are normal in size. The heart size is normal. There is a small stable pericardial effusion. There is coronary artery atherosclerosis in the LAD.  There are small bilateral pleural  effusions. There are patchy rounded areas of airspace disease in bilateral upper lobes, right lower lobe and to a lesser degree left lower lobe. There is no pneumothorax.  There is no axillary, hilar, or mediastinal adenopathy.  There is no lytic or blastic osseous lesion.  The visualized portions of the upper abdomen are unremarkable.  Review of the MIP images confirms the above findings.  IMPRESSION: 1. No evidence of pulmonary embolus. 2. There are patchy rounded areas of airspace disease in bilateral upper lobes, right lower lobe and to a lesser degree left lower lobe. The appearance is concerning for multilobar pneumonia including atypical infection and granulomatous infection. Alternatively, septic emboli are in the differential diagnosis.   Electronically Signed   By: Elige Ko  On: 10/07/2014 15:23    Scheduled Meds: . amLODipine  10 mg Oral Daily  . aspirin EC  81 mg Oral Daily  . calcium carbonate  1 tablet Oral TID WC  . carvedilol  25 mg Oral BID WC  . cefTAZidime (FORTAZ)  IV  2 g Intravenous Q T,Th,Sat-1800  . [START ON 10/15/2014] darbepoetin (ARANESP) injection - DIALYSIS  100 mcg Intravenous Q Tue-HD  . famotidine  20 mg Oral QHS  . feeding supplement (NEPRO CARB STEADY)  237 mL Oral BID BM  . FLUoxetine  40 mg Oral Daily  . gabapentin  100 mg Oral QHS  . guaiFENesin  600 mg Oral BID  . heparin  5,000 Units Subcutaneous 3 times per day  . hydrALAZINE  100 mg Oral 3 times per day  . insulin aspart  0-15 Units Subcutaneous TID WC  . insulin glargine  10 Units Subcutaneous QHS  . isosorbide mononitrate  60 mg Oral Daily  . lidocaine  1 patch Transdermal Q24H  . losartan  50 mg Oral Daily  . pantoprazole  40 mg Oral Daily  . pravastatin  40 mg Oral Daily  . sodium chloride  3 mL Intravenous Q12H  . sodium chloride  3 mL Intravenous Q12H  . timolol  1 drop Right Eye Daily  . 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     Vassie Loll  [facility] Pager [phone]. If 7PM-7AM, please contact night-coverage at [email or web address], password [facility] 10/09/2014, 12:57 PM  LOS: 2 days

## 2014-10-09 NOTE — Evaluation (Signed)
Physical Therapy Evaluation Patient Details Name: Joan Young MRN: [id number] DOB: 11-19-1960 Today's Date: 10/09/2014   History of Present Illness  54 year old Hispanic female with end-stage renal disease on dialysis, uncontrolled diabetes mellitus, hypertension and coronary artery disease presented to the ED with chest pressure with CT angiogram chest showing bilateral pneumonia.  Clinical Impression  Pt admitted with above diagnosis. Pt currently with functional limitations due to the deficits listed below (see PT Problem List).  Pt will benefit from skilled PT to increase their independence and safety with mobility to allow discharge to the venue listed below.  Pt presents with overall weakness and decreased balance with gait, with 1 LOB and "weak" knees with gait.  O2 dropped to 86% on room air.  Recommend RW and BSC due to BR being upstairs. Also recommend HHPT.     Follow Up Recommendations Home health PT    Equipment Recommendations  Rolling walker with 5" wheels;3in1 (PT)    Recommendations for Other Services OT consult     Precautions / Restrictions Precautions Precautions: Fall      Mobility  Bed Mobility Overal bed mobility: Modified Independent                Transfers Overall transfer level: Needs assistance Equipment used: Rolling walker (2 wheeled);Straight cane Transfers: Sit to/from Stand Sit to Stand: Min assist         General transfer comment: MIN A to power up and for steadying self.  Ambulation/Gait Ambulation/Gait assistance: Min assist;Mod assist Ambulation Distance (Feet): 40 Feet (x2) Assistive device: Rolling walker (2 wheeled);Straight cane Gait Pattern/deviations: Decreased step length - right;Decreased step length - left;Staggering right;Staggering left     General Gait Details: Pt ambulated first gait session with cane and on RA. Pt demonstrating decreased functional strength with increased knee flexion. O2 decreased to  86%.  Second gait trial with RW with MIN A with 1 episode of stagger to the R with LOB and required MOD A.  o2 99% on 2 L/min.  Stairs            Wheelchair Mobility    Modified Rankin (Stroke Patients Only)       Balance Overall balance assessment: Needs assistance   Sitting balance-Leahy Scale: Fair       Standing balance-Leahy Scale: Poor                               Pertinent Vitals/Pain Pain Assessment: 0-10 Pain Score: 7  Pain Location: headache Pain Descriptors / Indicators: Headache Pain Intervention(s): Premedicated before session    Home Living Family/patient expects to be discharged to:: Private residence Living Arrangements: Children Available Help at Discharge: Available 24 hours/day;Family Type of Home: House Home Access: Stairs to enter Entrance Stairs-Rails: Lawyer of Steps: 4 Home Layout: Multi-level Home Equipment: Cane - single point;Shower seat      Prior Function Level of Independence: Independent with assistive device(s)               Hand Dominance   Dominant Hand: Right    Extremity/Trunk Assessment   Upper Extremity Assessment: Generalized weakness           Lower Extremity Assessment: Generalized weakness      Cervical / Trunk Assessment: Normal  Communication   Communication: Interpreter utilized (daughter)  Cognition Arousal/Alertness: Awake/alert Behavior During Therapy: WFL for tasks assessed/performed Overall Cognitive Status: Within Functional Limits for tasks assessed  General Comments General comments (skin integrity, edema, etc.): Pt feeling ill today with nausea and overall fatigue.    Exercises        Assessment/Plan    PT Assessment Patient needs continued PT services  PT Diagnosis Difficulty walking;Generalized weakness   PT Problem List Decreased strength;Decreased activity tolerance;Decreased balance;Decreased  mobility;Decreased knowledge of use of DME  PT Treatment Interventions Gait training;Stair training;Functional mobility training;Therapeutic activities;Therapeutic exercise;Balance training;DME instruction   PT Goals (Current goals can be found in the Care Plan section) Acute Rehab PT Goals Patient Stated Goal: To feel better.   PT Goal Formulation: With patient/family Time For Goal Achievement: 10/23/14 Potential to Achieve Goals: Good    Frequency Min 3X/week   Barriers to discharge        Co-evaluation               End of Session Equipment Utilized During Treatment: Gait belt Activity Tolerance: Patient limited by fatigue Patient left: in bed;with call bell/phone within reach;with family/visitor present Nurse Communication: Mobility status;Other (comment) (o2)         Time: 6962-9528 PT Time Calculation (min) (ACUTE ONLY): 24 min   Charges:   PT Evaluation $Initial PT Evaluation Tier I: 1 Procedure PT Treatments $Gait Training: 8-22 mins   PT G Codes:        Kaeleen Odom LUBECK 10/09/2014, 11:50 AM

## 2014-10-09 NOTE — Progress Notes (Signed)
Echocardiogram 2D Echocardiogram has been performed.  Joan Young 10/09/2014, 12:13 PM

## 2014-10-09 NOTE — Progress Notes (Signed)
Pt a/o, spanish speaking female, had c/o HA and N/V this am, PRN pain meds and antiemetics given, after pt had nap, pt stated her HA is gone and has not had any c/o N/V, VSS, pt has poor po intake and states she has not had much of an appetite since she started on HD, pt stable

## 2014-10-10 DIAGNOSIS — I5042 Chronic combined systolic (congestive) and diastolic (congestive) heart failure: Secondary | ICD-10-CM

## 2014-10-10 DIAGNOSIS — R0902 Hypoxemia: Secondary | ICD-10-CM

## 2014-10-10 DIAGNOSIS — E785 Hyperlipidemia, unspecified: Secondary | ICD-10-CM

## 2014-10-10 LAB — CBC
HCT: 24.8 % — ABNORMAL LOW (ref 36.0–46.0)
Hemoglobin: 8.3 g/dL — ABNORMAL LOW (ref 12.0–15.0)
MCH: 31.8 pg (ref 26.0–34.0)
MCHC: 33.5 g/dL (ref 30.0–36.0)
MCV: 95 fL (ref 78.0–100.0)
Platelets: 126 10*3/uL — ABNORMAL LOW (ref 150–400)
RBC: 2.61 MIL/uL — AB (ref 3.87–5.11)
RDW: 14.4 % (ref 11.5–15.5)
WBC: 3.8 10*3/uL — ABNORMAL LOW (ref 4.0–10.5)

## 2014-10-10 LAB — RENAL FUNCTION PANEL
Albumin: 2.9 g/dL — ABNORMAL LOW (ref 3.5–5.0)
Anion gap: 10 (ref 5–15)
BUN: 33 mg/dL — ABNORMAL HIGH (ref 6–20)
CALCIUM: 8.5 mg/dL — AB (ref 8.9–10.3)
CO2: 30 mmol/L (ref 22–32)
CREATININE: 6.78 mg/dL — AB (ref 0.44–1.00)
Chloride: 94 mmol/L — ABNORMAL LOW (ref 101–111)
GFR calc Af Amer: 7 mL/min — ABNORMAL LOW (ref >60)
GFR calc non Af Amer: 6 mL/min — ABNORMAL LOW (ref >60)
Glucose, Bld: 248 mg/dL — ABNORMAL HIGH (ref 65–99)
Phosphorus: 6.7 mg/dL — ABNORMAL HIGH (ref 2.5–4.6)
Potassium: 4.1 mmol/L (ref 3.5–5.1)
Sodium: 134 mmol/L — ABNORMAL LOW (ref 135–145)

## 2014-10-10 LAB — GLUCOSE, CAPILLARY
GLUCOSE-CAPILLARY: 105 mg/dL — AB (ref 65–99)
Glucose-Capillary: 244 mg/dL — ABNORMAL HIGH (ref 65–99)
Glucose-Capillary: 216 mg/dL — ABNORMAL HIGH (ref 65–99)

## 2014-10-10 MED ORDER — SACCHAROMYCES BOULARDII 250 MG PO CAPS: 250.0000 mg | ORAL_CAPSULE | Freq: Two times a day (BID) | ORAL | Status: DC

## 2014-10-10 MED ORDER — AMOXICILLIN-POT CLAVULANATE 500-125 MG PO TABS
1.0000 | ORAL_TABLET | Freq: Two times a day (BID) | ORAL | Status: DC
  Administered 2014-10-10: 500 mg via ORAL
  Filled 2014-10-10 (×2): qty 1

## 2014-10-10 MED ORDER — CALCIUM CARBONATE 1250 (500 CA) MG PO TABS: 1.0000 | ORAL_TABLET | Freq: Three times a day (TID) | ORAL | Status: AC

## 2014-10-10 MED ORDER — NEPRO/CARBSTEADY PO LIQD: 237.0000 mL | Freq: Two times a day (BID) | ORAL | Status: AC

## 2014-10-10 MED ORDER — ONDANSETRON 8 MG PO TBDP: 8.0000 mg | ORAL_TABLET | Freq: Three times a day (TID) | ORAL | Status: DC | PRN

## 2014-10-10 MED ORDER — AMOXICILLIN-POT CLAVULANATE 500-125 MG PO TABS: 1.0000 | ORAL_TABLET | Freq: Two times a day (BID) | ORAL | Status: DC

## 2014-10-10 MED ORDER — AMOXICILLIN-POT CLAVULANATE 500-125 MG PO TABS: 1.0000 | ORAL_TABLET | Freq: Two times a day (BID) | ORAL | Status: AC

## 2014-10-10 MED ORDER — ONDANSETRON 8 MG PO TBDP: 8.0000 mg | ORAL_TABLET | Freq: Three times a day (TID) | ORAL | Status: AC | PRN

## 2014-10-10 MED ORDER — CALCIUM CARBONATE 1250 (500 CA) MG PO TABS: 1.0000 | ORAL_TABLET | Freq: Three times a day (TID) | ORAL | Status: DC

## 2014-10-10 MED ORDER — FLUOXETINE HCL 40 MG PO CAPS: 40.0000 mg | ORAL_CAPSULE | Freq: Every day | ORAL | Status: DC

## 2014-10-10 MED ORDER — SACCHAROMYCES BOULARDII 250 MG PO CAPS: 250.0000 mg | ORAL_CAPSULE | Freq: Two times a day (BID) | ORAL | Status: AC

## 2014-10-10 MED ORDER — GUAIFENESIN ER 600 MG PO TB12: 600.0000 mg | ORAL_TABLET | Freq: Two times a day (BID) | ORAL | Status: DC

## 2014-10-10 MED ORDER — RANITIDINE HCL 150 MG PO TABS: 150.0000 mg | ORAL_TABLET | Freq: Every day | ORAL | Status: DC

## 2014-10-10 MED ORDER — AMOXICILLIN-POT CLAVULANATE 500-125 MG PO TABS: 1.0000 | ORAL_TABLET | Freq: Every day | ORAL | Status: DC

## 2014-10-10 NOTE — Care Management Note (Addendum)
Case Management Note  Patient Details  Name: Joan Young MRN: [id number] Date of Birth: 09-25-1960  Subjective/Objective:             PNA       Action/Plan: Outpatient Physical Therapy   Expected Discharge Date:  10/10/2014               Expected Discharge Plan:  Home/Self Care  In-House Referral:     Discharge planning Services  CM Consult  Post Acute Care Choice:    Choice offered to:     DME Arranged:  Walker rolling DME Agency:  [facility]   Status of Service:  Completed, signed off  Medicare Important Message Given:    Date Medicare IM Given:    Medicare IM give by:    Date Additional Medicare IM Given:    Additional Medicare Important Message give by:     If discussed at Long Length of Stay Meetings, dates discussed:    Additional Comments: NCM spoke to dtr, [place]. Pt is non-english speaking pt. Dtr states her pt prefers outpt PT. Will send referral to [facility] Outpatient PT referral. Provided dt with contact number and explained they will contact to arrange appt. Pt received RW and 3n1 for home from [facility] after hours. Has oxygen concentrator at home. She has brought her portable for home.    Elliot Cousin, RN 10/10/2014, 5:51 PM

## 2014-10-10 NOTE — Progress Notes (Signed)
Pt being dc to home with family, pt being dc with out pt PT, pt stable

## 2014-10-10 NOTE — Discharge Summary (Signed)
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 Discharge home with DME's to facilitate ADL's and arrangements for outpatient PT  Diet recommendation: heart healthy and low carbohydrates diet  Filed Weights   10/10/14 0629 10/10/14 0845 10/10/14 1215  Weight: 81.375 kg (179 lb 6.4 oz) 81.2 kg (179 lb 0.2 oz) 79.8 kg (175 lb 14.8 oz)    History of present illness:  54 y.o. female, with end-stage renal disease, diabetes mellitus, hypertension, and coronary artery disease who presents to the emergency department with chest pressure. In the emergency department she was found to have multi lobar pneumonia. The patient speaks Spanish. Her daughter translates for her. The chest pressure is centrally located and started yesterday. It is worsened with any movement including speaking. It is improved with shifting positions and particularly lying on her left side. Nitroglycerin did not improve the pain. The chest pressure has been continuous in nature. It radiates to her back. She had similar chest pressure approximately 3 weeks ago when her blood  pressure was too low and she suffered a presyncopal event in her medical doctor's office. She has been coughing for the past week her cough has progressively gotten worse. In the emergency department her resting O2 saturation is 85%. The patient does not normally use oxygen at home. She was hospitalized at [facility] last month. Here at [facility] with chest pain. A source was not found. Her most recent cardiac cath was at [facility] in March 2016.  Admitted for tx of HCAP  Hospital Course:  Healthcare associated pneumonia/multilobar pneumonia with acute hypoxic respiratory failure -After 3 days of broad spectrum antibiotics her therapy was exchanged to PO regimen using augmentin and will treat until completing a total of 10 days therapy -Supplementation of O2 via nasal cannula arranged at discharge -Strep Pneumonia and Legionella antigen neg  -HIV neg -Will also use mucinex and flutter valve -follow up with PCP in 10 days  Chest pressure -Appears to be noncardiac and most likely pleuritic and associated with PNA -Troponin mildly elevated in setting of ESRD.  -ECHO w/o wall motion abnormalities.  -denies any CP for the last today of hospitalization on exam (8/10--8/11)  End-stage renal disease -Nephrologist consulted.  -HD treatment continued during admission -patient complaining of current outpatient HD unit and care from outpatient nephrologist  -will recommend dry weight to be lower to 79.5 Kg  Diabetes type 1 with nephropathy  -poor oral intake overall. -HB A1c 8.8 -Nepro added to nutrition regimen  -continue insulin therapy  HTN  -Will continue amlodipine, carvedilol, isosorbide mononitrate, Cozaar, hydralazine. -BP stable overall -volume to be controlled by HD and with that even better BP control -advise to follow  low sodium diet  Chronic combined systolic and diastolic heart failure -on good medications for her heart, which would be continue -advise to  follow low sodium diet and to check her weight on daily basis -EF 45-50% with grade 2 diastolic heart failure -volume to be controlled with HD; Patient might need adjustment to dry weight in outpatient settings. -(recommending to lower dry weight to 79.5 Kg)  Physical deconditioning -Outpatient PT has been recommended -will also arrange for Walker and 3 N 1 DME's  Procedures:  See below for x-ray reports   2-D echo: - Left ventricle: The cavity size was normal. There was mild concentric hypertrophy. Systolic function was mildly reduced. The estimated ejection fraction was in the range of 45% to 50%. Mild diffuse hypokinesis with no identifiable regional variations. Features are consistent with a pseudonormal left ventricular filling pattern, with concomitant abnormal relaxation and increased filling pressure (grade 2 diastolic dysfunction). - Mitral valve: There was mild to moderate regurgitation directed centrally. - Left atrium: The atrium was mildly dilated. - Pericardium, extracardiac: A small pericardial effusion was identified posterior to the heart and circumferential to the heart. There was no evidence of hemodynamic compromise.  Consultations:  Renal service   Discharge Exam: Filed Vitals:   10/10/14 1516  BP: 119/42  Pulse: 73  Temp: 97.8 F (36.6 C)  Resp:     General: Alert, awake and oriented. Patient with improvement in her condition overall. just feeling weak and still requiring some oxygen supplementation as her O2 drops with activity into 86-87%. Denies orthopnea.   Cardiovascular: S1, S2, RRR, no rubs or gallops  Respiratory: no frank Crackles, no wheezing, scattered rhonchi   Abdomen: bs present, soft, nt  Musculoskeletal: no LE edema, no cyanosis   Discharge Instructions   Discharge Instructions    Diet - low sodium heart healthy    Complete by:  As directed      Discharge instructions    Complete by:  As directed    Take medications as prescribed Arrange follow up with PCP in 10 days Please use oxygen as prescribed and continue using flutter valve as instructed Follow low sodium diet (less than 2 gram daily) Your dry weight has been adjust 1 Kg down (new dry weight 79.5 Kg); please check your weight on daily basis          Current Discharge Medication List    START taking these medications   Details  amoxicillin-clavulanate (AUGMENTIN) 500-125 MG per tablet Take 1 tablet (500 mg total) by mouth every 12 (twelve) hours. Qty: 16 tablet, Refills: 0    calcium carbonate (OS-CAL - DOSED IN MG OF ELEMENTAL CALCIUM) 1250 (500 CA) MG tablet Take 1 tablet (500 mg of elemental calcium total) by mouth 3 (three) times daily with meals. Qty: 90 tablet, Refills: 1    guaiFENesin (MUCINEX) 600 MG 12 hr tablet Take 1 tablet (600 mg total) by mouth 2 (two) times daily. Qty: 30 tablet, Refills: 0    Nutritional Supplements (FEEDING SUPPLEMENT, NEPRO CARB STEADY,) LIQD Take 237 mLs by mouth 2 (two) times daily between meals. Refills: 0    ondansetron (ZOFRAN ODT) 8 MG disintegrating tablet Take 1 tablet (8 mg total) by mouth every 8 (eight) hours as needed for nausea or vomiting. Qty: 30 tablet, Refills: 0    saccharomyces boulardii (FLORASTOR) 250 MG capsule Take 1 capsule (250 mg total) by mouth 2 (two) times daily. Qty: 60 capsule, Refills: 1      CONTINUE these  medications which have CHANGED   Details  FLUoxetine (PROZAC) 40 MG capsule Take 1 capsule (40 mg total) by mouth daily. Qty: 30 capsule, Refills: 0    ranitidine (ZANTAC) 150 MG tablet Take 1 tablet (150 mg total) by mouth at bedtime.      CONTINUE these medications which have NOT CHANGED   Details  albuterol (PROVENTIL HFA;VENTOLIN HFA) 108 (90 BASE) MCG/ACT inhaler Inhale 2 puffs into the lungs every 6 (six) hours as needed for shortness of breath. And cough Qty: 1 Inhaler, Refills: 2   Associated Diagnoses: Cough    amLODipine (NORVASC)  10 MG tablet Take 10 mg by mouth daily.    aspirin EC 81 MG tablet Take 81 mg by mouth daily.    carvedilol (COREG) 25 MG tablet Take 1 tablet (25 mg total) by mouth 2 (two) times daily with a meal. Qty: 60 tablet, Refills: 3   Associated Diagnoses: Acute on chronic combined systolic and diastolic congestive heart failure    gabapentin (NEURONTIN) 100 MG capsule Take 1 capsule (100 mg total) by mouth 3 (three) times daily. Qty: 180 capsule, Refills: 0    hydrALAZINE (APRESOLINE) 100 MG tablet TAKE 1 TABLET BY MOUTH 3 TIMES DAILY. Qty: 90 tablet, Refills: 0    insulin NPH Human (HUMULIN N,NOVOLIN N) 100 UNIT/ML injection Inject 0.08-0.24 mLs (8-24 Units total) into the skin 2 (two) times daily. 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 16 units every morning 16 units at lunch 10 units at bedtime.  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 Qty: 30 tablet, Refills: 0    losartan (COZAAR) 50 MG tablet Take 50 mg by mouth daily.    nitroGLYCERIN (NITROSTAT) 0.4 MG SL tablet Place 1 tablet (0.4 mg total) under the tongue every 5 (five) minutes as needed for chest pain. Qty: 30 tablet, Refills: 0    omeprazole (PRILOSEC) 20 MG capsule Take 1 capsule (20 mg total) by mouth daily. Qty: 30 capsule, Refills: 2    pravastatin (PRAVACHOL) 40 MG tablet Take 40 mg by mouth daily.    timolol (BETIMOL) 0.5 % ophthalmic solution Place 1 drop into the right eye daily.       Allergies  Allergen Reactions  . Hydromorphone Itching and Swelling   . Tape Rash   Follow-up Information    Follow up with Ambrose Finland, NP. Schedule an appointment as soon as possible for a visit in 10 days.   Specialty:  Internal Medicine   Contact information:   [address] [place] [address] [phone]        The results of significant diagnostics from this hospitalization (including imaging, microbiology, ancillary and laboratory) are listed below for reference.    Significant Diagnostic Studies: Dg Chest 2 View  10/07/2014   CLINICAL DATA:  Shortness of breath.  Chest pain.  EXAM: CHEST  2 VIEW  COMPARISON:  09/09/2014 and 07/31/2014 and chest CT dated 08/21/2014  FINDINGS: There is new small patchy area of infiltrate at one of the lung  bases seen on the lateral view. This is not apparent on the AP view.  There is chronic enlargement of the cardiac silhouette with the previously demonstrated pericardial effusion. Pulmonary vascularity is normal. No pleural effusions. No osseous abnormality.  IMPRESSION: New small infiltrate posteriorly at 1 of the lung bases. Chronic enlargement of the cardiac silhouette at least in part due to a previously demonstrated pericardial effusion.   Electronically Signed   By: Francene Boyers M.D.   On: 10/07/2014 13:16   Ct Angio Chest Pe W/cm &/or Wo Cm  10/07/2014   CLINICAL DATA:  Shortness of breath and chest pressure since yesterday.  EXAM: CT ANGIOGRAPHY CHEST WITH CONTRAST  TECHNIQUE: Multidetector CT imaging of the chest was performed using the standard protocol during bolus administration of intravenous contrast. Multiplanar CT image reconstructions and MIPs were obtained to evaluate the vascular anatomy.  CONTRAST:  OMNIPAQUE IOHEXOL 350 MG/ML SOLN  COMPARISON:  08/21/2014  FINDINGS: There is adequate opacification of the pulmonary arteries. There is no pulmonary embolus. The main pulmonary artery, right main pulmonary artery and left main pulmonary arteries are normal in size. The heart size is  normal. There is a small stable pericardial effusion. There is coronary artery atherosclerosis in the LAD.  There are small bilateral pleural effusions. There are patchy rounded areas of airspace disease in bilateral upper lobes, right lower lobe and to a lesser degree left lower lobe. There is no pneumothorax.  There is no axillary, hilar, or mediastinal adenopathy.  There is no lytic or blastic osseous lesion.  The visualized portions of the upper abdomen are unremarkable.  Review of the MIP images confirms the above findings.  IMPRESSION: 1. No evidence of pulmonary embolus. 2. There are patchy rounded areas of airspace disease in bilateral upper lobes, right lower lobe and to a lesser degree left lower lobe. The appearance is concerning for multilobar pneumonia including atypical infection and granulomatous infection. Alternatively, septic emboli are in the differential diagnosis.   Electronically Signed   By: Elige Ko   On: 10/07/2014 15:23    Microbiology: Recent Results (from the past 240 hour(s))  Culture, blood (routine x 2)     Status: None (Preliminary result)   Collection Time: 10/07/14  4:05 PM  Result Value Ref Range Status   Specimen Description BLOOD LEFT ARM  Final   Special Requests BOTTLES DRAWN AEROBIC AND ANAEROBIC 5CC  Final   Culture NO GROWTH 3 DAYS  Final   Report Status PENDING  Incomplete  Culture, blood (routine x 2)     Status: None (Preliminary result)   Collection Time: 10/07/14  4:10 PM  Result Value Ref Range Status   Specimen Description BLOOD LEFT HAND  Final   Special Requests BOTTLES DRAWN AEROBIC ONLY 10CC  Final   Culture NO GROWTH 3 DAYS  Final   Report Status PENDING  Incomplete  MRSA PCR Screening     Status: None   Collection Time: 10/08/14  1:11 AM  Result Value Ref Range Status   MRSA by PCR NEGATIVE NEGATIVE Final    Comment:        The GeneXpert MRSA Assay (FDA approved for NASAL specimens only), is one component of a comprehensive MRSA  colonization surveillance program. It is not intended to diagnose MRSA infection nor to guide or monitor treatment for MRSA infections.      Labs: Basic Metabolic Panel:  Recent Labs Lab 10/07/14 1227 10/08/14 1610 10/08/14 1627 10/09/14 0401 10/10/14 9604  NA 137 133*  --  136 134*  K 4.3 4.3  --  3.7 4.1  CL 102 100*  --  97* 94*  CO2 22 21*  --  31 30  GLUCOSE 299* 204*  --  239* 248*  BUN 40* 49*  --  19 33*  CREATININE 5.89* 6.78*  --  4.20* 6.78*  CALCIUM 9.4 8.5*  --  8.7* 8.5*  PHOS  --   --  5.9*  --  6.7*   Liver Function Tests:  Recent Labs Lab 10/08/14 0514 10/10/14 0555  AST 17  --   ALT 14  --   ALKPHOS 89  --   BILITOT 0.5  --   PROT 5.4*  --   ALBUMIN 2.8* 2.9*    Recent Labs Lab 10/08/14 1627  LIPASE 32   CBC:  Recent Labs Lab 10/07/14 1227 10/08/14 0514 10/09/14 0401 10/10/14 0555  WBC 6.5 5.1 4.1 3.8*  HGB 10.5* 8.6* 9.0* 8.3*  HCT 30.6* 25.3* 26.4* 24.8*  MCV 93.9 93.4 93.0 95.0  PLT 163 136* 140* 126*   Cardiac Enzymes:  Recent Labs Lab 10/07/14 2039 10/07/14 2305 10/08/14 0514  TROPONINI 0.05* 0.04* 0.04*   BNP: BNP (last 3 results)  Recent Labs  03/31/14 0410  BNP 2127.3*    ProBNP (last 3 results)  Recent Labs  12/12/13 1351  PROBNP 12460.0*    CBG:  Recent Labs Lab 10/09/14 1121 10/09/14 1607 10/09/14 2122 10/10/14 0717 10/10/14 1316  GLUCAP 81 212* 244* 216* 105*     Signed:  Vassie Loll  [facility] 10/10/2014, 3:56 PM

## 2014-10-10 NOTE — Procedures (Signed)
Pt seen on HD.  BFR 350,  Ap 80.  SBP 134.  Tolerating HD well so far.

## 2014-10-10 NOTE — Progress Notes (Signed)
Pt a/o, spanish speaking female, pt had HD today and tolerated well, upon return pt c/o HA, PRN Tylenol given as ordered, VSS, pt stable

## 2014-10-12 LAB — CULTURE, BLOOD (ROUTINE X 2)
Culture: NO GROWTH
Culture: NO GROWTH

## 2014-10-16 ENCOUNTER — Telehealth: Payer: Self-pay | Admitting: Internal Medicine

## 2014-10-16 NOTE — Telephone Encounter (Signed)
Patient's family member called requesting PCP change, patient states her mother is in depression and does not want to continue with dialysis, patient states she feels that the reason patient does not want to go is due to distance. Patient is aware of upcoming appt and will keep it but is requesting to get PCP change after this visit.  Please f/u

## 2014-10-18 LAB — HM COLONOSCOPY

## 2014-10-21 ENCOUNTER — Ambulatory Visit: Payer: BLUE CROSS/BLUE SHIELD | Admitting: Internal Medicine

## 2014-10-30 ENCOUNTER — Encounter: Payer: Self-pay | Admitting: Internal Medicine

## 2014-10-30 ENCOUNTER — Ambulatory Visit: Payer: BLUE CROSS/BLUE SHIELD | Attending: Internal Medicine | Admitting: Internal Medicine

## 2014-10-30 DIAGNOSIS — I120 Hypertensive chronic kidney disease with stage 5 chronic kidney disease or end stage renal disease: Secondary | ICD-10-CM | POA: Insufficient documentation

## 2014-10-30 DIAGNOSIS — E1065 Type 1 diabetes mellitus with hyperglycemia: Secondary | ICD-10-CM | POA: Insufficient documentation

## 2014-10-30 DIAGNOSIS — I10 Essential (primary) hypertension: Secondary | ICD-10-CM

## 2014-10-30 DIAGNOSIS — N186 End stage renal disease: Secondary | ICD-10-CM | POA: Insufficient documentation

## 2014-10-30 DIAGNOSIS — H3530 Unspecified macular degeneration: Secondary | ICD-10-CM | POA: Diagnosis not present

## 2014-10-30 DIAGNOSIS — Z992 Dependence on renal dialysis: Secondary | ICD-10-CM

## 2014-10-30 DIAGNOSIS — K219 Gastro-esophageal reflux disease without esophagitis: Secondary | ICD-10-CM

## 2014-10-30 DIAGNOSIS — Z79899 Other long term (current) drug therapy: Secondary | ICD-10-CM | POA: Diagnosis not present

## 2014-10-30 DIAGNOSIS — H9192 Unspecified hearing loss, left ear: Secondary | ICD-10-CM

## 2014-10-30 DIAGNOSIS — I5042 Chronic combined systolic (congestive) and diastolic (congestive) heart failure: Secondary | ICD-10-CM | POA: Diagnosis not present

## 2014-10-30 DIAGNOSIS — E780 Pure hypercholesterolemia: Secondary | ICD-10-CM | POA: Diagnosis not present

## 2014-10-30 DIAGNOSIS — E1029 Type 1 diabetes mellitus with other diabetic kidney complication: Secondary | ICD-10-CM

## 2014-10-30 DIAGNOSIS — Z794 Long term (current) use of insulin: Secondary | ICD-10-CM | POA: Diagnosis not present

## 2014-10-30 DIAGNOSIS — R51 Headache: Secondary | ICD-10-CM | POA: Insufficient documentation

## 2014-10-30 DIAGNOSIS — I5043 Acute on chronic combined systolic (congestive) and diastolic (congestive) heart failure: Secondary | ICD-10-CM | POA: Diagnosis not present

## 2014-10-30 DIAGNOSIS — Z7982 Long term (current) use of aspirin: Secondary | ICD-10-CM | POA: Insufficient documentation

## 2014-10-30 DIAGNOSIS — R05 Cough: Secondary | ICD-10-CM | POA: Diagnosis not present

## 2014-10-30 DIAGNOSIS — M7989 Other specified soft tissue disorders: Secondary | ICD-10-CM | POA: Insufficient documentation

## 2014-10-30 DIAGNOSIS — IMO0002 Reserved for concepts with insufficient information to code with codable children: Secondary | ICD-10-CM

## 2014-10-30 LAB — GLUCOSE, POCT (MANUAL RESULT ENTRY): POC Glucose: 109 mg/dl — AB (ref 70–99)

## 2014-10-30 MED ORDER — HYDRALAZINE HCL 100 MG PO TABS: ORAL_TABLET | ORAL | Status: DC

## 2014-10-30 MED ORDER — AMLODIPINE BESYLATE 10 MG PO TABS: 10.0000 mg | ORAL_TABLET | Freq: Every day | ORAL | Status: DC

## 2014-10-30 MED ORDER — OMEPRAZOLE 20 MG PO CPDR: 20.0000 mg | DELAYED_RELEASE_CAPSULE | Freq: Every day | ORAL | Status: DC

## 2014-10-30 MED ORDER — ISOSORBIDE MONONITRATE ER 60 MG PO TB24: 60.0000 mg | ORAL_TABLET | Freq: Every day | ORAL | Status: DC

## 2014-10-30 NOTE — Progress Notes (Signed)
Did a walk test with the patient with 2 liters of oxygen  Patient did not fall below 100% Patient rested and a walk test with no oxygen performed Patient oxygen level did not go below 97%

## 2014-10-30 NOTE — Progress Notes (Signed)
Patient ID: Joan Young, female   DOB: [birth year], 54 y.o.   MRN: [id number]  CC: HFU  HPI: Joan Young is a 54 y.o. female here today for a follow up visit.  Patient has past medical history of end-stage renal disease on HD (T, TH, Saturday), diabetes mellitus, hypertension, and coronary artery disease. Patient was recently admitted for pneumonia on 8/8-8/11. Hospital course as follows  Hospital course 54 y.o. female, with end-stage renal disease, diabetes mellitus, hypertension, and coronary artery disease who presents to the emergency department with chest pressure. In the emergency department she was found to have multi lobar pneumonia. The patient speaks Spanish. Her daughter translates for her. The chest pressure is centrally located and started yesterday. It is worsened with any movement including speaking. It is improved with shifting positions and particularly lying on her left side. Nitroglycerin did not improve the pain. The chest pressure has been continuous in nature. It radiates to her back. She had similar chest pressure approximately 3 weeks ago when her blood pressure was too low and she suffered a presyncopal event in her medical doctor's office. She has been coughing for the past week her cough has progressively gotten worse. In the emergency department her resting O2 saturation is 85%. The patient does not normally use oxygen at home. She was hospitalized at [facility] last month. Here at [facility] with chest pain. A source was not found. Her most recent cardiac cath was at [facility] in March 2016. Admitted for tx of HCAP  Today  Patient has completed full course of Augmentin. She has been using 2 L oxygen since hospital discharge. She states that she has only been wearing the oxygen when needed. She reports that the hospital called the dialysis center and spoke with them about her dry weight. She does not remember how much is pulled off each visit. She  states that she is very unhappy with the dialysis center she is attending and has plans to meet with her Nephrologist to be switched to Peritoneal dialysis next month.  Patient reports that she has not been taking isosorbide or hydralazine because she has been out since hospital discharge. She notes that her Cardiologist placed her on Losartan daily.  Patient presents with a colonoscopy report from her GI provider at [facility] that reveals several small polyps and was told that the biopsy report showed "pre-cancerous cells" which will be removed on September 26th.    Allergies  Allergen Reactions  . Hydromorphone Itching and Swelling  . Tape Rash   Past Medical History  Diagnosis Date  . Obesity   . Macular degeneration disease   . Depression   . High cholesterol Dx 2005  . Hypertension Dx 7829  . Diabetes mellitus without complication DX 1969    type 70 since 54 years old but reports starting insulin at 54 years old  . Renal disorder   . Dialysis patient    Current Outpatient Prescriptions on File Prior to Visit  Medication Sig Dispense Refill  . albuterol (PROVENTIL HFA;VENTOLIN HFA) 108 (90 BASE) MCG/ACT inhaler Inhale 2 puffs into the lungs every 6 (six) hours as needed for shortness of breath. And cough 1 Inhaler 2  . amLODipine (NORVASC) 10 MG tablet Take 10 mg by mouth daily.    Marland Kitchen aspirin EC 81 MG tablet Take 81 mg by mouth daily.    . carvedilol (COREG) 25 MG tablet Take 1 tablet (25 mg total) by mouth 2 (two) times  daily with a meal. 60 tablet 3  . FLUoxetine (PROZAC) 40 MG capsule Take 1 capsule (40 mg total) by mouth daily. 30 capsule 0  . gabapentin (NEURONTIN) 100 MG capsule Take 1 capsule (100 mg total) by mouth 3 (three) times daily. (Patient taking differently: Take 100 mg by mouth at bedtime. ) 180 capsule 0  . insulin NPH Human (HUMULIN N,NOVOLIN N) 100 UNIT/ML injection Inject 0.08-0.24 mLs (8-24 Units total) into the skin 2 (two) times daily. 8 units in the morning and 24  units at after supper 10 mL 3  . insulin regular (NOVOLIN R,HUMULIN R) 100 units/mL injection Inject 0.1-0.16 mLs (10-16 Units total) into the skin 3 (three) times daily before meals. 16 units every morning 16 units at lunch 10 units at bedtime.  Per sliding scale 10 mL 3  . losartan (COZAAR) 50 MG tablet Take 50 mg by mouth daily.    . nitroGLYCERIN (NITROSTAT) 0.4 MG SL tablet Place 1 tablet (0.4 mg total) under the tongue every 5 (five) minutes as needed for chest pain. 30 tablet 0  . Nutritional Supplements (FEEDING SUPPLEMENT, NEPRO CARB STEADY,) LIQD Take 237 mLs by mouth 2 (two) times daily between meals.  0  . omeprazole (PRILOSEC) 20 MG capsule Take 1 capsule (20 mg total) by mouth daily. 30 capsule 2  . ondansetron (ZOFRAN ODT) 8 MG disintegrating tablet Take 1 tablet (8 mg total) by mouth every 8 (eight) hours as needed for nausea or vomiting. 30 tablet 0  . pravastatin (PRAVACHOL) 40 MG tablet Take 40 mg by mouth daily.    . ranitidine (ZANTAC) 150 MG tablet Take 1 tablet (150 mg total) by mouth at bedtime.    . saccharomyces boulardii (FLORASTOR) 250 MG capsule Take 1 capsule (250 mg total) by mouth 2 (two) times daily. 60 capsule 1  . timolol (BETIMOL) 0.5 % ophthalmic solution Place 1 drop into the right eye daily.    . calcium carbonate (OS-CAL - DOSED IN MG OF ELEMENTAL CALCIUM) 1250 (500 CA) MG tablet Take 1 tablet (500 mg of elemental calcium total) by mouth 3 (three) times daily with meals. 90 tablet 1  . guaiFENesin (MUCINEX) 600 MG 12 hr tablet Take 1 tablet (600 mg total) by mouth 2 (two) times daily. 30 tablet 0  . hydrALAZINE (APRESOLINE) 100 MG tablet TAKE 1 TABLET BY MOUTH 3 TIMES DAILY. 90 tablet 0  . isosorbide mononitrate (IMDUR) 60 MG 24 hr tablet Take 1 tablet (60 mg total) by mouth daily. 30 tablet 0   No current facility-administered medications on file prior to visit.   Family History  Problem Relation Age of Onset  . Hypertension Father   . Diabetes Father    . Kidney failure Father 60  . Kidney failure Sister 43  . Kidney failure Brother 30  . Kidney failure Sister 43   Social History   Social History  . Marital Status: Married    Spouse Name: N/A  . Number of Children: N/A  . Years of Education: 6th grade   Occupational History  . homemaker    Social History Main Topics  . Smoking status: Never Smoker   . Smokeless tobacco: Never Used  . Alcohol Use: No  . Drug Use: No  . Sexual Activity: Not on file   Other Topics Concern  . Not on file   Social History Narrative    Review of Systems  Respiratory: Positive for cough (improving) and shortness of breath. Negative for sputum  production and wheezing.   Cardiovascular: Positive for leg swelling. Negative for chest pain and palpitations.  Musculoskeletal: Positive for back pain.  Neurological: Positive for headaches.  All other systems reviewed and are negative.   Objective:   Filed Vitals:   10/30/14 0933  BP: 155/75  Pulse: 72  Temp: 98 F (36.7 C)  Resp: 16   Physical Exam  Constitutional: She is oriented to person, place, and time.  Cardiovascular: Normal rate, regular rhythm and normal heart sounds.   Pulmonary/Chest: Effort normal and breath sounds normal. She has no wheezes. She has no rales.  Musculoskeletal: She exhibits no edema or tenderness.  Neurological: She is alert and oriented to person, place, and time.  Skin: Skin is warm and dry.  Psychiatric: She has a normal mood and affect.     Lab Results  Component Value Date   WBC 3.8* 10/10/2014   HGB 8.3* 10/10/2014   HCT 24.8* 10/10/2014   MCV 95.0 10/10/2014   PLT 126* 10/10/2014   Lab Results  Component Value Date   CREATININE 6.78* 10/10/2014   BUN 33* 10/10/2014   NA 134* 10/10/2014   K 4.1 10/10/2014   CL 94* 10/10/2014   CO2 30 10/10/2014    Lab Results  Component Value Date   HGBA1C 8.9* 10/07/2014   Lipid Panel  No results found for: CHOL, TRIG, HDL, CHOLHDL, VLDL,  LDLCALC     Assessment and plan:   Joan Young was seen today for follow-up.  Diagnoses and all orders for this visit:  Essential hypertension -     hydrALAZINE (APRESOLINE) 100 MG tablet; TAKE 1 TABLET BY MOUTH 3 TIMES DAILY. -     isosorbide mononitrate (IMDUR) 60 MG 24 hr tablet; Take 1 tablet (60 mg total) by mouth daily. Patient will begin back on isosorbide and hydralazine. I have advised patient to use one pharmacy for all medications to prevent error. Some meds have [facility] and others are from [facility].   ESRD on hemodialysis I have again stressed the complications of peritoneal dialysis. Patient is adamant about switching. Patient will take all medications to Nephrologist to confirm use. Due to severity of patients conditions I have switched her to Dr. Hyman Hopes for further care.   Acute on chronic combined systolic and diastolic congestive heart failure Lungs clear and weight normal. Patient is stable. She is on correct medications for management. Continue f/u with Cardiology. 3 minute walk with Oxygen no desaturation. 3 minute walk without oxygen she only dropped to 97%. Will have patient wear O2 as needed and f/u with Dr. 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 She will continue regimen. Explained importance of strict glycemic control to prevent further complications.   Hearing loss, left -     Ambulatory referral to Audiology Patient failed hearing test in left ear at all frequencies. I will refer patient to audiology to determine if hearing aid is needed.   Gastroesophageal reflux disease, esophagitis presence not specified -     Refill omeprazole (PRILOSEC) 20 MG capsule; Take 1 capsule (20 mg total) by mouth daily. Patient was on omeprazole and Zantac given to her by different providers. I have d/c'ed Zantac and she will continue on Omeprazole only.   Total time spent with patient was 30  minutes. > 50% spent counseling, reviewing records, and coordination care with patient.  Due to language barrier, an interpreter  was present during the history-taking and subsequent discussion (and for part of the physical exam) with this patient.  Return for with [facility] September 12th for further management. He will determine if Oxygen can be discontinued.        Ambrose Finland, NP-C [facility] [phone] 10/30/2014, 9:46 AM

## 2014-10-30 NOTE — Progress Notes (Signed)
Patient here for follow up from the hospital Patient was admitted with pneumonia  Patient is currently on 2L oxygen Patient does have her medications with her but not sure if she if she is supposed Be taking hydralazine or her imdur that are on her list

## 2014-11-01 ENCOUNTER — Ambulatory Visit: Payer: BLUE CROSS/BLUE SHIELD

## 2014-11-08 ENCOUNTER — Ambulatory Visit: Payer: BLUE CROSS/BLUE SHIELD | Attending: Internal Medicine | Admitting: Physical Therapy

## 2014-11-08 DIAGNOSIS — R2681 Unsteadiness on feet: Secondary | ICD-10-CM | POA: Diagnosis present

## 2014-11-08 DIAGNOSIS — R269 Unspecified abnormalities of gait and mobility: Secondary | ICD-10-CM | POA: Diagnosis not present

## 2014-11-08 DIAGNOSIS — R201 Hypoesthesia of skin: Secondary | ICD-10-CM | POA: Diagnosis present

## 2014-11-08 DIAGNOSIS — M6281 Muscle weakness (generalized): Secondary | ICD-10-CM | POA: Insufficient documentation

## 2014-11-09 ENCOUNTER — Encounter: Payer: Self-pay | Admitting: Physical Therapy

## 2014-11-09 NOTE — Therapy (Signed)
[facility] [address] [place], [place], [address] Phone: [phone]   Fax:  [phone]  Physical Therapy Evaluation  Patient Details  Name: Joan Young MRN: [id number] Date of Birth: 1961-02-26 Referring Provider:  Vassie Loll, MD  Encounter Date: 11/08/2014      PT End of Session - 11/09/14 1702    Visit Number 1   Number of Visits 17  eval + 16 visits   Date for PT Re-Evaluation 01/07/15   Authorization Type BCBS   PT Start Time [phone]   PT Stop Time 1016   PT Time Calculation (min) 45 min   Activity Tolerance Patient tolerated treatment well   Behavior During Therapy [facility] for tasks assessed/performed      Past Medical History  Diagnosis Date  . Obesity   . Macular degeneration disease   . Depression   . High cholesterol Dx 2005  . Hypertension Dx 4010  . Diabetes mellitus without complication DX 1969    type 89 since 54 years old but reports starting insulin at 54 years old  . Renal disorder   . Dialysis patient     Past Surgical History  Procedure Laterality Date  . Av fistula placement Left 12/19/2013    Procedure: Creation of Left Arm BRACHIOCEPHALIC ARTERIOVENOUS FISTULA ;  Surgeon: Fransisco Hertz, MD;  Location: [facility] OR;  Service: Vascular;  Laterality: Left;  . Back surgery      There were no vitals filed for this visit.  Visit Diagnosis:  Abnormality of gait - Plan: PT plan of care cert/re-cert  Unsteadiness - Plan: PT plan of care cert/re-cert  Muscle weakness (generalized) - Plan: PT plan of care cert/re-cert  Impaired sensation - Plan: PT plan of care cert/re-cert          [facility] PT Assessment - 11/09/14 0001    Transfers   Transfers --   Sit to Stand --   Stand to Sit --   Ambulation/Gait   Ambulation/Gait --                           PT Education - 11/09/14 1656    Education provided Yes   Education Details Pt eval findings, goals, and POC.   Person(s) Educated Patient;Child(ren)   Methods Explanation   Comprehension Verbalized understanding          PT Short Term Goals - 11/09/14 1717    PT SHORT TERM GOAL #1   Title Pt will perform initial HEP with mod I using paper handout to indicate safe compliance with HEP, maximize functional gains made in PT. Target date: 12/06/14.   PT SHORT TERM GOAL #2   Title Perform Berg Balance Scale to assess/address functional standing balance. Target date: 12/06/14.   PT SHORT TERM GOAL #3   Title Contact orthotist to determine options to address Charcot deformity to prevent longterm disability. Target date: 12/06/14.   PT SHORT TERM GOAL #4   Title Pt will negotiate 4 stairs with 2 rails with mod I to indicate increased independence using primary entrance of home. Target date: 12/06/14.   Baseline Pt reports that at times, daughter must assist her when going up/down stairs.   PT SHORT TERM GOAL #5   Title Pt will increase self-selected gait speed from 1.61 ft/sec to >1.8 ft/sec to indicate decreased risk of recurrent falls. Target date: 12/06/14.           PT  Long Term Goals - 11/09/14 1721    PT LONG TERM GOAL #1   Title Pt will verbalize understanding of fall prevention strategies to decrease risk of falling in home environment. Target date: 01/03/15.   PT LONG TERM GOAL #2   Title Pt will increase Berg Balance Scale score by 6 points from baseline to indicate significant improvement in functional standing balance.Target date: 01/03/15.   PT LONG TERM GOAL #3   Title Pt will increase self-selected gait speed from 1.61 ft/sec to 2.21 ft/sec to indicate significant improvement in efficiency of ambulation. Target date: 01/03/15.   PT LONG TERM GOAL #4   Title Pt will perform gait x500' over level and unlevel, paved surfaces with mod I using LRAD to indicate increased safety with community mobility. Target date: 01/03/15.   PT LONG TERM GOAL #5   Title Pt will negotiate standard ramp and curb  step with mod I using LRAD to indicate ability to safely traverse community obstacles. Target date: 01/03/15.   Additional Long Term Goals   Additional Long Term Goals Yes   PT LONG TERM GOAL #6   Title Pt will increase Primary FOTO score from 64 to 74 to indicate improvement in pt-perceived functional status. Target date: 01/03/15.               Plan - 11/09/14 1704    Clinical Impression Statement Pt is a 54 y/o F referred to outpatient PT to address balance impairments associated with diabetic neuropathy. PT evaluation reveals the following impairments: self-selected gait speed indicative of recurrent fall risk; impaired standing balance; Charcot's osteoarthropathy of R foot, gait abnormality; generalized weakness; decreased strength in R ankle; impaired sensation in B LE's due to longstanding diabetic neuropathy; impaired vision due to diabetic retinopathy; pt fear/avoidance of RLE weightbearing due to decreased understanding of Charcot's deformity; decreased stabilty/independence with functiional mobility. Pt will benefit from skilled outpatient PT 2x/week for up to 8 weeks to address said impairments.   Pt will benefit from skilled therapeutic intervention in order to improve on the following deficits Abnormal gait;Decreased balance;Decreased mobility;Decreased activity tolerance;Decreased endurance;Decreased strength;Increased edema;Impaired sensation;Impaired perceived functional ability;Impaired vision/preception   Rehab Potential Good   Clinical Impairments Affecting Rehab Potential relies on daughters for transportation; multiple medical appts (including hemodialysis 2x/week)   PT Frequency 2x / week   PT Duration 8 weeks   PT Treatment/Interventions ADLs/Self Care Home Management;Functional mobility training;Gait training;Stair training;DME Instruction;Orthotic Fit/Training;Patient/family education;Neuromuscular re-education;Balance training;Therapeutic exercise;Therapeutic  activities;Vestibular   PT Next Visit Plan Contact orthotist about custom molded shoes; perform Berg, initiate HEP; fall prevention strategies   Recommended Other Services Per medical chart, podiatrist prescribed custom molded shoes in 05/2014 but pt did not fill Rx due to expense of shoes. Contact orthotist to see if less expensive option available.   Consulted and Agree with Plan of Care Patient         Problem List Patient Active Problem List   Diagnosis Date Noted  . Chronic combined systolic and diastolic CHF, NYHA class 2   . HCAP (healthcare-associated pneumonia) 10/07/2014  . Hypertensive urgency 10/07/2014  . Chest pain 09/09/2014  . Diabetic neuropathy 05/01/2014  . Hypoxia 03/29/2014  . Congestive heart disease   . Secondary hyperparathyroidism of renal origin 12/15/2013  . Anemia in chronic kidney disease 12/14/2013  . Pulmonary edema 12/12/2013  . Acute on chronic combined systolic and diastolic congestive heart failure 12/12/2013  . ESRD on hemodialysis 12/12/2013  . Type 1 diabetes mellitus with renal manifestations,  uncontrolled 03/10/2006  . Hyperlipidemia 03/10/2006  . Essential hypertension 03/10/2006    Jorje Guild, PT, DPT [facility] [address] [place], [place], [address] Phone: [phone]   Fax:  [phone] 11/09/2014, 5:45 PM

## 2014-11-11 ENCOUNTER — Encounter: Payer: Self-pay | Admitting: Internal Medicine

## 2014-11-11 ENCOUNTER — Ambulatory Visit: Payer: BLUE CROSS/BLUE SHIELD | Attending: Internal Medicine | Admitting: Internal Medicine

## 2014-11-11 VITALS — BP 186/74 | HR 80 | Temp 98.4°F | Resp 18 | Ht 61.0 in | Wt 177.0 lb

## 2014-11-11 DIAGNOSIS — N186 End stage renal disease: Secondary | ICD-10-CM

## 2014-11-11 DIAGNOSIS — I10 Essential (primary) hypertension: Secondary | ICD-10-CM | POA: Diagnosis not present

## 2014-11-11 DIAGNOSIS — Z992 Dependence on renal dialysis: Secondary | ICD-10-CM

## 2014-11-11 DIAGNOSIS — H9192 Unspecified hearing loss, left ear: Secondary | ICD-10-CM | POA: Diagnosis not present

## 2014-11-11 DIAGNOSIS — E1140 Type 2 diabetes mellitus with diabetic neuropathy, unspecified: Secondary | ICD-10-CM

## 2014-11-11 DIAGNOSIS — E1029 Type 1 diabetes mellitus with other diabetic kidney complication: Secondary | ICD-10-CM | POA: Diagnosis not present

## 2014-11-11 DIAGNOSIS — E1065 Type 1 diabetes mellitus with hyperglycemia: Principal | ICD-10-CM

## 2014-11-11 DIAGNOSIS — IMO0002 Reserved for concepts with insufficient information to code with codable children: Secondary | ICD-10-CM

## 2014-11-11 DIAGNOSIS — H9190 Unspecified hearing loss, unspecified ear: Secondary | ICD-10-CM | POA: Insufficient documentation

## 2014-11-11 LAB — MICROALBUMIN / CREATININE URINE RATIO: CREATININE, URINE: 135.5 mg/dL

## 2014-11-11 LAB — GLUCOSE, POCT (MANUAL RESULT ENTRY): POC Glucose: 245 mg/dl — AB (ref 70–99)

## 2014-11-11 MED ORDER — INSULIN GLARGINE 100 UNIT/ML SOLOSTAR PEN: 10.0000 [IU] | PEN_INJECTOR | Freq: Every morning | SUBCUTANEOUS | Status: DC

## 2014-11-11 MED ORDER — ACETAMINOPHEN-CODEINE #3 300-30 MG PO TABS: 1.0000 | ORAL_TABLET | ORAL | Status: DC | PRN

## 2014-11-11 NOTE — Progress Notes (Signed)
BS 245 Patient here because Vikki Ports wanted patient to see Dr. Hyman Hopes. Patient has fistula in right arm. Patient has dialysis Tues, Thursday and Saturday. Patient reports feeling fine.  Blood glucose runs high sometimes and low sometimes. Current blood glucose 245.  Patient would like referral for hearing, can't hear out of left ear, has been going on for 3 months.  Patient denies pain at this time.

## 2014-11-11 NOTE — Patient Instructions (Signed)
Recuento bsico de carbohidratos para la diabetes mellitus (Basic Carbohydrate Counting for Diabetes Mellitus) El recuento de carbohidratos es un mtodo destinado a calcular la cantidad de carbohidratos en la dieta. El consumo de carbohidratos aumenta naturalmente el nivel de azcar (glucosa) en la sangre, por lo que es importante que sepa la cantidad que debe incluir en cada comida. El recuento de carbohidratos ayuda a mantener el nivel de glucosa en la sangre dentro de los lmites normales. La cantidad permitida de carbohidratos es diferente para cada persona. Un nutricionista puede ayudarlo a calcular la cantidad adecuada para usted. Una vez que sepa la cantidad de carbohidratos que puede consumir, podr calcular los carbohidratos de los alimentos que desea comer. Los siguientes alimentos incluyen carbohidratos:  Granos, como panes y cereales.  Frijoles secos y productos con soja.  Vegetales almidonados, como papas, guisantes y maz.  Frutas y jugos de frutas.  Leche y yogur.  Dulces y bocadillos, como pastel, galletas, caramelos, papas fritas de bolsa, refrescos y bebidas frutales con azcar. RECUENTO DE CARBOHIDRATOS Hay dos maneras de calcular los carbohidratos de los alimentos. Puede usar cualquiera de los dos mtodos o una combinacin de ambos. Leer la etiqueta de informacin nutricional de los alimentos envasados La informacin nutricional es una etiqueta incluida en casi todas las bebidas y los alimentos envasados de los [place]. Indica el tamao de la porcin de ese alimento o bebida e informacin sobre los nutrientes de cada porcin, incluso los gramos (g) de carbohidratos por porcin.  Decida la cantidad de porciones que comer o tomar de este alimento o bebida. Multiplique la cantidad de porciones por el nmero de gramos de carbohidratos indicados en la etiqueta para esa porcin. El total ser la cantidad de carbohidratos que consumir al comer ese alimento o tomar esa  bebida. Conocer las porciones estndar de los alimentos Cuando coma alimentos no envasados o que no incluyan la informacin nutricional en la etiqueta, deber medir las porciones para poder calcular la cantidad de carbohidratos. Una porcin de la mayora de los alimentos ricos en carbohidratos contiene alrededor de 15g de carbohidratos. La siguiente lista incluye los tamaos de porcin de los alimentos ricos en carbohidratos que contienen alrededor de 15g de carbohidratos por porcin:   1rebanada de pan (1oz) o 1tortilla de seis pulgadas.  panecillo de hamburguesa o bollito tipo ingls.  4a 6galletas.   de taza de cereal sin azcar y seco.   taza de cereal caliente.   de taza de arroz o pastas.  taza de pur de papas o de una papa grande al horno.  1taza de frutas frescas o una fruta pequea.  taza de frutas o jugo de frutas enlatados o congelados.  1 taza de leche.   de taza de yogur descremado sin ningn agregado o de yogur endulzado con edulcorante artificial.  taza de vegetales almidonados, como guisantes, maz o papas, o de frijoles secos cocidos. Decida la cantidad de porciones estndar que comer. Multiplique la cantidad de porciones por 15 (los gramos de carbohidratos en esa porcin). Por ejemplo, si come 2tazas de fresas, habr comido 2porciones y 30g de carbohidratos (2porciones x 15g = 30g). Para las comidas como sopas y guisos, en las que se mezcla ms de un alimento, deber contar los carbohidratos de cada alimento incluido. EJEMPLO DE RECUENTO DE CARBOHIDRATOS Ejemplo de cena  3 onzas de pechugas de pollo.   de taza de arroz integral.   taza de maz.  1 taza de leche.  1 taza de fresas   con crema batida sin azcar. Clculo de carbohidratos Paso 1: Identifique los alimentos que contienen carbohidratos:   Arroz.  Maz.  Leche.  Fresas. Paso 2: Calcule el nmero de porciones que consumir de cada uno:   2 porciones de  arroz.  1 porcin de maz.  1 porcin de leche.  1 porcin de fresas. Paso 3: Multiplique cada una de esas porciones por 15g:   2 porciones de arroz x 15 g = 30 g.  1 porcin de maz x 15 g = 15 g.  1 porcin de leche x 15 g = 15 g.  1 porcin de fresas x 15 g = 15 g. Paso 4: Sume todas las cantidades para conocer el total de gramos de carbohidratos consumidos: 30 g + 15 g + 15 g + 15 g = 75 g. Document Released: 05/10/2011 Document Revised: 07/02/2013 [employer] Patient Information 2015 [employer]. This information is not intended to replace advice given to you by your health care provider. Make sure you discuss any questions you have with your health care provider. Diabetes y actividad fsica (Diabetes and Exercise) Hacer actividad fsica con regularidad es muy importante. No se trata solo de perder peso. Tiene muchos otros beneficios, como por ejemplo:  Mejorar el estado fsico, la flexibilidad y la resistencia.  Aumenta la densidad sea.  Ayuda a controlar el peso.  Disminuye la grasa corporal.  Aumenta la fuerza muscular.  Reduce el estrs y las tensiones.  Mejora el estado de salud general. Las personas diabticas que realizan actividad fsica tienen beneficios adicionales debido al ejercicio:  Reduce el apetito.  El organismo mejora el uso del azcar (glucosa) de la sangre.  Ayuda a disminuir o controlar la glucosa en la sangre.  Disminuye la presin arterial.  Ayuda a disminuir los lpidos en la sangre (colesterol y triglicridos).  El organismo mejora el uso de la insulina porque:  Aumenta la sensibilidad del organismo a la insulina.  Reduce las necesidades de insulina del organismo.  Disminuye el riesgo de enfermedad cardaca por la actividad fsica ya que  disminuye el colesterol y tambin los triglicridos.  Aumenta los niveles de colesterol bueno (como las lipoprotenas de alta densidad [HDL]) en el organismo.  Disminuye los niveles de  glucosa en la sangre. SU PLAN DE ACTIVIDAD  Elija una actividad que disfrute y establezca objetivos realistas. Su mdico o educador en diabetes podrn ayudarlo a encontrar una actividad que lo beneficie. Haga ejercicio regularmente como se lo haya indicado el mdico. Esto incluye:  Hacer entrenamiento de resistencia dos veces a la semana, como flexiones, sentadillas, levantar peso o usar bandas de resistencia.  Practicar 150minutos de ejercicios cardiovasculares cada semana, como caminar, correr o hacer algn deporte.  Mantenerse activo y no permanecer inactivo durante ms de 90minutos seguidos. Los perodos cortos de actividad tambin son beneficiosos. Tres sesiones de 10minutos a lo largo del da son tan beneficiosas como una sola sesin de 30minutos. Estas son algunas ideas para los ejercicios:  Lleve a pasear el perro.  Utilice las escaleras en lugar del ascensor.  Baile su cancin favorita.  Haga los ejercicios de un video de ejercicios.  Haga sus ejercicios favoritos con un amigo. RECOMENDACIONES PARA REALIZAR EJERCICIOS CUANDO SE TIENE DIABETES TIPO 1 O TIPO 2   Controle la glucosa en la sangre antes de comenzar. Si el nivel de glucosa en la sangre es de ms de 240 mg/dl, controle las cetonas en la orina. No haga actividad fsica si hay cetonas.  Evite   inyectarse insulina en las zonas del cuerpo que ejercitar. Por ejemplo, evite inyectarse insulina en:  Los brazos, si juega al tenis.  Las piernas, si corre.  Lleve un registro de:  Los alimentos que consume antes y despus de hacer el ejercicio.  Los momentos esperables de picos de accin de la insulina.  Los niveles de glucosa en la sangre antes y despus de hacer ejercicios.  El tipo y cantidad de actividad fsica que realiza.  Revise los registros con su mdico. El mdico lo ayudar a desarrollar pautas para ajustar la cantidad de alimento y las cantidades de insulina antes y despus de hacer ejercicios.  Si  toma insulina o agentes hipoglucemiantes por va oral, observe si hay signos y sntomas de hipoglucemia. Entre los que se incluyen:  Mareos.  Temblores.  Sudoracin.  Escalofros.  Confusin.  Beba gran cantidad de agua mientras hace ejercicios para evitar la deshidratacin o los golpes de calor. Durante la actividad fsica se pierde agua corporal que se debe reponer.  Comente con su mdico antes de comenzar un programa de actividad fsica para verificar que sea seguro para usted. Recuerde, cualquier actividad es mejor que ninguna. Document Released: 03/07/2007 Document Revised: 07/02/2013 Exit[employer] Patient Information 2015 [employer]. This information is not intended to replace advice given to you by your health care provider. Make sure you discuss any questions you have with your health care provider.  

## 2014-11-11 NOTE — Progress Notes (Signed)
Patient ID: Joan Young, female   DOB: 1960/10/02, 54 y.o.   MRN: [id number]   Joan Young, is a 54 y.o. female  [id number]  [id number]  DOB - 1960-08-05  Chief Complaint  Patient presents with  . Follow-up        Subjective:   Joan Young is a 54 y.o. 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 Patient has fistula in right arm, on dialysis Tues, Thursday and Saturday. Patient reports feeling fine today, no new complaint. Blood glucose runs high sometimes and low sometimes but no hypoglycemic episode, no syncope. Current blood glucose 245. Patient would like referral to ENT surgeon for for hearing test, can't hear out of left ear, has been going on for 3 months. Patient denies pain at this time. Patient has No headache, No chest pain, No abdominal pain - No Nausea, No new weakness tingling or numbness, No Cough - SOB.  Problem  Hearing Loss    ALLERGIES: Allergies  Allergen Reactions  . Hydromorphone Itching and Swelling  . Tape Rash    PAST MEDICAL HISTORY: Past Medical History  Diagnosis Date  . Obesity   . Macular degeneration disease   . Depression   . High cholesterol Dx 2005  . Hypertension Dx 4696  . Diabetes mellitus without complication DX 1969    type 33 since 54 years old but reports starting insulin at 54 years old  . Renal disorder   . Dialysis patient     MEDICATIONS AT HOME: Prior to Admission medications   Medication Sig Start Date End Date Taking? Authorizing Provider  albuterol (PROVENTIL HFA;VENTOLIN HFA) 108 (90 BASE) MCG/ACT inhaler Inhale 2 puffs into the lungs every 6 (six) hours as needed for shortness of breath. And cough 07/08/14  Yes Ambrose Finland, NP  amLODipine (NORVASC) 10 MG tablet Take 1 tablet (10  mg total) by mouth daily. 10/30/14  Yes Ambrose Finland, NP  aspirin EC 81 MG tablet Take 81 mg by mouth daily.   Yes Historical Provider, MD  calcium carbonate (OS-CAL - DOSED IN MG OF ELEMENTAL CALCIUM) 1250 (500 CA) MG tablet Take 1 tablet (500 mg of elemental calcium total) by mouth 3 (three) times daily with meals. 10/10/14  Yes Vassie Loll, MD  carvedilol (COREG) 25 MG tablet Take 1 tablet (25 mg total) by mouth 2 (two) times daily with a meal. 05/01/14  Yes Ambrose Finland, NP  FLUoxetine (PROZAC) 40 MG capsule Take 1 capsule (40 mg total) by mouth daily. 10/10/14  Yes Vassie Loll, MD  guaiFENesin (MUCINEX) 600 MG 12 hr tablet Take 1 tablet (600 mg total) by mouth 2 (two) times daily. 10/10/14  Yes Vassie Loll, MD  hydrALAZINE (APRESOLINE) 100 MG tablet TAKE 1 TABLET BY MOUTH 3 TIMES DAILY. 10/30/14  Yes Ambrose Finland, NP  insulin NPH Human (HUMULIN N,NOVOLIN N) 100 UNIT/ML injection Inject 0.08-0.24 mLs (8-24 Units total) into the skin 2 (two) times daily. 8 units in the morning and 24 units at after supper 05/01/14  Yes Ambrose Finland, NP  insulin regular (NOVOLIN R,HUMULIN R) 100 units/mL injection Inject 0.1-0.16 mLs (10-16 Units total) into the skin 3 (three) times daily before meals. 16 units every morning 16 units at lunch 10 units at bedtime.  Per sliding scale 05/01/14  Yes Raenette Rover  Luna Glasgow, NP  isosorbide mononitrate (IMDUR) 60 MG 24 hr tablet Take 1 tablet (60 mg total) by mouth daily. 10/30/14  Yes Ambrose Finland, NP  losartan (COZAAR) 50 MG tablet Take 50 mg by mouth daily.   Yes Historical Provider, MD  nitroGLYCERIN (NITROSTAT) 0.4 MG SL tablet Place 1 tablet (0.4 mg total) under the tongue every 5 (five) minutes as needed for chest pain. 09/10/14  Yes Rhetta Mura, MD  omeprazole (PRILOSEC) 20 MG capsule Take 1 capsule (20 mg total) by mouth daily. 10/30/14  Yes Ambrose Finland, NP  ondansetron (ZOFRAN ODT) 8 MG disintegrating tablet Take 1 tablet (8 mg total) by mouth every 8 (eight)  hours as needed for nausea or vomiting. 10/10/14  Yes Vassie Loll, MD  pravastatin (PRAVACHOL) 40 MG tablet Take 40 mg by mouth daily.   Yes Historical Provider, MD  saccharomyces boulardii (FLORASTOR) 250 MG capsule Take 1 capsule (250 mg total) by mouth 2 (two) times daily. 10/10/14  Yes Vassie Loll, MD  timolol (BETIMOL) 0.5 % ophthalmic solution Place 1 drop into the right eye daily.   Yes Historical Provider, MD  acetaminophen-codeine (TYLENOL #3) 300-30 MG per tablet Take 1 tablet by mouth every 4 (four) hours as needed. 11/11/14   Quentin Angst, MD  gabapentin (NEURONTIN) 100 MG capsule Take 1 capsule (100 mg total) by mouth 3 (three) times daily. Patient taking differently: Take 100 mg by mouth at bedtime.  08/27/14   Ambrose Finland, NP  Insulin Glargine (LANTUS SOLOSTAR) 100 UNIT/ML Solostar Pen Inject 10 Units into the skin every morning. 11/11/14   Quentin Angst, MD  Nutritional Supplements (FEEDING SUPPLEMENT, NEPRO CARB STEADY,) LIQD Take 237 mLs by mouth 2 (two) times daily between meals. Patient not taking: Reported on 11/11/2014 10/10/14   Vassie Loll, MD     Objective:   Filed Vitals:   11/11/14 1610 11/11/14 0938  BP: 178/80 192/75  Pulse: 80   Temp: 98.4 F (36.9 C)   TempSrc: Oral   Resp: 18   Height: 5\' 1"  (1.549 m)   Weight: 177 lb (80.287 kg)   SpO2: 97%     Exam General appearance : Awake, alert, not in any distress. Speech Clear. Not toxic looking HEENT: Atraumatic and Normocephalic, pupils equally reactive to light and accomodation Neck: supple, no JVD. No cervical lymphadenopathy.  Chest:Good air entry bilaterally, no added sounds  CVS: S1 S2 regular, no murmurs.  Abdomen: Bowel sounds present, Non tender and not distended with no gaurding, rigidity or rebound. Extremities: B/L Lower Ext shows no edema, both legs are warm to touch Neurology: Awake alert, and oriented X 3, CN II-XII intact, Non focal Skin: No Rash  Data Review Lab Results    Component Value Date   HGBA1C 8.9* 10/07/2014   HGBA1C 9.4* 09/09/2014   HGBA1C 9.40 07/08/2014     Assessment & Plan   1. Type 1 diabetes mellitus with renal manifestations, uncontrolled  - Glucose (CBG) - Microalbumin/Creatinine Ratio, Urine  - Insulin Glargine (LANTUS SOLOSTAR) 100 UNIT/ML Solostar Pen; Inject 10 Units into the skin every morning.  Dispense: 5 pen; Refill: 3  Aim for 30 minutes of exercise most days. Rethink what you drink. Water is great! Aim for 2-3 Carb Choices per meal (30-45 grams) +/- 1 either way  Aim for 0-15 Carbs per snack if hungry  Include protein in moderation with your meals and snacks  Consider reading food labels for Total Carbohydrate and Fat Grams of  foods  Consider checking BG at alternate times per day  Continue taking medication as directed Be mindful about how much sugar you are adding to beverages and other foods. Fruit Punch - find one with no sugar  Measure and decrease portions of carbohydrate foods  Make your plate and don't go back for seconds  2. ESRD on hemodialysis  - Continue Dialysis - Follow up with Nephrologist  3. Essential hypertension  We have discussed target BP range and blood pressure goal. I have advised patient to check BP regularly and to call us back or report to clinic if the numbers are consistently higher than 140/90. We discussed the importance of compliance with medical therapy and DASH diet recommended, consequences of uncontrolled hypertension discussed.   - continue current BP medications  4. Hearing loss, left  - Ambulatory referral to ENT  5. Diabetic neuropathy, painful  - acetaminophen-codeine (TYLENOL #3) 300-30 MG per tablet; Take 1 tablet by mouth every 4 (four) hours as needed.  Dispense: 60 tablet; Refill: 0  Patient have been counseled extensively about nutrition and exercise  Return in about 2 weeks (around 11/25/2014) for CBG, Lab/Nurse Visit, BP Check, Nurse Visit.  Interpreter  was used to communicate directly with patient for the entire encounter including providing detailed patient instructions.   The patient was given clear instructions to go to ER or return to medical center if symptoms don't improve, worsen or new problems develop. The patient verbalized understanding. The patient was told to call to get lab results if they haven't heard anything in the next week.   This note has been created with Education officer, environmental. Any transcriptional errors are unintentional.    Jeanann Lewandowsky, MD, MHA, Maxwell Caul, CPE [facility] and [facility] [place], [place] [phone]   11/11/2014, 10:18 AM

## 2014-11-14 ENCOUNTER — Other Ambulatory Visit: Payer: Self-pay | Admitting: Internal Medicine

## 2014-11-27 ENCOUNTER — Ambulatory Visit: Payer: BLUE CROSS/BLUE SHIELD | Attending: Internal Medicine | Admitting: Pharmacist

## 2014-11-27 DIAGNOSIS — E1065 Type 1 diabetes mellitus with hyperglycemia: Secondary | ICD-10-CM

## 2014-11-27 DIAGNOSIS — E1029 Type 1 diabetes mellitus with other diabetic kidney complication: Secondary | ICD-10-CM | POA: Insufficient documentation

## 2014-11-27 DIAGNOSIS — IMO0002 Reserved for concepts with insufficient information to code with codable children: Secondary | ICD-10-CM

## 2014-11-27 MED ORDER — INSULIN REGULAR HUMAN 100 UNIT/ML IJ SOLN: 3.0000 [IU] | Freq: Three times a day (TID) | INTRAMUSCULAR | Status: DC

## 2014-11-27 NOTE — Patient Instructions (Addendum)
Thank you for coming today!  Continue taking your lantus at 10 units each morning.  Take your insulin R at 3 units with each meal. DO NOT take your insulin R if you do not eat.  Continue checking your blood sugar in the morning when you wake up and during the day as you have been doing and bring to your next appointment.  If you need to correct in the evening before bed, make a note of how many units of insulin R was used. Use one or two units less than what you normally would use.   Follow up in the pharmacy clinic in 1 week

## 2014-11-27 NOTE — Progress Notes (Signed)
S:    Patient arrives in a pleasant mood with her daughter who translates for her. Presents for diabetes management. Long-standing T1DM.    Patient reports adherence with medications however, is confused about regimen. Current diabetes medications include lantus 10 units daily, and humulin R per sliding scale once in the evening.  Patient reports hypoglycemic events.  Patient reported dietary habits: Does not have a strong appetite and eats rarely due to dialysis. When she does eat, she reports eating a normal size meal.  Per patient's daughter, she does not maintain much of an appetite and generally eats very little. However, when she does eat, it is a normal size meal.  Patient reported exercise habits: Does not exercise regularly.   Patient reports nocturia once or twice a night. Patient reports neuropathy. Patient denies visual changes.   O:  Lab Results  Component Value Date   HGBA1C 8.9* 10/07/2014    Home fasting CBG: 140-270 2 hour post-prandial/random CBG: 200-600.  Multiple hypoglycemic events 40-60s in the evening after her regular insulin use  A/P: Long-standing T1DM currently uncontrolled based on A1c of 8.9 and multiple hypoglycemic events. Patient reports hypoglycemic events and is able to verbalize appropriate hypoglycemia management plan.  Patient reports adherence with medication but, shows some lack of understanding in regard to her regimen. Control is suboptimal due to poor understanding of regimen. Patient brought BG logs to clinic today with values varying widely. Is using 10 units of lantus in the morning and then correcting once in the evening with regular insulin per a sliding scale. States she will use anywhere from 3 to 10 units to correct but, did not bring the sliding scale she uses to visit. Often has lows of 45-55 after correcting.  Low BG values are concerning.   Continue Lantus 10 units daily and use 3 units of regular insulin when she eats. Advised pt  to avoid using her regular insulin if she does not eat for right now. Patient requested to continue using her sliding scale at night due to concerns over her BG going to high. Instructed patient to reduce what she would normally use for correction by 1-2 units, especially in higher doses, to avoid hypoglycemia. Patient will continue to monitor blood glucose and follow up in 1 week.  Next A1C anticipated November 2016.    Written patient instructions provided. Total time in face to face counseling 50 minutes. Follow up in Pharmacist Clinic Visit in one week. Patient seen with Sherle Poe, PharmD resident.

## 2014-12-03 ENCOUNTER — Emergency Department (HOSPITAL_COMMUNITY)
Admission: EM | Admit: 2014-12-03 | Discharge: 2014-12-03 | Disposition: A | Discharge status: Home / Self Care | Payer: BLUE CROSS/BLUE SHIELD | Attending: Emergency Medicine | Admitting: Emergency Medicine

## 2014-12-03 ENCOUNTER — Encounter (HOSPITAL_COMMUNITY): Payer: Self-pay | Admitting: Emergency Medicine

## 2014-12-03 DIAGNOSIS — Z8669 Personal history of other diseases of the nervous system and sense organs: Secondary | ICD-10-CM | POA: Diagnosis not present

## 2014-12-03 DIAGNOSIS — Z79899 Other long term (current) drug therapy: Secondary | ICD-10-CM | POA: Diagnosis not present

## 2014-12-03 DIAGNOSIS — Z992 Dependence on renal dialysis: Secondary | ICD-10-CM | POA: Diagnosis not present

## 2014-12-03 DIAGNOSIS — E119 Type 2 diabetes mellitus without complications: Secondary | ICD-10-CM | POA: Insufficient documentation

## 2014-12-03 DIAGNOSIS — R21 Rash and other nonspecific skin eruption: Secondary | ICD-10-CM | POA: Diagnosis present

## 2014-12-03 DIAGNOSIS — E7800 Pure hypercholesterolemia, unspecified: Secondary | ICD-10-CM | POA: Diagnosis not present

## 2014-12-03 DIAGNOSIS — E669 Obesity, unspecified: Secondary | ICD-10-CM | POA: Insufficient documentation

## 2014-12-03 DIAGNOSIS — L509 Urticaria, unspecified: Secondary | ICD-10-CM | POA: Diagnosis not present

## 2014-12-03 DIAGNOSIS — Z794 Long term (current) use of insulin: Secondary | ICD-10-CM | POA: Diagnosis not present

## 2014-12-03 DIAGNOSIS — F329 Major depressive disorder, single episode, unspecified: Secondary | ICD-10-CM | POA: Diagnosis not present

## 2014-12-03 DIAGNOSIS — I10 Essential (primary) hypertension: Secondary | ICD-10-CM | POA: Insufficient documentation

## 2014-12-03 DIAGNOSIS — Z7982 Long term (current) use of aspirin: Secondary | ICD-10-CM | POA: Diagnosis not present

## 2014-12-03 DIAGNOSIS — Z87448 Personal history of other diseases of urinary system: Secondary | ICD-10-CM | POA: Insufficient documentation

## 2014-12-03 MED ORDER — HYDROCORTISONE 2.5 % EX LOTN: TOPICAL_LOTION | Freq: Two times a day (BID) | CUTANEOUS | Status: DC

## 2014-12-03 NOTE — ED Notes (Signed)
Pt. reports generalized itchy rashes onset Sunday afternoon unrelieved by OTC Benadryl and Zyrtec , respirations unlabored , denies fever or chills.

## 2014-12-03 NOTE — ED Provider Notes (Signed)
CSN: [id number]     Arrival date & time 12/03/14  2054 History  By signing my name below, I, Murriel Hopper, attest that this documentation has been prepared under the direction and in the presence of [employer], PA-C. Electronically Signed: Murriel Hopper, ED Scribe. 12/03/2014. 9:51 PM.    Chief Complaint  Patient presents with  . Rash      The history is provided by a relative and the patient. A language interpreter was used.   HPI Comments:  Joan Young is a 54 y.o. female with a hx of DM and renal disorder who presents to the Emergency Department complaining of constant, worsening itching rashes on her abdomen, legs, and back that has been present for two days. Her relative states she has been taking Benadryl and Zyrtec for her itching with little relief as recommended by staff at dialysis Center. Her relative states she has had no change in lotions, detergents, healthcare products, or medications. Pt denies fever, chills, chest pain, shortness of breath, difficulty breathing or swallowing, foreign body sensation in the throat, abdominal pain, urinary or bowel problems. No history of the same.   Past Medical History  Diagnosis Date  . Obesity   . Macular degeneration disease   . Depression   . High cholesterol Dx 2005  . Hypertension Dx 1194  . Diabetes mellitus without complication (HCC) DX 1969    type 11 since 54 years old but reports starting insulin at 54 years old  . Renal disorder   . Dialysis patient [facility])    Past Surgical History  Procedure Laterality Date  . Av fistula placement Left 12/19/2013    Procedure: Creation of Left Arm BRACHIOCEPHALIC ARTERIOVENOUS FISTULA ;  Surgeon: Fransisco Hertz, MD;  Location: [facility] OR;  Service: Vascular;  Laterality: Left;  . Back surgery     Family History  Problem Relation Age of Onset  . Hypertension Father   . Diabetes Father   . Kidney failure Father 43  . Kidney failure Sister 34  . Kidney failure Brother 30  . Kidney  failure Sister 57   Social History  Substance Use Topics  . Smoking status: Never Smoker   . Smokeless tobacco: Never Used  . Alcohol Use: No   OB History    No data available     Review of Systems  A complete 10 system review of systems was obtained and all systems are negative except as noted in the HPI and PMH.    Allergies  Hydromorphone and Tape  Home Medications   Prior to Admission medications   Medication Sig Start Date End Date Taking? Authorizing Provider  acetaminophen-codeine (TYLENOL #3) 300-30 MG per tablet Take 1 tablet by mouth every 4 (four) hours as needed. 11/11/14   Quentin Angst, MD  albuterol (PROVENTIL HFA;VENTOLIN HFA) 108 (90 BASE) MCG/ACT inhaler Inhale 2 puffs into the lungs every 6 (six) hours as needed for shortness of breath. And cough 07/08/14   Ambrose Finland, NP  amLODipine (NORVASC) 10 MG tablet Take 1 tablet (10 mg total) by mouth daily. 10/30/14   Ambrose Finland, NP  aspirin EC 81 MG tablet Take 81 mg by mouth daily.    Historical Provider, MD  calcium carbonate (OS-CAL - DOSED IN MG OF ELEMENTAL CALCIUM) 1250 (500 CA) MG tablet Take 1 tablet (500 mg of elemental calcium total) by mouth 3 (three) times daily with meals. 10/10/14   Vassie Loll, MD  carvedilol (COREG) 25 MG  tablet Take 1 tablet (25 mg total) by mouth 2 (two) times daily with a meal. 05/01/14   Ambrose Finland, NP  FLUoxetine (PROZAC) 40 MG capsule Take 1 capsule (40 mg total) by mouth daily. 10/10/14   Vassie Loll, MD  gabapentin (NEURONTIN) 100 MG capsule Take 1 capsule (100 mg total) by mouth 3 (three) times daily. Patient taking differently: Take 100 mg by mouth at bedtime.  08/27/14   Ambrose Finland, NP  glucose blood (TRUETEST TEST) test strip Use as instructed for 4 times daily blood glucose monitoring. 12/04/14   Quentin Angst, MD  guaiFENesin (MUCINEX) 600 MG 12 hr tablet Take 1 tablet (600 mg total) by mouth 2 (two) times daily. 10/10/14   Vassie Loll, MD   hydrALAZINE (APRESOLINE) 100 MG tablet TAKE 1 TABLET BY MOUTH 3 TIMES DAILY. 10/30/14   Ambrose Finland, NP  hydrocortisone 2.5 % lotion Apply topically 2 (two) times daily. 12/03/14   Eyvonne Mechanic, PA-C  Insulin Glargine (LANTUS SOLOSTAR) 100 UNIT/ML Solostar Pen Inject 15 Units into the skin every morning. 12/04/14   Quentin Angst, MD  insulin regular (NOVOLIN R,HUMULIN R) 100 units/mL injection Inject 0.03 mLs (3 Units total) into the skin 3 (three) times daily before meals. 11/27/14   Quentin Angst, MD  isosorbide mononitrate (IMDUR) 60 MG 24 hr tablet Take 1 tablet (60 mg total) by mouth daily. 10/30/14   Ambrose Finland, NP  losartan (COZAAR) 50 MG tablet Take 1 tablet (50 mg total) by mouth daily. 12/04/14   Quentin Angst, MD  nitroGLYCERIN (NITROSTAT) 0.4 MG SL tablet Place 1 tablet (0.4 mg total) under the tongue every 5 (five) minutes as needed for chest pain. 09/10/14   Rhetta Mura, MD  Nutritional Supplements (FEEDING SUPPLEMENT, NEPRO CARB STEADY,) LIQD Take 237 mLs by mouth 2 (two) times daily between meals. 10/10/14   Vassie Loll, MD  omeprazole (PRILOSEC) 20 MG capsule Take 1 capsule (20 mg total) by mouth daily. 10/30/14   Ambrose Finland, NP  ondansetron (ZOFRAN ODT) 8 MG disintegrating tablet Take 1 tablet (8 mg total) by mouth every 8 (eight) hours as needed for nausea or vomiting. 10/10/14   Vassie Loll, MD  pravastatin (PRAVACHOL) 40 MG tablet Take 40 mg by mouth daily.    Historical Provider, MD  saccharomyces boulardii (FLORASTOR) 250 MG capsule Take 1 capsule (250 mg total) by mouth 2 (two) times daily. 10/10/14   Vassie Loll, MD  sevelamer carbonate (RENVELA) 800 MG tablet Take 800 mg by mouth 3 (three) times daily with meals.    Historical Provider, MD  timolol (BETIMOL) 0.5 % ophthalmic solution Place 1 drop into the right eye daily.    Historical Provider, MD   BP 156/59 mmHg  Pulse 72  Temp(Src) 98.7 F (37.1 C) (Oral)  Resp 18  Wt 178 lb (80.74  kg)  SpO2 99%   Physical Exam  Constitutional: She is oriented to person, place, and time. She appears well-developed and well-nourished.  HENT:  Head: Normocephalic and atraumatic.  No swelling to the tongue mouth, posterior oropharynx, uvula midline and rises with phonation  Eyes: Conjunctivae are normal. Pupils are equal, round, and reactive to light. Right eye exhibits no discharge. Left eye exhibits no discharge. No scleral icterus.  Neck: Normal range of motion. No JVD present. No tracheal deviation present.  Cardiovascular: Normal rate, regular rhythm, normal heart sounds and intact distal pulses.  Exam reveals no friction rub.   No  murmur heard. Pulmonary/Chest: Effort normal and breath sounds normal. No stridor. No respiratory distress. She has no wheezes. She has no rales. She exhibits no tenderness.  Abdominal: She exhibits no distension.  Neurological: She is alert and oriented to person, place, and time. Coordination normal.  Skin: Skin is warm and dry.  Urticaria on back and left elbow   Fine rash to her chest and upper extremities   Psychiatric: She has a normal mood and affect. Her behavior is normal. Judgment and thought content normal.  Nursing note and vitals reviewed.   ED Course  Procedures (including critical care time) DIAGNOSTIC STUDIES: Oxygen Saturation is 99% on room air, normal by my interpretation.    COORDINATION OF CARE: 9:47 PM Discussed treatment plan with pt at bedside and pt agreed to plan.    Labs Review Labs Reviewed - No data to display  Imaging Review No results found. I have personally reviewed and evaluated these images and lab results as part of my medical decision-making.   EKG Interpretation None      MDM   Final diagnoses:  Urticaria    Labs:  Imaging:  Consults:  Therapeutics:  Discharge Meds:   Assessment/Plan: Patient presentation most likely represents an allergic type reaction. Patient has tried Benadryl  with no improvement in symptoms. Patient has no airway involvement, uncertain etiology 2 allergic reaction. Patient is a dialysis patient, diabetic. She will be discharged home with Benadryl, Claritin, Zyrtec as needed, and topical steroids. No oral steroids given due to patient's significant past medical history. She is encouraged to follow up with her primary care provider tomorrow for reevaluation and further management. She is given strict return precautions, she verbalized understanding and agreement for today's plan. Patient's daughter was used as a Nurse, learning disability at her request   I personally performed the services described in this documentation, which was scribed in my presence. The recorded information has been reviewed and is accurate.         Eyvonne Mechanic, PA-C 12/04/14 2132  Margarita Grizzle, MD 12/05/14 [phone]

## 2014-12-03 NOTE — Discharge Instructions (Signed)
Alergias (Allergies) Joan Young es una reaccin anormal del sistema de defensa del cuerpo (sistema inmunitario) ante una sustancia. Las Oncologist a Actuary. CULES SON LAS CAUSAS DE LAS ALERGIAS? La reaccin alrgica se produce cuando el sistema inmunitario, por equivocacin, reacciona ante una sustancia normalmente inocua, llamada alrgeno, como si fuera perjudicial. El sistema inmunitario libera anticuerpos para combatir la sustancia. Con el tiempo, los anticuerpos liberan una sustancia qumica llamada histamina en el torrente sanguneo. La liberacin de histamina tiene como fin proteger al cuerpo de la infeccin, pero tambin causa molestias. Cualquiera de estas acciones puede desencadenar una reaccin alrgica:  Comer un alrgeno.  Inhalar un alrgeno.  Tocar un alrgeno. CULES SON LAS CLASES DE ALERGIAS? Hay muchas clases de alergias. Entre las ms frecuentes, se incluyen las siguientes:  Theatre manager. Por lo general, esta clase de alergia se produce por sustancias que solo estn presentes durante determinadas estaciones, por ejemplo, el moho y el polen.  Alergias a los alimentos.  Alergias a los medicamentos.  Alergias a los insectos.  Alergias a la caspa de [employer]. CULES SON LOS SNTOMAS DE LAS ALERGIAS? Entre los posibles sntomas de la [place], se incluyen los siguientes:  Hinchazn de los labios, la cara, la [place], la boca o la garganta.  Estornudos, tos o sibilancias.  Congestin nasal.  Hormigueo en la boca.  Erupcin cutnea.  Picazn.  Zonas de piel hinchadas, rojas y que producen picazn (ronchas).  Lagrimeo.  Vmitos.  Diarrea.  Mareos.  Sensacin de desvanecimiento.  Desmayos.  Problemas para respirar o tragar.  Opresin en el pecho.  Latidos cardacos rpidos. CMO SE DIAGNOSTICAN LAS ALERGIAS? Las [employer] se diagnostican en funcin de los antecedentes mdicos y familiares, y mediante uno o ms  de estos elementos:  Pruebas cutneas.  Anlisis de [place].  Un registro de alimentos. Un registro de [employer] todos los alimentos y las bebidas que usted consume en un da, y todos los sntomas que experimenta.  Los resultados de una dieta de eliminacin. Una dieta de eliminacin implica eliminar alimentos de la dieta y luego incorporarlos nuevamente, uno a la vez, para averiguar si hay uno en particular que le cause Runner, broadcasting/film/video. CMO SE TRATAN LAS ALERGIAS? No hay una cura para las [place], pero las reacciones alrgicas pueden tratarse con medicamentos. Generalmente, las reacciones graves deben tratarse en un hospital. CMO PUEDEN PREVENIRSE LAS REACCIONES? La mejor manera de prevenir una reaccin alrgica es evitar la sustancia que le causa alergia. Las vacunas y los medicamentos para la alergia tambin pueden ayudar a prevenir las reacciones en [employer]. Las personas con Therapist, art graves pueden prevenir una reaccin potencialmente mortal llamada anafilaxis con la administracin inmediata de un medicamento despus de la exposicin al alrgeno.   Esta informacin no tiene Theme park manager el consejo del mdico. Asegrese de hacerle al mdico cualquier pregunta que tenga.   Document Released: 02/15/2005 Document Revised: 03/08/2014 Elsevier Interactive Patient Education 2016 [employer].  Ronchas  (Hives)  Las ronchas son reas de la piel inflamadas (hinchadas) rojas y que pican. Pueden cambiar de tamao y de ubicacin en el cuerpo. Las Armed forces operational officer y Geneticist, molecular durante algunas horas o das (ronchas agudas) o durante algunas semanas (ronchas crnicas). No pueden transmitirse de [place] persona a Theodoro Clock (no son contagiosas). Pueden empeorar al rascarse, hacer ejercicios y por estrs emocional.  CAUSAS   Reaccin alrgica a alimentos, aditivos o frmacos.  Infecciones, incluso el resfro comn.  Enfermedades, como la vasculitis,  el lupus o  la enfermedad tiroidea.  Exposicin al sol, al calor o al fro.  La prctica de ejercicios.  El estrs.  El contacto con algunas sustancias qumicas. SNTOMAS   Zonas hinchadas, rojas o blancas, sobre la piel. Las ronchas pueden cambiar de [place], forma, [place] y Armed forces logistics/support/administrative officer.  Picazn.  Hinchazn de las [employer] y [place]. Esto puede ocurrir si las ronchas se desarrollan en capas profundas de la piel. DIAGNSTICO  El mdico puede diagnosticar el problema haciendo un examen fsico. Conley Rolls indicar anlisis de sangre o un estudio de la piel para Production assistant, radio causa. En algunos casos, no puede determinarse la causa.  TRATAMIENTO  Los casos leves generalmente mejoran con medicamentos como los antihistamnicos. Los casos ms graves pueden requerir una inyeccin de epinefrina de Associate Professor. Si se conoce la causa de la urticaria, el tratamiento incluye evitar el factor desencadenante.  INSTRUCCIONES PARA EL CUIDADO EN EL HOGAR   Evite las causas que han desencadenado las ronchas.  Tome los antihistamnicos segn las indicaciones del mdico para reducir la gravedad de las ronchas. Generalmente se recomiendan los [employer] no son sedantes o con bajo efecto sedante. No conduzca vehculos mientras toma antihistamnicos.  Tome los medicamentos para la picazn exactamente como le indic el mdico.  Use ropas sueltas.  Cumpla con todas las visitas de control, segn le indique su mdico. SOLICITE ATENCIN MDICA SI:   Siente una picazn intensa o persistente que no se calma con los medicamentos.  Le duelen las articulaciones o estn inflamadas. SOLICITE ATENCIN MDICA DE INMEDIATO SI:   Tiene fiebre.  Tiene la boca o los labios hinchados.  Tiene problemas para respirar o tragar.  Siente una opresin en la garganta o en el pecho.  Siente dolor abdominal. Estos problemas pueden ser los primeros signos de una reaccin alrgica que ponga en peligro  la vida. Llame a los servicios de emergencia locales (911 en los [place]). ASEGRESE DE QUE:   Comprende estas instrucciones.  Controlar su enfermedad.  Solicitar ayuda de inmediato si no mejora o si empeora.   Esta informacin no tiene Theme park manager el consejo del mdico. Asegrese de hacerle al mdico cualquier pregunta que tenga.   Document Released: 02/15/2005 Document Revised: 02/20/2013 Elsevier Interactive Patient Education [employer]   Please use medication as directed, please follow-up with her primary care for reevaluation. Please return immediately if new or worsening signs or symptoms present. Please use Claritin and Zyrtec during the day, Benadryl at night.

## 2014-12-04 ENCOUNTER — Ambulatory Visit: Payer: BLUE CROSS/BLUE SHIELD | Attending: Internal Medicine | Admitting: Pharmacist

## 2014-12-04 DIAGNOSIS — E1022 Type 1 diabetes mellitus with diabetic chronic kidney disease: Secondary | ICD-10-CM

## 2014-12-04 DIAGNOSIS — N189 Chronic kidney disease, unspecified: Secondary | ICD-10-CM | POA: Diagnosis not present

## 2014-12-04 DIAGNOSIS — E1065 Type 1 diabetes mellitus with hyperglycemia: Secondary | ICD-10-CM | POA: Insufficient documentation

## 2014-12-04 MED ORDER — GLUCOSE BLOOD VI STRP: ORAL_STRIP | Status: DC

## 2014-12-04 MED ORDER — INSULIN GLARGINE 100 UNIT/ML SOLOSTAR PEN: 15.0000 [IU] | PEN_INJECTOR | Freq: Every morning | SUBCUTANEOUS | Status: DC

## 2014-12-04 MED ORDER — LOSARTAN POTASSIUM 50 MG PO TABS: 50.0000 mg | ORAL_TABLET | Freq: Every day | ORAL | Status: DC

## 2014-12-04 NOTE — Patient Instructions (Addendum)
Thank you for coming to see me today!  Increase your Lantus to 15 units every day  Keep taking the regular insulin 3 units with each meal  Come back and see me in 1-2 weeks  Hipoglucemia (Hypoglycemia) La hipoglucemia se produce cuando el nivel de glucosa en la sangre es demasiado bajo. La glucosa es un tipo de azcar, que es la principal fuente de energa del cuerpo. Hormonas, como la insulina y Oncologist, Systems developer el nivel de glucosa en la [place]. La insulina reduce el nivel de la glucosa en la sangre, mientras que el glucagn lo [place]. Si tiene demasiada insulina en el torrente sanguneo o si no ingiere suficientes alimentos que contengan azcar, puede desarrollar hipoglucemia. Esta afeccin puede manifestarse en personas con o sin diabetes. Puede desarrollarse rpidamente y, como consecuencia, necesitar atencin urgente.  CAUSAS   Omitir o retrasar comidas.  No ingerir demasiados carbohidratos en las comidas.  Consumo excesivo de medicamentos para la diabetes.  No coordinar el horario de la toma de medicamentos por va oral para la diabetes o de insulina, con las comidas, las colaciones y Agricultural consultant.  Nuseas y vmitos.  Algunos medicamentos.  Enfermedades graves, como hepatitis, trastornos renales y ciertos trastornos de Psychologist, sport and exercise.  Aumento de la actividad fsica o el ejercicio, sin ingerir alimentos adicionales o ajustar los medicamentos.  Beber alcohol en exceso.  Un trastorno nervioso que afecta las funciones corporales, como la frecuencia cardaca, presin arterial y digestin (neuropata [place]).  Una afeccin en la cual los msculos del estmago no funcionan apropiadamente (gastroparesia). Por consiguiente, los medicamentos y los alimentos no pueden absorberse Merchandiser, retail.  Pocas veces un tumor de pncreas puede producir demasiada insulina. SNTOMAS   Hambre.  Sudoraciones (diaforesis).  Cambio en la Arts development officer.  Temblores.  Dolor de  [place].  Ansiedad.  Aturdimiento.  Irritabilidad.  Dificultad para concentrarse.  Sequedad en la boca.  Hormigueo o adormecimiento de las manos y los pies.  Sueo agitado o alteraciones del sueo.  Alteracin en el habla y la coordinacin.  Cambio en el estado mental.  Convulsiones breves o prolongadas.  Agresividad  Somnolencia (letargo).  Debilidad.  Aumento de la frecuencia cardaca o palpitaciones.  Confusin.  Piel plida o de [employer].  Visin borrosa o doble.  Desmayos. DIAGNSTICO  Le harn un examen fsico y [place] historia clnica. Su mdico puede hacer un diagnstico en funcin de sus sntomas. Pueden realizarle anlisis de sangre y otras pruebas de laboratorio para Pharmacist, hospital diagnstico. Una vez realizado el diagnstico, su mdico observar si los signos y sntomas desaparecen, una vez que aumenta el nivel de la glucosa en la [place].  TRATAMIENTO  Por lo general, la hipoglucemia puede tratarse fcilmente cuando se observan sntomas.  Controle su nivel de glucosa en la sangre. Si es menor que 70 mg/dl, tome uno de los siguientes:  3 o 4 comprimidos de glucosa.   taza de jugo.   taza de una gaseosa comn.  1 taza de [employer].   a 1 pomo de glucosa en gel.  5 a 6 caramelos duros.  Evite las bebidas o los alimentos con alto contenido de grasa, que pueden retrasar el aumento de los niveles de glucosa en la [place].  No ingiera ms de la cantidad recomendada de alimentos, bebidas, gel o comprimidos que contengan azcar. Si lo hace, el nivel de glucosa en la sangre subir demasiado.  Espere de 10 a 15 minutos y vuelva a controlar su nivel de glucosa en la  sangre. Si an es Scientist, water quality 70 mg/dl o est por debajo del intervalo indicado, repita el tratamiento.  Ingiera una colacin si falta ms de 1 hora para su prxima comida. Es posible que, [address], su nivel de glucosa en la sangre baje Paige, de modo que no pueda tratarse en su  casa, cuando comience a observar los sntomas. Probablemente necesite ayuda. Incluso puede desmayarse o ser incapaz de tragar. Si no puede tratarse por s solo, alguien Customer service manager al hospital.  INSTRUCCIONES PARA EL CUIDADO EN EL HOGAR  Si tiene diabetes, siga su plan de control de la diabetes:  Tome los medicamentos segn las indicaciones.  Siga el plan de ejercicio.  Siga el plan de comidas. No saltee comidas. Coma a horario.  Controle su nivel de glucosa en la sangre peridicamente. Controle su nivel de glucosa en la sangre antes y despus de ejercitarse. Si hace ejercicio durante ms tiempo o de [employer] de lo habitual, asegrese de Chief Operating Officer su nivel de glucosa en la sangre con mayor frecuencia.  Use su pulsera o medalla de alerta mdica, que indica que usted tiene diabetes.  Identifique la causa de su hipoglucemia. Luego, desarrolle formas de prevenir la recurrencia de la hipoglucemia.  No tome un bao o una ducha caliente inmediatamente despus de una inyeccin de insulina.  Siempre lleve [employer] Devices. Las pastillas de glucosa son fciles de Midwife.  Si va a beber alcohol, bbalo solo con las comidas.  Informe a familiares y [place] qu pueden hacer para mantenerlo seguro durante una convulsin. Esto puede incluir retirar [employer] duros o filosos del rea o colocarlo de costado.  Mantenga un peso saludable. SOLICITE ATENCIN MDICA SI:   Tiene problemas para Futures trader de glucosa en la sangre dentro del intervalo indicado.  Tiene episodios frecuentes de hipoglucemia.  Siente efectos secundarios por los medicamentos prescritos.  No est seguro por qu su nivel de glucosa en la sangre es tan bajo.  Nota cambios o un nuevo problema en la visin . SOLICITE ATENCIN MDICA DE INMEDIATO SI:   Presenta confusin.  Se produce un cambio en su estado mental.  Es incapaz de tragar.  Se desmaya.   Esta informacin no tiene Theme park manager el  consejo del mdico. Asegrese de hacerle al mdico cualquier pregunta que tenga.   Document Released: 02/15/2005 Document Revised: 02/20/2013 Elsevier Interactive Patient Education [employer]

## 2014-12-04 NOTE — Progress Notes (Signed)
S:    Patient arrives in a pleasant mood with her daughter who translates for her. Presents for diabetes management. Long-standing T1DM.    Patient reports adherence with medications. Current diabetes medications include Lantus 10 units daily and humulin R 3 units with meals  Patient denies hypoglycemic events.  Patient reported dietary habits: patient has worked hard to eat three meals each day to prevent hypoglycemia  Patient reported exercise habits: Does not exercise regularly.   Patient reports nocturia once or twice a night. Patient reports neuropathy. Patient denies visual changes.  Patient has a blister on the bottom of her right foot. She just noticed it yesterday and it popped after she cleaned her feet in the shower. She reports that it is painful but that it has not been bleeding or oozing since yesterday.   O:  Lab Results  Component Value Date   HGBA1C 8.9* 10/07/2014    Home fasting CBG: 158-350 2 hour post-prandial/random CBG: 195-500.  Blister on bottom of right foot, near big toe. Not oozing or bleeding. Not erythematous.   A/P: Long-standing T1DM: currently uncontrolled based on A1c of 8.9. Patient denies hypoglycemic events and is able to verbalize appropriate hypoglycemia management plan.  Patient reports adherence with medication. Control is suboptimal due to sedentary lifestyle and dietary indiscretion. Overall, patient seems to understand her regimen much better after extensive education last week.  Increase Lantus to 15 units daily. Patient will continue to use 3 units of regular insulin with meals. She used to be on a higher dose of NPH (8 units qAM and 20 units qPM) so likely has a higher basal insulin requirement. Once fasting blood glucoses are at goal, will focus on the post-prandial blood glucoses and regular insulin adjustment.  Wound: Patient could not be seen in walk-in clinic today. Instructed to follow up with primary care provider. Told patient  to keep her feet clean, dry, and the bandage the area when out but to let it be exposed to air while at home. Instructed on warning signs of infection, such as oozing, erythema, fever, chills, etc and to return to clinic immediately if any of these occur. Patient will follow up with Dr. Hyman Hopes.  Next A1C anticipated November 2016.    Written patient instructions provided. Total time in face to face counseling 35 minutes. Follow up in Pharmacist Clinic Visit in 1-2 weeks unless seen by Dr. Hyman Hopes in that time period.

## 2014-12-11 ENCOUNTER — Ambulatory Visit: Payer: BLUE CROSS/BLUE SHIELD | Attending: Internal Medicine | Admitting: Pharmacist

## 2014-12-11 ENCOUNTER — Ambulatory Visit: Payer: BLUE CROSS/BLUE SHIELD | Admitting: Internal Medicine

## 2014-12-11 DIAGNOSIS — Z794 Long term (current) use of insulin: Secondary | ICD-10-CM | POA: Insufficient documentation

## 2014-12-11 DIAGNOSIS — E1022 Type 1 diabetes mellitus with diabetic chronic kidney disease: Secondary | ICD-10-CM

## 2014-12-11 DIAGNOSIS — E1065 Type 1 diabetes mellitus with hyperglycemia: Secondary | ICD-10-CM | POA: Diagnosis not present

## 2014-12-11 DIAGNOSIS — E109 Type 1 diabetes mellitus without complications: Secondary | ICD-10-CM | POA: Insufficient documentation

## 2014-12-11 DIAGNOSIS — N189 Chronic kidney disease, unspecified: Secondary | ICD-10-CM | POA: Diagnosis not present

## 2014-12-11 MED ORDER — INSULIN REGULAR HUMAN 100 UNIT/ML IJ SOLN: 5.0000 [IU] | Freq: Three times a day (TID) | INTRAMUSCULAR | Status: DC

## 2014-12-11 NOTE — Progress Notes (Signed)
S:    Patient arrives in a pleasant mood with her daughter who translates for her. Presents for diabetes management. Long-standing T1DM.    Patient reports adherence with medications. Current diabetes medications include Lantus 15 units daily and humulin R 3 units with meals  Patient denies hypoglycemic events.  Patient reported dietary habits: continues to work hard to eat three meals each day to prevent hypoglycemia  Patient reported exercise habits: Does not exercise regularly.   Patient reports nocturia once or twice a night. Patient reports neuropathy. Patient denies visual changes.  Patient reports that the blister on the bottom of her foot has improved and has mostly healed.  O:  Lab Results  Component Value Date   HGBA1C 8.9* 10/07/2014    Home fasting CBG: 97 - 209 2 hour post-prandial/random CBG: 165-309, one excursion to 500.  A/P: Long-standing T1DM: currently uncontrolled based on A1c of 8.9 but improving based on home readings. Patient denies hypoglycemic events and is able to verbalize appropriate hypoglycemia management plan.  Patient reports adherence with medication. Control is suboptimal due to sedentary lifestyle and dietary indiscretion.   Continue Lantus 15 units daily. Patient to increase to 5 units of regular insulin with meals. Because of the elevated post-prandials up to 500, I will adjust the bolus insulin now, expecting that it will also decrease the fastings. Expecting that this increase in bolus dosing will avoid excursions, will return to focusing on getting fasting blood glucose to goal first, at next visit.   Wound: Healing. Told patient to keep her feet clean, dry, and the bandage the area when out but to let it be exposed to air while at home. Instructed on warning signs of infection, such as oozing, erythema, fever, chills, etc and to return to clinic immediately if any of these occur. Patient will follow up with Dr. Hyman HopesJegede.  Next A1C anticipated  November 2016.    Written patient instructions provided. Total time in face to face counseling 20 minutes. Follow up in Pharmacist Clinic Visit in 1-2 weeks.

## 2014-12-11 NOTE — Patient Instructions (Signed)
Your blood sugars are looking better!  Continue Lantus 15 units every day  Increase your regular insulin to 5 units with each meal.  Come back and see me in 1-2 weeks.

## 2014-12-16 ENCOUNTER — Ambulatory Visit: Payer: BLUE CROSS/BLUE SHIELD | Admitting: Internal Medicine

## 2014-12-16 ENCOUNTER — Other Ambulatory Visit: Payer: Self-pay | Admitting: *Deleted

## 2014-12-16 MED ORDER — GLUCOSE BLOOD VI STRP: ORAL_STRIP | Status: DC

## 2014-12-16 NOTE — Telephone Encounter (Signed)
Patients test strips changed to Insurance approved.

## 2014-12-25 ENCOUNTER — Ambulatory Visit: Payer: BLUE CROSS/BLUE SHIELD | Attending: Internal Medicine | Admitting: Pharmacist

## 2014-12-25 ENCOUNTER — Telehealth: Payer: Self-pay | Admitting: Internal Medicine

## 2014-12-25 DIAGNOSIS — N189 Chronic kidney disease, unspecified: Secondary | ICD-10-CM | POA: Diagnosis not present

## 2014-12-25 DIAGNOSIS — E1022 Type 1 diabetes mellitus with diabetic chronic kidney disease: Secondary | ICD-10-CM | POA: Diagnosis not present

## 2014-12-25 DIAGNOSIS — E1065 Type 1 diabetes mellitus with hyperglycemia: Secondary | ICD-10-CM | POA: Diagnosis not present

## 2014-12-25 NOTE — Progress Notes (Signed)
S:    Patient arrives in a pleasant mood with her daughter who translates for her. Presents for diabetes management. Long-standing T1DM.    Patient reports adherence with medications. Current diabetes medications include Lantus 15 units daily and humulin R 5 units with meals.  Patient reports overall that she feels much better and attributes this to lower blood glucoses.   Patient reports 1 hypoglycemic event. She had a reading of 45 on 12/13/14 in the evening. She reports that she skipped a meal but still took her regular insulin.   Patient reported dietary habits: continues to work hard to eat three meals each day to prevent hypoglycemia  Patient reported exercise habits: Does not exercise regularly.   Patient reports nocturia once or twice a night. Patient reports neuropathy. Patient denies visual changes.  Patient reports that the blister on the bottom of her foot has completely healed.  O:  Lab Results  Component Value Date   HGBA1C 8.9* 10/07/2014    Home fasting CBG: 92 - 150 (most in 110s-120s) 2 hour post-prandial/random CBG: < 180, two excursions 287 and 300  A/P: Long-standing T1DM: currently uncontrolled based on A1c of 8.9 but improving based on home readings. Patient reports hypoglycemic events and is able to verbalize appropriate hypoglycemia management plan.  Patient reports adherence with medication. Control is suboptimal due to sedentary lifestyle and dietary indiscretion.   Congratulated patient on progress. Continue Lantus 15 units daily and insulin regular 5 units with meals. Instructed patient to skip the insulin regular dose if she ever skips a meal to prevent hypoglycemia. Provided written information on blood glucose monitoring and reviewed goals for fasting and post-prandial readings. Patient to return to see me if her blood glucoses start to increase or if she has multiple episodes of hypoglycemia.  Wound: Healed. Encouraged continual foot care and to let  us know if she ever develops another wound.  Next A1C anticipated November 2016.    Written patient instructions provided. Total time in face to face counseling 20 minutes. Follow up with Dr. Hyman HopesJegede for next visit.

## 2014-12-25 NOTE — Patient Instructions (Signed)
Your blood sugars are doing much better!  Keep taking Lantus 15 units and regular insulin 5 units  Skip the regular insulin with meals if you skip a meal.  Come back and see me if you see your blood sugars start to go back up.  Otherwise, next visit with Dr. Hyman Hopes  Control del nivel de glucosa en la sangre - Adultos (Blood Glucose Monitoring, Adult) El control de la glucosa en la sangre (tambin llamada azcar en la sangre) lo ayudar a tener la diabetes bajo control. Tambin ayuda a que usted y Lexicographer la diabetes y determinen si el tratamiento es Engineer, manufacturing. POR QU HAY QUE CONTROLAR LA GLUCOSA EN LA SANGRE?  Esto puede ayudar a comprender de [employer], la actividad fsica y los medicamentos inciden en los niveles de [place].  Le permite conocer el nivel de glucosa en la sangre en cualquier momento dado. Puede saber rpidamente si el nivel es bajo (hipoglucemia) o alto (hiperglucemia).  Puede ser de ayuda para que usted y el mdico sepan cmo Presenter, broadcasting,  y para entender cmo controlar una enfermedad o ajustar los medicamentos para hacer ejercicio. CUNDO DEBE HACERSE LAS PRUEBAS? El mdico lo ayudar a decidir con qu frecuencia deber [employer] niveles de glucosa en la [place]. Esto puede depender del tipo de diabetes que tenga, su control de la diabetes o los tipos de medicamentos que tome. Asegrese de anotar todos los valores de la glucosa en la [place], de modo que esta informacin pueda ser revisada por su mdico. A continuacin puede ver ejemplos de los momentos para Education officer, environmental la prueba que el mdico puede Neurosurgeon. Diabetes tipo1  Mdaselo al menos 2 veces al da si la diabetes est bien controlada, si [place] una bomba de insulina o si se aplica muchas inyecciones diarias.  Si la diabetes no est bien controlada o si est enfermo, puede ser necesario que se controle con ms frecuencia.  Es recomendable que tambin lo mida en estas  oportunidades:  Antes de cada inyeccin de insulina.  Antes y despus de hacer ejercicio.  Entre las comidas y 2horas despus de Arts administrator.  Ocasionalmente, entre las 2:00a.m. y las 3:00a.m. Diabetes tipo2  Si est utilizando insulina, realice la medicin al menos 2 veces al [employer]. Sin embargo, es Insurance claims handler medicin antes de cada inyeccin de [place].  Si toma medicamentos por boca (va oral), hgase la prueba 2veces por da.  Si sigue una dieta controlada, hgase la prueba una vez por da.  Si la diabetes no est bien controlada o si est enfermo, puede ser necesario que se controle con ms frecuencia. CMO CONTROLAR EL NIVEL DE GLUCOSA EN LA SANGRE Insumos necesarios  Medidor de glucosa en la sangre.  Tiras reactivas para el medidor. Cada medidor tiene sus propias tiras reactivas. Debe usar las tiras reactivas correspondientes a su medidor.  Una aguja para pinchar (lanceta).  Un dispositivo que sujeta la lanceta (dispositivo de puncin).  Un diario o libro de anotaciones para [employer]. Procedimiento  Lave sus manos con agua y [place]. No se recomienda usar alcohol.  Pnchese el costado del dedo (no la punta) con Optometrist.  Apriete suavemente el dedo hasta que aparezca una pequea gota de [place].  Siga las instrucciones que vienen con el medidor para Public affairs consultant tira Firefighter, Contractor la sangre sobre la tira y usar el medidor de Horticulturist, commercial. Otras zonas de las que se puede tomar sangre para la prueba  Algunos medidores le permiten tomar sangre para la prueba de otras zonas del cuerpo (que no son el dedo). Estas reas se llaman sitios alternativos. Los sitios alternativos ms comunes son los siguientes:  El Product managerantebrazo.  El muslo.  La zona posterior de la parte inferior de la pierna.  La palma de la mano. El flujo de sangre en estas zonas es ms lento. Por lo tanto, los valores de glucosa en la sangre que obtenga pueden estar demorados, y  los nmeros son diferentes de los que obtiene de los dedos. No saque sangre de sitios alternativos si cree que tiene hipoglucemia. Los valores no sern precisos. Siempre extraiga del dedo si tiene hipoglucemia. Adems, si no puede darse cuenta cuando tiene bajos los niveles (hipoglucemia asintomtica), siempre extraiga sangre de los dedos para los controles de glucosa en la [place]. CONSEJOS ADICIONALES PARA EL CONTROL DE LA GLUCOSA  No vuelva a utilizar las lancetas.  Siempre tenga los insumos a mano.  Todos los medidores de glucosa incluyen un nmero de telfono "directo", disponible las 24 horas, al que podr llamar si tiene preguntas o French Southern Territoriesnecesita ayuda.  Ajuste (calibre) el medidor de glucosa con una solucin de control despus de terminar algunas cajas de tiras reactivas. LLEVE REGISTROS DE LOS NIVELES DE GLUCOSA EN LA SANGRE Es recomendable llevar un diario o un registro de los valores de glucosa en la [place]. La Harley-Davidsonmayora de los medidores de glucosa, sino todos, conservan el registro de la glucosa en el dispositivo. Algunos medidores permiten descargar los registros a su computadora. Llevar un registro de los valores de glucosa en la sangre es especialmente til si desea observar los patrones. Haga anotaciones simultneas con la Microbiologistlectura de los valores de glucosa en la sangre debido a que podra olvidar lo que ocurri en el momento exacto. Llevar un buen registro los ayudar a usted y al mdico a Printmakertrabajar juntos para Personnel officerlograr un buen control de la diabetes.    Esta informacin no tiene Theme park managercomo fin reemplazar el consejo del mdico. Asegrese de hacerle al mdico cualquier pregunta que tenga.   Document Released: 02/15/2005 Document Revised: 03/08/2014 Elsevier Interactive Patient Education [employer]2016 Elsevier Inc.

## 2014-12-25 NOTE — Telephone Encounter (Signed)
Pt. Came requesting a referral to get an MRI done. Pt. Was since here on 11/11/14 for a fistula on right arm. Pt. Is still experiencing pain. Pt. receives dialysis and was told to come and get an MRI. Please f/u with pt.

## 2015-01-01 ENCOUNTER — Telehealth: Payer: Self-pay | Admitting: Internal Medicine

## 2015-01-01 NOTE — Telephone Encounter (Signed)
Pt. Came in requesting a med refill on FLUoxetine (PROZAC) 40 MG capsule. Please f/u with pt.

## 2015-01-01 NOTE — Telephone Encounter (Signed)
Patients medication Prozac was not prescribed by our provider. Patient needs to obtain refill from prescribing doctor.

## 2015-01-31 ENCOUNTER — Telehealth: Payer: Self-pay | Admitting: *Deleted

## 2015-01-31 ENCOUNTER — Other Ambulatory Visit: Payer: Self-pay | Admitting: Internal Medicine

## 2015-01-31 NOTE — Telephone Encounter (Signed)
Medical Assistant used [employer] to contact patient.  Interpreter Name:Ceilo Interpreter #: [phone]   Medical Assistant left message on patient's home and cell voicemail. Voicemail states to give a call back to [place] [facility] at [phone].  Please ask patient which brand Glucometer does she use to check her blood sugar.

## 2015-02-06 NOTE — Telephone Encounter (Signed)
Patients daughter called to speak to PCP in regards to the patients new dialysis procedure. Please f/u with pt.

## 2015-02-19 ENCOUNTER — Other Ambulatory Visit: Payer: Self-pay

## 2015-02-19 ENCOUNTER — Telehealth: Payer: Self-pay

## 2015-02-19 ENCOUNTER — Telehealth: Payer: Self-pay | Admitting: Internal Medicine

## 2015-02-19 ENCOUNTER — Other Ambulatory Visit: Payer: Self-pay | Admitting: Internal Medicine

## 2015-02-19 MED ORDER — GABAPENTIN 100 MG PO CAPS: 100.0000 mg | ORAL_CAPSULE | Freq: Three times a day (TID) | ORAL | Status: DC

## 2015-02-19 NOTE — Telephone Encounter (Signed)
Joan Young-patient's nurse called requesting a refill on her gabapentin RX sent to [facility] on [address] per request

## 2015-02-19 NOTE — Telephone Encounter (Signed)
Pt requesting refill of FLUoxetine (PROZAC) 40 MG capsule Pt calling back to make appt in Jan

## 2015-02-21 NOTE — Telephone Encounter (Signed)
Interpreter Name: Jaymes GraffRuben Interpreter #:[id number]  Medical Assistant used [employer] to contact patient.  Medical Assistant left message on patient's home and cell voicemail. Voicemail states to give a call back to [place] with [facility] at [phone].

## 2015-02-26 ENCOUNTER — Other Ambulatory Visit: Payer: Self-pay | Admitting: Internal Medicine

## 2015-02-26 MED ORDER — GLUCOSE BLOOD VI STRP: ORAL_STRIP | Status: AC

## 2015-03-03 MED FILL — ?AMLODIPINE BESYLATE 10 MG: 10 | 30 days supply | Qty: 30 | Fill #1

## 2015-03-03 MED FILL — raNITIdine HCL 150 MG TABS: 150 | 30 days supply | Qty: 30 | Fill #4

## 2015-03-04 ENCOUNTER — Other Ambulatory Visit: Payer: Self-pay | Admitting: Internal Medicine

## 2015-03-04 ENCOUNTER — Other Ambulatory Visit: Payer: Self-pay | Admitting: Pharmacist

## 2015-03-04 MED ORDER — TRUE METRIX METER W/DEVICE KIT: PACK | Status: AC

## 2015-03-04 MED FILL — ?OMEPRAZOLE DR 20 MG CAPSUL: 20 | 30 days supply | Qty: 30 | Fill #0

## 2015-03-04 MED FILL — !TRUE METRIX BLOOD GLUCOSE: 365 days supply | Qty: 1 | Fill #0

## 2015-03-04 NOTE — Telephone Encounter (Signed)
Medication Refill: losartan (COZAAR) 50 MG tablet  Pt. Would like a sample of medication, she has scheduled an appointment for later in the month

## 2015-03-05 ENCOUNTER — Other Ambulatory Visit: Payer: Self-pay | Admitting: *Deleted

## 2015-03-05 DIAGNOSIS — E785 Hyperlipidemia, unspecified: Secondary | ICD-10-CM

## 2015-03-05 MED ORDER — LOSARTAN POTASSIUM 50 MG PO TABS
50.0000 mg | ORAL_TABLET | Freq: Every day | ORAL | Status: AC
Start: 2015-03-05 — End: ?

## 2015-03-05 MED FILL — LOSARTAN POTASSIUM 50 MG TA: 50 | 30 days supply | Qty: 30 | Fill #0

## 2015-03-05 MED FILL — ISOSORBIDE MN ER 60 MG TAB: 60 | 30 days supply | Qty: 30 | Fill #0

## 2015-03-05 MED FILL — ?CARVEDILOL 25 MG TABLET: 25 | 30 days supply | Qty: 60 | Fill #0

## 2015-03-05 NOTE — Telephone Encounter (Signed)
Patient last seen on 11/11/14. Patients medication was refilled with 2 refills.

## 2015-03-17 MED FILL — hydrALAZINE HCL 100 MG TABS: 100 | 30 days supply | Qty: 90 | Fill #0

## 2015-03-20 ENCOUNTER — Ambulatory Visit: Payer: BLUE CROSS/BLUE SHIELD | Attending: Internal Medicine | Admitting: Internal Medicine

## 2015-03-20 ENCOUNTER — Encounter: Payer: Self-pay | Admitting: Internal Medicine

## 2015-03-20 VITALS — BP 193/93 | HR 85 | Temp 98.4°F | Resp 18 | Ht 61.0 in | Wt 172.8 lb

## 2015-03-20 DIAGNOSIS — Z7982 Long term (current) use of aspirin: Secondary | ICD-10-CM | POA: Diagnosis not present

## 2015-03-20 DIAGNOSIS — E7800 Pure hypercholesterolemia, unspecified: Secondary | ICD-10-CM | POA: Diagnosis not present

## 2015-03-20 DIAGNOSIS — I120 Hypertensive chronic kidney disease with stage 5 chronic kidney disease or end stage renal disease: Secondary | ICD-10-CM | POA: Diagnosis not present

## 2015-03-20 DIAGNOSIS — IMO0002 Reserved for concepts with insufficient information to code with codable children: Secondary | ICD-10-CM

## 2015-03-20 DIAGNOSIS — E1065 Type 1 diabetes mellitus with hyperglycemia: Secondary | ICD-10-CM | POA: Diagnosis not present

## 2015-03-20 DIAGNOSIS — D649 Anemia, unspecified: Secondary | ICD-10-CM | POA: Diagnosis not present

## 2015-03-20 DIAGNOSIS — I10 Essential (primary) hypertension: Secondary | ICD-10-CM

## 2015-03-20 DIAGNOSIS — F419 Anxiety disorder, unspecified: Secondary | ICD-10-CM | POA: Insufficient documentation

## 2015-03-20 DIAGNOSIS — H3530 Unspecified macular degeneration: Secondary | ICD-10-CM | POA: Insufficient documentation

## 2015-03-20 DIAGNOSIS — N186 End stage renal disease: Secondary | ICD-10-CM

## 2015-03-20 DIAGNOSIS — Z79899 Other long term (current) drug therapy: Secondary | ICD-10-CM | POA: Insufficient documentation

## 2015-03-20 DIAGNOSIS — Z992 Dependence on renal dialysis: Secondary | ICD-10-CM | POA: Diagnosis not present

## 2015-03-20 DIAGNOSIS — F409 Phobic anxiety disorder, unspecified: Secondary | ICD-10-CM

## 2015-03-20 DIAGNOSIS — E669 Obesity, unspecified: Secondary | ICD-10-CM | POA: Insufficient documentation

## 2015-03-20 DIAGNOSIS — F329 Major depressive disorder, single episode, unspecified: Secondary | ICD-10-CM | POA: Diagnosis not present

## 2015-03-20 DIAGNOSIS — Z23 Encounter for immunization: Secondary | ICD-10-CM | POA: Diagnosis not present

## 2015-03-20 DIAGNOSIS — Z794 Long term (current) use of insulin: Secondary | ICD-10-CM | POA: Diagnosis not present

## 2015-03-20 DIAGNOSIS — E1029 Type 1 diabetes mellitus with other diabetic kidney complication: Secondary | ICD-10-CM

## 2015-03-20 DIAGNOSIS — Z888 Allergy status to other drugs, medicaments and biological substances status: Secondary | ICD-10-CM | POA: Diagnosis not present

## 2015-03-20 DIAGNOSIS — G4700 Insomnia, unspecified: Secondary | ICD-10-CM | POA: Diagnosis not present

## 2015-03-20 DIAGNOSIS — F5105 Insomnia due to other mental disorder: Secondary | ICD-10-CM

## 2015-03-20 DIAGNOSIS — F32A Depression, unspecified: Secondary | ICD-10-CM | POA: Insufficient documentation

## 2015-03-20 LAB — CBC WITH DIFFERENTIAL/PLATELET
BASOS ABS: 0 10*3/uL (ref 0.0–0.1)
Basophils Relative: 0 % (ref 0–1)
EOS PCT: 4 % (ref 0–5)
Eosinophils Absolute: 0.2 10*3/uL (ref 0.0–0.7)
HEMATOCRIT: 25.6 % — AB (ref 36.0–46.0)
HEMOGLOBIN: 8.4 g/dL — AB (ref 12.0–15.0)
LYMPHS ABS: 0.6 10*3/uL — AB (ref 0.7–4.0)
LYMPHS PCT: 10 % — AB (ref 12–46)
MCH: 29.3 pg (ref 26.0–34.0)
MCHC: 32.8 g/dL (ref 30.0–36.0)
MCV: 89.2 fL (ref 78.0–100.0)
MONO ABS: 0.5 10*3/uL (ref 0.1–1.0)
MPV: 10.2 fL (ref 8.6–12.4)
Monocytes Relative: 9 % (ref 3–12)
NEUTROS ABS: 4.6 10*3/uL (ref 1.7–7.7)
Neutrophils Relative %: 77 % (ref 43–77)
Platelets: 183 10*3/uL (ref 150–400)
RBC: 2.87 MIL/uL — AB (ref 3.87–5.11)
RDW: 16.1 % — ABNORMAL HIGH (ref 11.5–15.5)
WBC: 6 10*3/uL (ref 4.0–10.5)

## 2015-03-20 LAB — COMPLETE METABOLIC PANEL WITH GFR
ALBUMIN: 3.7 g/dL (ref 3.6–5.1)
ALK PHOS: 92 U/L (ref 33–130)
ALT: 9 U/L (ref 6–29)
AST: 12 U/L (ref 10–35)
BILIRUBIN TOTAL: 0.4 mg/dL (ref 0.2–1.2)
BUN: 80 mg/dL — AB (ref 7–25)
CALCIUM: 8.8 mg/dL (ref 8.6–10.4)
CHLORIDE: 98 mmol/L (ref 98–110)
CO2: 24 mmol/L (ref 20–31)
CREATININE: 12.73 mg/dL — AB (ref 0.50–1.05)
GFR, EST AFRICAN AMERICAN: 3 mL/min — AB (ref >=60)
GFR, Est Non African American: 3 mL/min — ABNORMAL LOW (ref >=60)
Glucose, Bld: 120 mg/dL — ABNORMAL HIGH (ref 65–99)
Potassium: 4.7 mmol/L (ref 3.5–5.3)
Sodium: 141 mmol/L (ref 135–146)
Total Protein: 6.2 g/dL (ref 6.1–8.1)

## 2015-03-20 LAB — POCT GLYCOSYLATED HEMOGLOBIN (HGB A1C): Hemoglobin A1C: 8.6

## 2015-03-20 LAB — GLUCOSE, POCT (MANUAL RESULT ENTRY): POC Glucose: 166 mg/dl — AB (ref 70–99)

## 2015-03-20 MED ORDER — ISOSORBIDE MONONITRATE ER 60 MG PO TB24: 60.0000 mg | ORAL_TABLET | Freq: Every day | ORAL | Status: AC

## 2015-03-20 MED ORDER — AMLODIPINE BESYLATE 10 MG PO TABS: 10.0000 mg | ORAL_TABLET | Freq: Every day | ORAL | Status: AC

## 2015-03-20 MED ORDER — DIAZEPAM 5 MG PO TABS: 5.0000 mg | ORAL_TABLET | Freq: Every evening | ORAL | Status: AC | PRN

## 2015-03-20 MED ORDER — GABAPENTIN 100 MG PO CAPS
100.0000 mg | ORAL_CAPSULE | Freq: Three times a day (TID) | ORAL | Status: AC
Start: 2015-03-20 — End: ?

## 2015-03-20 MED ORDER — FLUOXETINE HCL 40 MG PO CAPS: 40.0000 mg | ORAL_CAPSULE | Freq: Every day | ORAL | Status: AC

## 2015-03-20 MED ORDER — HYDRALAZINE HCL 100 MG PO TABS: ORAL_TABLET | ORAL | Status: AC

## 2015-03-20 MED ORDER — INSULIN GLARGINE 100 UNIT/ML SOLOSTAR PEN: 15.0000 [IU] | PEN_INJECTOR | Freq: Every morning | SUBCUTANEOUS | Status: AC

## 2015-03-20 MED FILL — GABAPENTIN 100 MG CAPSULE: 100 | 30 days supply | Qty: 90 | Fill #0

## 2015-03-20 MED FILL — hydrALAZINE HCL 100 MG TABS: 100 | 30 days supply | Qty: 90 | Fill #0

## 2015-03-20 MED FILL — FLUoxetine HCL 40 MG CAPS: 40 | 30 days supply | Qty: 30 | Fill #0

## 2015-03-20 MED FILL — ISOSORBIDE MN ER 60 MG TAB: 60 | 30 days supply | Qty: 30 | Fill #0

## 2015-03-20 NOTE — Progress Notes (Signed)
Patient ID: Joan Young, female   DOB: May 31, 1960, 55 y.o.   MRN: [id number]   Joan Young, is a 55 y.o. female  KKX:[id number]  [id number]  DOB - 08-25-1960  Chief Complaint  Patient presents with  . Follow-up    DM + HTN        Subjective:   Joan Young is a 55 y.o. female with extensive medical history which includes uncontrolled type 1 diabetes mellitus with complications, hypertension, coronary artery disease, end-stage renal disease on peritoneal dialysis here today for a follow up visit and for medication refills. Patient was initially on hemodialysis recently converted to peritoneal dialysis. Patient is doing well at home, she has no new complaints today except that she needs a refill on some of her medications. She follows up regularly with the nephrologist and cardiologist. Patient denies fever, no abdominal pain, no nausea or vomiting, no constipation. She has no bleeding from any orifices. Patient has No headache, No chest pain, No new weakness tingling or numbness, No Cough - SOB. She had a colonoscopy that showed tubulovillous adenoma with high-grade dysplasia status post surgery in September 2016 at [facility]. Patient no longer have melena stool or bleeding. 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 Patient claims she has not been sleeping well sometimes she does not sleep all night. She would like to have some medication to be able to calm her down so she can sleep. Her insomnia most likely due to anxiety. She denies any suicidal ideation or thoughts.  Problem  Esrd On Dialysis ([facility])  Depression (Emotion)    ALLERGIES: Allergies  Allergen Reactions  . Hydromorphone Itching and Swelling  . Tape Rash    PAST MEDICAL HISTORY: Past Medical  History  Diagnosis Date  . Obesity   . Macular degeneration disease   . Depression   . High cholesterol Dx 2005  . Hypertension Dx 7510  . Diabetes mellitus without complication ([place]) DX 2585    type 77 since 55 years old but reports starting insulin at 55 years old  . Renal disorder   . Dialysis patient [facility])     MEDICATIONS AT HOME: Prior to Admission medications   Medication Sig Start Date End Date Taking? Authorizing Provider  albuterol (PROVENTIL HFA;VENTOLIN HFA) 108 (90 BASE) MCG/ACT inhaler Inhale 2 puffs into the lungs every 6 (six) hours as needed for shortness of breath. And cough 07/08/14  Yes Lance Bosch, NP  amLODipine (NORVASC) 10 MG tablet Take 1 tablet (10 mg total) by mouth daily. 03/20/15  Yes Tresa Garter, MD  aspirin EC 81 MG tablet Take 81 mg by mouth daily.   Yes Historical Provider, MD  Blood Glucose Monitoring Suppl (TRUE METRIX METER) w/Device KIT Use as directed 03/04/15  Yes Terrianne Cavness E Doreene Burke, MD  calcium carbonate (OS-CAL - DOSED IN MG OF ELEMENTAL CALCIUM) 1250 (500 CA) MG tablet Take 1 tablet (500 mg of elemental calcium total) by mouth 3 (three) times daily with meals. 10/10/14  Yes Barton Dubois, MD  carvedilol (COREG) 25 MG tablet TAKE 1 TABLET BY MOUTH 2 TIMES DAILY WITH A MEAL. 03/05/15  Yes Tresa Garter, MD  FLUoxetine (PROZAC) 40 MG capsule Take 1 capsule (40 mg total) by mouth daily. 03/20/15  Yes Tresa Garter, MD  gabapentin (NEURONTIN)  100 MG capsule Take 1 capsule (100 mg total) by mouth 3 (three) times daily. 03/20/15  Yes Quentin Angst, MD  glucose blood (TRUE METRIX BLOOD GLUCOSE TEST) test strip Use as instructed 02/26/15  Yes Mutasim Tuckey E Hyman Hopes, MD  guaiFENesin (MUCINEX) 600 MG 12 hr tablet Take 1 tablet (600 mg total) by mouth 2 (two) times daily. 10/10/14  Yes Vassie Loll, MD  hydrALAZINE (APRESOLINE) 100 MG tablet TAKE 1 TABLET BY MOUTH 3 TIMES DAILY. 03/20/15  Yes Quentin Angst, MD  hydrOXYzine (ATARAX/VISTARIL)  25 MG tablet Take 25 mg by mouth. 12/03/14  Yes Historical Provider, MD  Insulin Glargine (LANTUS SOLOSTAR) 100 UNIT/ML Solostar Pen Inject 15 Units into the skin every morning. 03/20/15  Yes Quentin Angst, MD  isosorbide mononitrate (IMDUR) 60 MG 24 hr tablet Take 1 tablet (60 mg total) by mouth daily. 03/20/15  Yes Quentin Angst, MD  losartan (COZAAR) 50 MG tablet Take 1 tablet (50 mg total) by mouth daily. 03/05/15  Yes Quentin Angst, MD  nitroGLYCERIN (NITROSTAT) 0.4 MG SL tablet Place 1 tablet (0.4 mg total) under the tongue every 5 (five) minutes as needed for chest pain. 09/10/14  Yes Rhetta Mura, MD  NOVOLIN R 100 UNIT/ML injection INJECT 16 UNITS EVERY MORNING INTO THE SKIN, 16 UNITS AT LUNCH AND INJECT 10 UNITS AT BEDTIME PER SLIDING SCALE 02/26/15  Yes Quentin Angst, MD  Nutritional Supplements (FEEDING SUPPLEMENT, NEPRO CARB STEADY,) LIQD Take 237 mLs by mouth 2 (two) times daily between meals. 10/10/14  Yes Vassie Loll, MD  omeprazole (PRILOSEC) 20 MG capsule TAKE 1 CAPSULE BY MOUTH DAILY 02/04/15  Yes Ambrose Finland, NP  ondansetron (ZOFRAN ODT) 8 MG disintegrating tablet Take 1 tablet (8 mg total) by mouth every 8 (eight) hours as needed for nausea or vomiting. 10/10/14  Yes Vassie Loll, MD  pravastatin (PRAVACHOL) 40 MG tablet Take 40 mg by mouth daily.   Yes Historical Provider, MD  saccharomyces boulardii (FLORASTOR) 250 MG capsule Take 1 capsule (250 mg total) by mouth 2 (two) times daily. 10/10/14  Yes Vassie Loll, MD  sevelamer carbonate (RENVELA) 800 MG tablet Take 800 mg by mouth 3 (three) times daily with meals.   Yes Historical Provider, MD  timolol (BETIMOL) 0.5 % ophthalmic solution Place 1 drop into the right eye daily.   Yes Historical Provider, MD  acetaminophen-codeine (TYLENOL #3) 300-30 MG per tablet Take 1 tablet by mouth every 4 (four) hours as needed. Patient not taking: Reported on 03/20/2015 11/11/14   Quentin Angst, MD  diazepam  (VALIUM) 5 MG tablet Take 1 tablet (5 mg total) by mouth at bedtime as needed for anxiety. 03/20/15   Quentin Angst, MD  hydrocortisone 2.5 % lotion Apply topically 2 (two) times daily. Patient not taking: Reported on 03/20/2015 12/03/14   Eyvonne Mechanic, PA-C     Objective:   Filed Vitals:   03/20/15 1243  BP: 193/93  Pulse: 85  Temp: 98.4 F (36.9 C)  TempSrc: Oral  Resp: 18  Height: 5\' 1"  (1.549 m)  Weight: 172 lb 12.8 oz (78.382 kg)  SpO2: 100%    Exam General appearance : Awake, alert, not in any distress. Speech Clear. Not toxic looking HEENT: Atraumatic and Normocephalic, pupils equally reactive to light and accomodation Neck: supple, no JVD. No cervical lymphadenopathy.  Chest:Good air entry bilaterally, no added sounds  CVS: S1 S2 regular, no murmurs.  Abdomen: Peritoneal dialysis portal and tube in situ, no  evidence of infection. Bowel sounds present, Non tender and not distended with no gaurding, rigidity or rebound. Extremities: B/L Lower Ext shows no edema, both legs are warm to touch Neurology: Awake alert, and oriented X 3, CN II-XII intact, Non focal  Data Review Lab Results  Component Value Date   HGBA1C 8.60 03/20/2015   HGBA1C 8.9* 10/07/2014   HGBA1C 9.4* 09/09/2014     Assessment & Plan   1. Uncontrolled type 1 diabetes mellitus with other diabetic kidney complication ([place])  - POCT A1C: She has chronic anemia which will give falsely low hemoglobin A1c, Her A1C is 8.6% down from 8.9% in August 2016.  - Glucose (CBG) - Insulin Glargine (LANTUS SOLOSTAR) 100 UNIT/ML Solostar Pen; Inject 15 Units into the skin every morning.  Dispense: 5 pen; Refill: 3 - gabapentin (NEURONTIN) 100 MG capsule; Take 1 capsule (100 mg total) by mouth 3 (three) times daily.  Dispense: 180 capsule; Refill: 3 - CBC with Differential/Platelet - COMPLETE METABOLIC PANEL WITH GFR  Aim for 30 minutes of exercise most days. Rethink what you drink. Water is great! Aim for  2-3 Carb Choices per meal (30-45 grams) +/- 1 either way  Aim for 0-15 Carbs per snack if hungry  Include protein in moderation with your meals and snacks  Consider reading food labels for Total Carbohydrate and Fat Grams of foods  Consider checking BG at alternate times per day  Continue taking medication as directed Be mindful about how much sugar you are adding to beverages and other foods. Fruit Punch - find one with no sugar  Measure and decrease portions of carbohydrate foods  Make your plate and don't go back for seconds  2. Essential hypertension: uncontrolled  - hydrALAZINE (APRESOLINE) 100 MG tablet; TAKE 1 TABLET BY MOUTH 3 TIMES DAILY.  Dispense: 90 tablet; Refill: 3 - amLODipine (NORVASC) 10 MG tablet; Take 1 tablet (10 mg total) by mouth daily.  Dispense: 90 tablet; Refill: 3 - isosorbide mononitrate (IMDUR) 60 MG 24 hr tablet; Take 1 tablet (60 mg total) by mouth daily.  Dispense: 90 tablet; Refill: 3  We have discussed target BP range and blood pressure goal. I have advised patient to check BP regularly and to call us back or report to clinic if the numbers are consistently higher than 140/90. We discussed the importance of compliance with medical therapy and DASH diet recommended, consequences of uncontrolled hypertension discussed.   - continue current BP medications  3. Depression (emotion)  - FLUoxetine (PROZAC) 40 MG capsule; Take 1 capsule (40 mg total) by mouth daily.  Dispense: 90 capsule; Refill: 3  4. ESRD on dialysis [facility])  Continue Peritoneal Dialysis  Follow up with nephrologist  5. Insomnia due to anxiety and fear  Trial of Diazepam - diazepam (VALIUM) 5 MG tablet; Take 1 tablet (5 mg total) by mouth at bedtime as needed for anxiety.  Dispense: 30 tablet; Refill: 3 - Behavioral modification emphasized  Patient have been counseled extensively about nutrition and exercise   Interpreter was used to communicate directly with patient for the entire  encounter including providing detailed patient instructions.   Return in about 4 weeks (around 04/17/2015) for Follow up HTN, CPP Stacey for BP/CBG and DM management.  The patient was given clear instructions to go to ER or return to medical center if symptoms don't improve, worsen or new problems develop. The patient verbalized understanding. The patient was told to call to get lab results if they haven't heard anything in  the next week.   This note has been created with Surveyor, quantity. Any transcriptional errors are unintentional.    Angelica Chessman, MD, [place], [place], [place], [place] and [place], [place]   03/20/2015, 1:13 PM

## 2015-03-20 NOTE — Progress Notes (Signed)
Patient here for FU DM + HTN  Patient denies pain at this time.  Patient would like her flu shot today.  Patient tolerated flu shot well.  Patient needs refills on Hydralazine and Lantus Pen.

## 2015-03-20 NOTE — Patient Instructions (Addendum)
Diabetes y actividad fsica (Diabetes and Exercise) Hacer actividad fsica con regularidad es muy importante. No se trata solo de perder peso. Tiene muchos otros beneficios, como por ejemplo:  Mejorar el estado fsico, la flexibilidad y la resistencia.  Aumenta la densidad sea.  Ayuda a controlar el peso.  Disminuye la grasa corporal.  Aumenta la fuerza muscular.  Reduce el estrs y las tensiones.  Mejora el estado de salud general. Las personas diabticas que realizan actividad fsica tienen beneficios adicionales debido al ejercicio:  Reduce el apetito.  El organismo mejora el uso del azcar (glucosa) de la sangre.  Ayuda a disminuir o controlar la glucosa en la sangre.  Disminuye la presin arterial.  Ayuda a disminuir los lpidos en la sangre (colesterol y triglicridos).  El organismo mejora el uso de la insulina porque:  Aumenta la sensibilidad del organismo a la insulina.  Reduce las necesidades de insulina del organismo.  Disminuye el riesgo de enfermedad cardaca por la actividad fsica ya que  disminuye el colesterol y tambin los triglicridos.  Aumenta los niveles de colesterol bueno (como las lipoprotenas de alta densidad [HDL]) en el organismo.  Disminuye los niveles de glucosa en la sangre. SU PLAN DE ACTIVIDAD  Elija una actividad que disfrute y establezca objetivos realistas. Para ejercitarse sin riesgos, debe comenzar a practicar cualquier actividad fsica nueva lentamente y aumentar la intensidad del ejercicio de forma gradual con el tiempo. Su mdico o educador en diabetes podrn ayudarlo a crear un plan de actividades que lo beneficie. Las recomendaciones generales incluyen lo siguiente:  Alentar a los nios para que realicen al menos 60 minutos de actividad fsica cada da.  Estirarse y realizar ejercicios de entrenamiento de la fuerza, como yoga o levantamiento de pesas, por lo menos 2 veces por semana.  Realizar en total por lo menos 150  minutos de ejercicios de intensidad moderada cada semana, como caminar a paso ligero o hacer gimnasia acutica.  Hacer ejercicio fsico por lo menos 3 das por semana y no dejar pasar ms de 2 das seguidos sin ejercitarse.  Evitar los perodos largos de inactividad (90 minutos o ms tiempo). Cuando deba pasar mucho tiempo sentado, haga pausas frecuentes para caminar o estirarse. RECOMENDACIONES PARA REALIZAR EJERCICIOS CUANDO SE TIENE DIABETES TIPO 1 O TIPO 2   Controle la glucosa en la sangre antes de comenzar. Si el nivel de glucosa en la sangre es de ms de 240 mg/dl, controle las cetonas en la orina. No haga actividad fsica si hay cetonas.  Evite inyectarse insulina en las zonas del cuerpo que ejercitar. Por ejemplo, evite inyectarse insulina en:  Los brazos, si juega al tenis.  Las piernas, si corre.  Lleve un registro de:  Los alimentos que consume antes y despus de hacer el ejercicio.  Los momentos esperables de picos de accin de la insulina.  Los niveles de glucosa en la sangre antes y despus de hacer ejercicios.  El tipo y cantidad de actividad fsica que realiza.  Revise los registros con su mdico. El mdico lo ayudar a desarrollar pautas para ajustar la cantidad de alimento y las cantidades de insulina antes y despus de hacer ejercicios.  Si toma insulina o agentes hipoglucemiantes por va oral, observe si hay signos y sntomas de hipoglucemia. Entre los que se incluyen:  Mareos.  Temblores.  Sudoracin.  Escalofros.  Confusin.  Beba gran cantidad de agua mientras hace ejercicios para evitar la deshidratacin o los golpes de calor. Durante la actividad fsica se pierde   agua corporal que se debe reponer.  Comente con su mdico antes de comenzar un programa de actividad fsica para verificar que sea seguro para usted. Recuerde, cualquier actividad es mejor que ninguna.   Esta informacin no tiene como fin reemplazar el consejo del mdico. Asegrese de  hacerle al mdico cualquier pregunta que tenga.   Document Released: 03/07/2007 Document Revised: 07/02/2014 Elsevier Interactive Patient Education 2016 Elsevier Inc. Recuento bsico de carbohidratos para la diabetes mellitus (Basic Carbohydrate Counting for Diabetes Mellitus) El recuento de carbohidratos es un mtodo destinado a calcular la cantidad de carbohidratos en la dieta. El consumo de carbohidratos aumenta naturalmente el nivel de azcar (glucosa) en la sangre, por lo que es importante que sepa la cantidad que debe incluir en cada comida. El recuento de carbohidratos ayuda a mantener el nivel de glucosa en la sangre dentro de los lmites normales. La cantidad permitida de carbohidratos es diferente para cada persona. Un nutricionista puede ayudarlo a calcular la cantidad adecuada para usted. Una vez que sepa la cantidad de carbohidratos que puede consumir, podr calcular los carbohidratos de los alimentos que desea comer. Los siguientes alimentos incluyen carbohidratos:  Granos, como panes y cereales.  Frijoles secos y productos con soja.  Vegetales almidonados, como papas, guisantes y maz.  Frutas y jugos de frutas.  Leche y yogur.  Dulces y bocadillos, como pastel, galletas, caramelos, papas fritas de bolsa, refrescos y bebidas frutales con azcar. RECUENTO DE CARBOHIDRATOS Hay dos maneras de calcular los carbohidratos de los alimentos. Puede usar cualquiera de los dos mtodos o una combinacin de ambos. Leer la etiqueta de informacin nutricional de los alimentos envasados La informacin nutricional es una etiqueta incluida en casi todas las bebidas y los alimentos envasados de los [place]. Indica el tamao de la porcin de ese alimento o bebida e informacin sobre los nutrientes de cada porcin, incluso los gramos (g) de carbohidratos por porcin.  Decida la cantidad de porciones que comer o tomar de este alimento o bebida. Multiplique la cantidad de porciones por el  nmero de gramos de carbohidratos indicados en la etiqueta para esa porcin. El total ser la cantidad de carbohidratos que consumir al comer ese alimento o tomar esa bebida. Conocer las porciones estndar de los alimentos Cuando coma alimentos no envasados o que no incluyan la informacin nutricional en la etiqueta, deber medir las porciones para poder calcular la cantidad de carbohidratos. Una porcin de la mayora de los alimentos ricos en carbohidratos contiene alrededor de 15g de carbohidratos. La siguiente lista incluye los tamaos de porcin de los alimentos ricos en carbohidratos que contienen alrededor de 15g de carbohidratos por porcin:   1rebanada de pan (1oz) o 1tortilla de seis pulgadas.  panecillo de hamburguesa o bollito tipo ingls.  4a 6galletas.   de taza de cereal sin azcar y seco.   taza de cereal caliente.   de taza de arroz o pastas.  taza de pur de papas o de una papa grande al horno.  1taza de frutas frescas o una fruta pequea.  taza de frutas o jugo de frutas enlatados o congelados.  1 taza de leche.   de taza de yogur descremado sin ningn agregado o de yogur endulzado con edulcorante artificial.  taza de vegetales almidonados, como guisantes, maz o papas, o de frijoles secos cocidos. Decida la cantidad de porciones estndar que comer. Multiplique la cantidad de porciones por 15 (los gramos de carbohidratos en esa porcin). Por ejemplo, si come 2tazas de fresas, habr comido   2porciones y 30g de carbohidratos (2porciones x 15g = 30g). Para las comidas como sopas y guisos, en las que se mezcla ms de un alimento, deber contar los carbohidratos de cada alimento incluido. EJEMPLO DE RECUENTO DE CARBOHIDRATOS Ejemplo de cena  3 onzas de pechugas de pollo.   de taza de arroz integral.   taza de maz.  1 taza de leche.  1 taza de fresas con crema batida sin azcar. Clculo de carbohidratos [place] 1: Identifique los  alimentos que contienen carbohidratos:   Arroz.  Maz.  Leche.  Fresas. Paso 2: Calcule el nmero de porciones que consumir de cada uno:   2 porciones de arroz.  1 porcin de maz.  1 porcin de leche.  1 porcin de fresas. Paso 3: Multiplique cada una de esas porciones por 15g:   2 porciones de arroz x 15 g = 30 g.  1 porcin de maz x 15 g = 15 g.  1 porcin de leche x 15 g = 15 g.  1 porcin de fresas x 15 g = 15 g. Paso 4: Sume todas las cantidades para conocer el total de gramos de carbohidratos consumidos: 30 g + 15 g + 15 g + 15 g = 75 g.   Esta informacin no tiene como fin reemplazar el consejo del mdico. Asegrese de hacerle al mdico cualquier pregunta que tenga.   Document Released: 05/10/2011 Document Revised: 03/08/2014 Elsevier Interactive Patient Education 2016 Elsevier Inc. Plan de alimentacin DASH (DASH Eating Plan) DASH es la sigla en ingls de "Enfoques Alimentarios para Detener la Hipertensin". El plan de alimentacin DASH ha demostrado bajar la presin arterial elevada (hipertensin). Los beneficios adicionales para la salud pueden incluir la disminucin del riesgo de diabetes mellitus tipo2, enfermedades cardacas e ictus. Este plan tambin puede ayudar a adelgazar. QU DEBO SABER ACERCA DEL PLAN DE ALIMENTACIN DASH? Para el plan de alimentacin DASH, seguir las siguientes pautas generales:  Elija los alimentos con un valor porcentual diario de sodio de menos del 5% (segn figura en la etiqueta del alimento).  Use hierbas o aderezos sin sal, en lugar de sal de mesa o sal marina.  Consulte al mdico o farmacutico antes de usar sustitutos de la sal.  Coma productos con bajo contenido de sodio, cuya etiqueta suele decir "bajo contenido de sodio" o "sin agregado de sal".  Coma alimentos frescos.  Coma ms verduras, frutas y productos lcteos con bajo contenido de grasas.  Elija los cereales integrales. Busque la palabra "integral" en  el primer lugar de la lista de ingredientes.  Elija el pescado y el pollo o el pavo sin piel ms a menudo que las carnes rojas. Limite el consumo de pescado, carne de ave y carne a 6onzas (170g) por da.  Limite el consumo de dulces, postres, azcares y bebidas azucaradas.  Elija las grasas saludables para el corazn.  Limite el consumo de queso a 1onza (28g) por da.  Consuma ms comida casera y menos de restaurante, de buf y comida rpida.  Limite el consumo de alimentos fritos.  Cocine los alimentos utilizando mtodos que no sean la fritura.  Limite las verduras enlatadas. Si las consume, enjuguelas bien para disminuir el sodio.  Cuando coma en un restaurante, pida que preparen su comida con menos sal o, en lo posible, sin nada de sal. QU ALIMENTOS PUEDO COMER? Pida ayuda a un nutricionista para conocer las necesidades calricas individuales. Cereales Pan de salvado o integral. Arroz integral. Pastas de salvado o integrales. Quinua,   trigo burgol y cereales integrales. Cereales con bajo contenido de sodio. Tortillas de harina de maz o de salvado. Pan de maz integral. Galletas saladas integrales. Galletas con bajo contenido de sodio. Vegetales Verduras frescas o congeladas (crudas, al vapor, asadas o grilladas). Jugos de tomate y verduras con contenido bajo o reducido de sodio. Pasta y salsa de tomate con contenido bajo o reducido de sodio. Verduras enlatadas con bajo contenido de sodio o reducido de sodio.  Frutas Frutas frescas, en conserva (en su jugo natural) o frutas congeladas. Carnes y otros productos con protenas Carne de res molida (al 85% o ms magra), carne de res de animales alimentados con pastos o carne de res sin la grasa. Pollo o pavo sin piel. Carne de pollo o de pavo molida. Cerdo sin la grasa. Todos los pescados y frutos de mar. Huevos. Porotos, guisantes o lentejas secos. Frutos secos y semillas sin sal. Frijoles enlatados sin sal. Lcteos Productos  lcteos con bajo contenido de grasas, como leche descremada o al 1%, quesos reducidos en grasas o al 2%, ricota con bajo contenido de grasas o queso cottage, o yogur natural con bajo contenido de grasas. Quesos con contenido bajo o reducido de sodio. Grasas y aceites Margarinas en barra que no contengan grasas trans. Mayonesa y alios para ensaladas livianos o reducidos en grasas (reducidos en sodio). [place]. Aceites de crtamo, oliva o canola. Mantequilla natural de man o almendra. Otros Palomitas de maz y pretzels sin sal. Los artculos mencionados arriba pueden no ser una lista completa de las bebidas o los alimentos recomendados. Comunquese con el nutricionista para conocer ms opciones. QU ALIMENTOS NO SE RECOMIENDAN? Cereales Pan blanco. Pastas blancas. Arroz blanco. Pan de maz refinado. Bagels y croissants. Galletas saladas que contengan grasas trans. Vegetales Vegetales con crema o fritos. Verduras en salsa de queso. Verduras enlatadas comunes. Pasta y salsa de tomate en lata comunes. Jugos comunes de tomate y de verduras. Frutas Frutas secas. Fruta enlatada en almbar liviano o espeso. Jugo de frutas. Carnes y otros productos con protenas Cortes de carne con grasa. Costillas, alas de pollo, tocineta, salchicha, mortadela, salame, chinchulines, tocino, perros calientes, salchichas alemanas y embutidos envasados. Frutos secos y semillas con sal. Frijoles con sal en lata. Lcteos Leche entera o al 2%, crema, mezcla de leche y crema, y queso crema. Yogur entero o endulzado. Quesos o queso azul con alto contenido de grasas. Cremas no lcteas y coberturas batidas. Quesos procesados, quesos para untar o cuajadas. Condimentos Sal de cebolla y ajo, sal condimentada, sal de mesa y sal marina. Salsas en lata y envasadas. Salsa [place]. Salsa trtara. Salsa barbacoa. Salsa teriyaki. Salsa de soja, incluso la que tiene contenido reducido de sodio. Salsa de carne. Salsa de pescado.  Salsa de ostras. Salsa rosada. Rbano picante. Ketchup y mostaza. Saborizantes y tiernizantes para carne. Caldo en cubitos. Salsa picante. Salsa tabasco. Adobos. Aderezos para tacos. Salsas. Grasas y aceites Mantequilla, margarina en barra, manteca de cerdo, grasa, mantequilla clarificada y grasa de tocino. Aceites de coco, de palmiste o de palma. Aderezos comunes para ensalada. Otros Pickles y aceitunas. Palomitas de maz y pretzels con sal. Los artculos mencionados arriba pueden no ser una lista completa de las bebidas y los alimentos que se deben evitar. Comunquese con el nutricionista para obtener ms informacin. DNDE PUEDO ENCONTRAR MS INFORMACIN? Instituto Nacional del Corazn, del Pulmn y de la Sangre (National Heart, Lung, and Blood Institute): www.nhlbi.nih.gov/health/health-topics/topics/dash/   Esta informacin no tiene como fin reemplazar el consejo del mdico. Asegrese   de hacerle al mdico cualquier pregunta que tenga.   Document Released: 02/04/2011 Document Revised: 03/08/2014 Elsevier Interactive Patient Education 2016 Elsevier Inc. Hipertensin (Hypertension) La hipertensin, conocida comnmente como presin arterial alta, se produce cuando la sangre bombea en las arterias con mucha fuerza. Las arterias son los vasos sanguneos que transportan la sangre desde el corazn hacia todas las partes del cuerpo. Una lectura de la presin arterial consiste en un nmero ms alto sobre un nmero ms bajo, por ejemplo, 110/72. El nmero ms alto (presin sistlica) corresponde a la presin interna de las arterias cuando el corazn bombea sangre. El nmero ms bajo (presin diastlica) corresponde a la presin interna de las arterias cuando el corazn se relaja. En condiciones ideales, la presin arterial debe ser inferior a 120/80. La hipertensin fuerza al corazn a trabajar ms para bombear la sangre. Las arterias pueden estrecharse o ponerse rgidas. La hipertensin no tratada o no  controlada puede causar infarto de miocardio, ictus, enfermedad renal y otros problemas. FACTORES DE RIESGO Algunos factores de riesgo de hipertensin son controlables, pero otros no lo son.  Entre los factores de riesgo que usted no puede controlar, se incluyen los siguientes:   La raza. El riesgo es mayor para las personas afroamericanas.  La edad. Los riesgos aumentan con la edad.  El sexo. Antes de los 45aos, los hombres corren ms riesgo que las mujeres. Despus de los 65aos, las mujeres corren ms riesgo que los hombres. Entre los factores de riesgo que usted puede controlar, se incluyen los siguientes:  No hacer la cantidad suficiente de actividad fsica o ejercicio.  Tener sobrepeso.  Consumir mucha grasa, azcar, caloras o sal en la dieta.  Beber alcohol en exceso. SIGNOS Y SNTOMAS Por lo general, la hipertensin no causa signos o sntomas. La hipertensin arterial demasiado alta (crisis hipertensiva) puede causar dolor de cabeza, ansiedad, falta de aire y hemorragia nasal. DIAGNSTICO Para detectar si usted tiene hipertensin, el mdico le medir la presin arterial mientras est sentado, con el brazo levantado a la altura del corazn. Debe medirla al menos dos veces en el mismo brazo. Determinadas condiciones pueden causar una diferencia de presin arterial entre el brazo izquierdo y el derecho. El hecho de tener una sola lectura de la presin arterial ms alta que lo normal no significa que necesita un tratamiento. Si no est claro si tiene hipertensin arterial, es posible que se le pida que regrese otro da para volver a controlarle la presin arterial. O bien se le puede pedir que se controle la presin arterial en su casa durante 1 o ms meses. TRATAMIENTO El tratamiento de la hipertensin arterial incluye hacer cambios en el estilo de vida y, posiblemente, tomar medicamentos. Un estilo de vida saludable puede ayudar a bajar la presin arterial alta. Quiz deba cambiar  algunos hbitos. Los cambios en el estilo de vida pueden incluir lo siguiente:  Seguir la dieta DASH. Esta dieta tiene un alto contenido de frutas, verduras y cereales integrales. Incluye poca cantidad de sal, carnes rojas y azcares agregados.  Mantenga el consumo de sodio por debajo de 2300 mg por da.  Realizar al menos entre 30 y 45 minutos de ejercicio aerbico, 4 veces por semana como mnimo.  Perder peso, si es necesario.  No fumar.  Limitar el consumo de bebidas alcohlicas.  Aprender formas de reducir el estrs. El mdico puede recetarle medicamentos si los cambios en el estilo de vida no son suficientes para lograr controlar la presin arterial y si una de   las siguientes afirmaciones es verdadera:  Tiene entre 18 y 59 aos y su presin arterial sistlica est por encima de 140.  Tiene 60 aos o ms y su presin arterial sistlica est por encima de 150.  Su presin arterial diastlica est por encima de 90.  Tiene diabetes y su presin arterial sistlica est por encima de 140 o su presin arterial diastlica est por encima de 90.  Tiene una enfermedad renal y su presin arterial est por encima de 140/90.  Tiene una enfermedad cardaca y su presin arterial est por encima de 140/90. La presin arterial deseada puede variar en funcin de las enfermedades, la edad y otros factores personales. INSTRUCCIONES PARA EL CUIDADO EN EL HOGAR  Haga que le midan de nuevo la presin arterial segn las indicaciones del mdico.  Tome los medicamentos solamente como se lo haya indicado el mdico. Siga cuidadosamente las indicaciones. Los medicamentos para la presin arterial deben tomarse segn las indicaciones. Los medicamentos pierden eficacia al omitir las dosis. El hecho de omitir las dosis tambin aumenta el riesgo de otros problemas.  No fume.  Contrlese la presin arterial en su casa segn las indicaciones del mdico. SOLICITE ATENCIN MDICA SI:   Piensa que tiene una  reaccin alrgica a los medicamentos.  Tiene mareos o dolores de cabeza con recurrencia.  Tiene hinchazn en los tobillos.  Tiene problemas de visin. SOLICITE ATENCIN MDICA DE INMEDIATO SI:  Siente un dolor de cabeza intenso o confusin.  Siente debilidad inusual, adormecimiento o que se desmayar.  Siente dolor intenso en el pecho o en el abdomen.  Vomita repetidas veces.  Tiene dificultad para respirar. ASEGRESE DE QUE:   Comprende estas instrucciones.  Controlar su afeccin.  Recibir ayuda de inmediato si no mejora o si empeora.   Esta informacin no tiene como fin reemplazar el consejo del mdico. Asegrese de hacerle al mdico cualquier pregunta que tenga.   Document Released: 02/15/2005 Document Revised: 07/02/2014 Elsevier Interactive Patient Education 2016 Elsevier Inc.  

## 2015-03-21 ENCOUNTER — Telehealth: Payer: Self-pay

## 2015-03-21 MED FILL — LANTUS SOLOSTAR 100 UNITS/M: 100 | 40 days supply | Qty: 6 | Fill #0

## 2015-03-21 NOTE — Telephone Encounter (Signed)
Spoke with allison at [facility] Patient creatine is at 12.73 Information given to Dr Venetia Night Patient is on dialysis

## 2015-03-24 MED FILL — TRUE METRIX TEST STRIP: 25 days supply | Qty: 100 | Fill #1 | Status: TO

## 2015-03-24 MED FILL — TRUEPLUS SYR 1ML 30GX5/16: 30G X 5/16" | 50 days supply | Qty: 100 | Fill #2

## 2015-03-25 ENCOUNTER — Telehealth: Payer: Self-pay | Admitting: *Deleted

## 2015-03-25 MED FILL — PRAVASTATIN NA 40 MG TAB: 40 | 30 days supply | Qty: 30 | Fill #0

## 2015-03-25 NOTE — Telephone Encounter (Signed)
-----   Message from Quentin Angst, MD sent at 03/24/2015  6:58 PM EST ----- Please advise patient to continue dialysis and follow with nephrologist, lab results shows stable hemoglobin, continue current medications.

## 2015-03-25 NOTE — Telephone Encounter (Signed)
Patient called returning nurse's phone call to review results. Please f/u °

## 2015-03-25 NOTE — Telephone Encounter (Signed)
Medical Assistant used [employer] to contact patient.  Interpreter Name:Annaball Interpreter #: [phone]  Medical Assistant left message on patient's home and cell voicemail. Voicemail states to give a call back to [place] with [facility] at [phone].

## 2015-03-25 NOTE — Telephone Encounter (Signed)
Medical Assistant used [employer] to contact patient.  Interpreter Name: Sullivan Lone Interpreter #: [id number]  Patient verified DOB Patient advised to continue dialysis and follow up with nephrologist. Patient informed of hemoglobin being stable and to continue current medications.  Patient expressed her understanding and had no further questions.

## 2015-04-02 ENCOUNTER — Other Ambulatory Visit: Payer: Self-pay | Admitting: Internal Medicine

## 2015-04-02 MED FILL — LOSARTAN POTASSIUM 50 MG TA: 50 | 30 days supply | Qty: 30 | Fill #1

## 2015-04-02 MED FILL — ?CARVEDILOL 25 MG TABLET: 25 | 30 days supply | Qty: 60 | Fill #0

## 2015-04-02 MED FILL — ?AMLODIPINE BESYLATE 10 MG: 10 | 30 days supply | Qty: 30 | Fill #2

## 2015-04-02 MED FILL — ?OMEPRAZOLE DR 20 MG CAPSUL: 20 | 30 days supply | Qty: 30 | Fill #1

## 2015-04-02 MED FILL — raNITIdine HCL 150 MG TABS: 150 | 30 days supply | Qty: 30 | Fill #5

## 2015-04-17 ENCOUNTER — Encounter: Payer: BLUE CROSS/BLUE SHIELD | Admitting: Pharmacist

## 2015-04-22 MED FILL — PRAVASTATIN NA 40 MG TAB: 40 | 30 days supply | Qty: 30 | Fill #1

## 2015-04-22 MED FILL — FLUoxetine HCL 40 MG CAPS: 40 | 30 days supply | Qty: 30 | Fill #1

## 2015-04-24 ENCOUNTER — Ambulatory Visit: Payer: BLUE CROSS/BLUE SHIELD | Attending: Family Medicine | Admitting: Pharmacist

## 2015-04-24 ENCOUNTER — Encounter: Payer: Self-pay | Admitting: Pharmacist

## 2015-04-24 VITALS — BP 158/78 | HR 92

## 2015-04-24 DIAGNOSIS — E1065 Type 1 diabetes mellitus with hyperglycemia: Secondary | ICD-10-CM | POA: Diagnosis not present

## 2015-04-24 DIAGNOSIS — E1029 Type 1 diabetes mellitus with other diabetic kidney complication: Secondary | ICD-10-CM | POA: Diagnosis not present

## 2015-04-24 DIAGNOSIS — IMO0002 Reserved for concepts with insufficient information to code with codable children: Secondary | ICD-10-CM

## 2015-04-24 NOTE — Patient Instructions (Signed)
Thank you for coming to see me!  Follow up with Dr. Hyman Hopes if you need anything before you leave.  Try checking your blood sugars again - I am hopeful that your meter will work. If it does and you get some readings - come back and see me and I can adjust your insulin if needed  Recuento bsico de carbohidratos para la diabetes mellitus (Basic Carbohydrate Counting for Diabetes Mellitus) El recuento de carbohidratos es un mtodo destinado a calcular la cantidad de carbohidratos en la dieta. El consumo de carbohidratos aumenta naturalmente el nivel de azcar (glucosa) en la sangre, por lo que es importante que sepa la cantidad que debe incluir en cada comida. El recuento de carbohidratos ayuda a Futures trader de glucosa en la sangre dentro de los lmites normales. La cantidad permitida de carbohidratos es diferente para cada persona. Un nutricionista puede ayudarlo a calcular la cantidad adecuada para usted. Una vez que sepa la cantidad de carbohidratos que puede consumir, podr calcular los carbohidratos de los alimentos que desea comer. Los siguientes alimentos incluyen carbohidratos:  Granos, como panes y cereales.  Frijoles secos y productos con soja.  Vegetales almidonados, como papas, guisantes y maz.  Nils Pyle y jugos de frutas.  Leche y Dentist.  Dulces y bocadillos, como pastel, galletas, caramelos, papas fritas de bolsa, refrescos y bebidas frutales con azcar. RECUENTO DE CARBOHIDRATOS [employer] de calcular los carbohidratos de los alimentos. Puede usar cualquiera de [address] o [place] combinacin de [place]. Leer la etiqueta de informacin nutricional de los alimentos envasados La informacin nutricional es una etiqueta incluida en casi todas las bebidas y los alimentos envasados de los [place]. Indica el tamao de la porcin de ese alimento o bebida e informacin sobre los nutrientes de cada porcin, incluso los gramos (g) de carbohidratos por porcin.  Decida la  cantidad de porciones que comer o tomar de este alimento o bebida. Multiplique la cantidad de porciones por el nmero de gramos de carbohidratos indicados en la etiqueta para esa porcin. El total ser la cantidad de carbohidratos que consumir al comer ese alimento o tomar esa bebida. Conocer las porciones estndar de los alimentos Cuando coma alimentos no envasados o que no incluyan la informacin nutricional en la etiqueta, deber medir las porciones para poder calcular la cantidad de carbohidratos. Una porcin de la mayora de los alimentos ricos en carbohidratos contiene alrededor de 15g de carbohidratos. La siguiente [employer] tamaos de porcin de los alimentos ricos en carbohidratos que contienen alrededor de 15g de carbohidratos por porcin:   1rebanada de pan (1oz) o 1tortilla de seis pulgadas.  panecillo de hamburguesa o bollito tipo ingls.  4a 6galletas.   de taza de cereal sin azcar y seco.   taza de cereal caliente.   de taza de arroz o pastas.  taza de pur de papas o de una papa grande al horno.  1taza de frutas frescas o una fruta pequea.  taza de frutas o jugo de frutas enlatados o congelados.  1 taza [employer].   de taza de yogur descremado sin ningn agregado o de yogur endulzado con edulcorante artificial.  taza de vegetales almidonados, como guisantes, maz o papas, o de frijoles secos cocidos. Decida la cantidad de porciones Advertising copywriter. Multiplique la cantidad de porciones por 15 (los gramos de carbohidratos en esa porcin). Por ejemplo, si come 2tazas de fresas, habr comido 2porciones y 30g de carbohidratos (2porciones x 15g = 30g). Para las  comidas como sopas y guisos, en las que se mezcla ms de un alimento, deber [place] [employer] carbohidratos de cada alimento incluido. EJEMPLO DE RECUENTO DE CARBOHIDRATOS Ejemplo de cena  3 onzas de pechugas de pollo.   de taza de arroz integral.   taza de maz.  1 taza de  [place].  1 taza de fresas con crema batida sin azcar. Clculo de carbohidratos [place] 1: Identifique los alimentos que contienen carbohidratos:   Arroz.  Maz.  Leche.  Jinny Sanders. [place] 2: Calcule el nmero de porciones que consumir de cada uno:   2 porciones de Surveyor, minerals.  1 porcin de maz.  1 porcin de leche.  1 porcin de fresas. [place] 3: Multiplique cada una de esas porciones por 15g:   2 porciones de arroz x 15 g = 30 g.  1 porcin de maz x 15 g = 15 g.  1 porcin de leche x 15 g = 15 g.  1 porcin de fresas x 15 g = 15 g. [place] 4: Sume todas las cantidades para Artist total de gramos de carbohidratos consumidos: 30 g + 15 g + 15 g + 15 g = 75 g.   Esta informacin no tiene Theme park manager el consejo del mdico. Asegrese de hacerle al mdico cualquier pregunta que tenga.   Document Released: 05/10/2011 Document Revised: 03/08/2014 Elsevier Interactive Patient Education 2016 [employer].   Hipoglucemia (Hypoglycemia) Un bajo nivel de azcar en la sangre (hipoglucemia) implica que es menor de lo que debera ser. Algunos signos de hipoglucemia son:  Transpiracin abundante.  Aumento del apetito.  Sensacin de [employer] o debilidad.  Sentir ms sueo que lo habitual.  Nerviosismo.  Dolor de [place].  Pulsaciones rpidas. El nivel de azcar en sangre puede descender rpidamente y puede ser Radio broadcast assistant. Su mdico puede hacerle estudios para Medical sales representative. Puede ser que tenga hipoglucemia y no sea diabtico. CUIDADOS EN EL HOGAR  Controle el nivel de azcar en la sangre como lo indique su mdico. Si es menor de 70 mg / dl , o el que le indique su mdico, tome una de las siguientes medidas:  3  4 tabletas de glucosa.   taza de jugo liviano.   taza de gaseosa que no sea diettica.  1 taza de leche  5  6 caramelos duros.  Vuelva a controlarse despus de 15 minutos. Repita hasta que el nivel sea el [place].  Tome una colacin si falta  ms de 1 hora hasta la prxima comida.  Tome los medicamentos tal como se los prescribi el mdico.  No saltee las comidas. Coma siempre a la misma hora.  Debe evitar las bebidas alcohlicas, excepto con las comidas.  Controle su nivel de glucosa antes de conducir.  Controle su nivel de glucosa antes y despus de hacer actividad fsica.  Lleve siempre los medicamentos con usted,como por ejemplo las pastillas para la glucosa.  Siempre use un brazalete de alerta mdica si es diabtico. SOLICITE AYUDA DE INMEDIATO SI:   El nivel de glucosa desciende por debajo de 70 mg / dl o el que le indique su mdico, y:  Se siente confundido  No puede tragar.  Se desmaya.  No puede solucionarlo por sus propios medios. Puede ser necesario que alguien lo ayude.  Este problema le ocurre con frecuencia.  Tiene algn problema con los medicamentos.  No se siente bien despus de 3  4 das.  Observa cambios en la visin. ASEGRESE DE QUE:   Comprende  estas instrucciones.  Controlar la enfermedad.  Solicitar ayuda de inmediato si no mejora o empeora.   Esta informacin no tiene Theme park manager el consejo del mdico. Asegrese de hacerle al mdico cualquier pregunta que tenga.   Document Released: 03/20/2010 Document Revised: 03/08/2014 Elsevier Interactive Patient Education [employer]

## 2015-04-24 NOTE — Progress Notes (Signed)
S:    Patient arrives in good spirits with her daughter and granddaughter.  Presents for diabetes evaluation, education, and management at the request of Dr. Hyman Hopes. Patient was referred on 03/20/15.  Patient was last seen by Primary Care Provider on 03/20/15.  Patient is Spanish speaking and a Education officer, community was used for the duration of the visit Madaline Guthrie [id number]).  Patient reports adherence with medications.  Current diabetes medications include: Lantus 15 units daily, regular insulin 5 units before each meal.  Current hypertension medications include: amlodipine 10 mg daily, carvedilol 25 mg BID, hydralazine 100 mg TID, isosorbide mononitrate 60 mg daily, and losartan 50 mg daily.   Patient denies hypoglycemic events.  Patient reported dietary habits: tries to watch carb intake and eat meats and vegetables.  Patient reported exercise habits: none   Patient reports nocturia.  Patient reports neuropathy. Patient denies visual changes. Patient reports self foot exams.   Patient reports that she has not used her meter to check her blood glucose in months due to anemia. She was told that she would be unable to check them due to the anemia.   Patient also reports that she is moving permanently back to [place] in the next few weeks. She has a doctor that she can follow up with there but will need to get a month's supply of her medications prior to the move in case she cannot be seen right away.    O:  Lab Results  Component Value Date   HGBA1C 8.60 03/20/2015   Filed Vitals:   04/24/15 1121  BP: 158/78  Pulse: 92    Patient does not have home CBG readings   A/P: Type 1 Diabetes longstanding currently uncontrolled based on A1c of 8.6. Patient denies hypoglycemic events and is able to verbalize appropriate hypoglycemia management plan. Patient reports adherence with medication. Control is suboptimal due to sedentary lifestyle.  Continue Lantus 15 units daily and regular  insulin 5 units before each meal. I am very hesitant to increase her insulin as she is unable to monitor her blood glucose at home and she is moving back to [place] in a few weeks permanently. I think she is able to check her blood glucose despite the anemia so I encouraged her to check it and see what results she gets. If she gets readings that are too low or too high (>200) in the next few weeks, she can follow up with myself or. Dr. Hyman Hopes prior to moving to [place].  Hypertension longstanding/currently uncontrolled.  Patient reports adherence with medication. Control is suboptimal due to disease progression. Again, I am hesitant to change her medications because she is moving back to [place] and I am worried about the blood pressure becoming too low on dialysis and not having appropriate follow up.  Written patient instructions provided.  Total time in face to face counseling 20 minutes.  Follow up in Pharmacist Clinic Visit if need prior to move back to [place].

## 2015-05-05 MED FILL — NovoLIN R 100 UNIT/ML SOLN: 100 | 23 days supply | Qty: 10 | Fill #0

## 2015-05-05 MED FILL — LOSARTAN POTASSIUM 50 MG TA: 50 | 30 days supply | Qty: 30 | Fill #2

## 2015-05-05 MED FILL — hydrALAZINE HCL 100 MG TABS: 100 | 30 days supply | Qty: 90 | Fill #1

## 2015-05-05 MED FILL — ?AMLODIPINE BESYLATE 10 MG: 10 | 30 days supply | Qty: 30 | Fill #3

## 2015-05-05 MED FILL — OMEPRAZOLE DR 20 MG CAPSULE: 20 | 30 days supply | Qty: 30 | Fill #2

## 2015-05-05 MED FILL — GABAPENTIN 100 MG CAPSULE: 100 | 30 days supply | Qty: 90 | Fill #1

## 2015-05-05 MED FILL — ?CARVEDILOL 25 MG TABLET: 25 | 30 days supply | Qty: 60 | Fill #1

## 2015-05-05 MED FILL — raNITIdine HCL 150 MG TABS: 150 | 30 days supply | Qty: 30 | Fill #6

## 2015-05-05 MED FILL — ISOSORBIDE MN ER 60 MG TAB: 60 | 30 days supply | Qty: 30 | Fill #1

## 2015-05-08 MED FILL — LANTUS SOLOSTAR 100 UNITS/M: 100 | 40 days supply | Qty: 6 | Fill #1

## 2015-05-13 MED FILL — PRAVASTATIN NA 40 MG TAB: 40 | 30 days supply | Qty: 30 | Fill #2

## 2015-05-13 MED FILL — FLUoxetine HCL 40 MG CAPS: 40 | 30 days supply | Qty: 30 | Fill #2

## 2016-04-06 IMAGING — CT CT ANGIO CHEST
2 of 10 series · 19 of 46 positions shown · IV contrast (Omni 300)
Comparison: 08/21/2014

CLINICAL DATA: Shortness of breath and chest pressure since
yesterday.

EXAM:
CT ANGIOGRAPHY CHEST WITH CONTRAST
TECHNIQUE: Multidetector CT imaging of the chest was performed using the
standard protocol during bolus administration of intravenous
contrast. Multiplanar CT image reconstructions and MIPs were
obtained to evaluate the vascular anatomy.
CONTRAST:  100mL OMNIPAQUE IOHEXOL 350 MG/ML SOLN

[Series 5: thins · axial · 0.66mm/px · z∈[+473,+713]mm · 16 of 272 slices shown]
[im 16/272  lung]
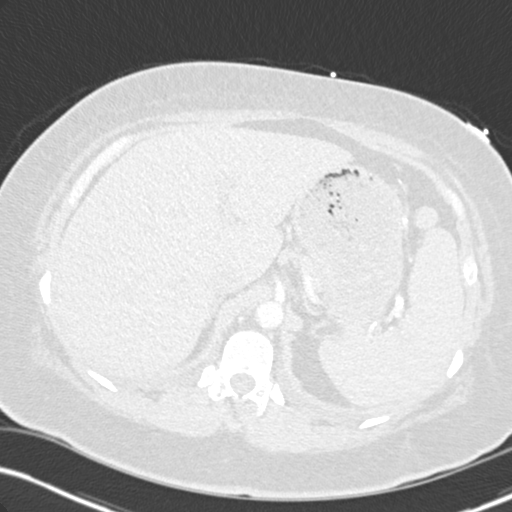
[im 31/272  soft-tissue]
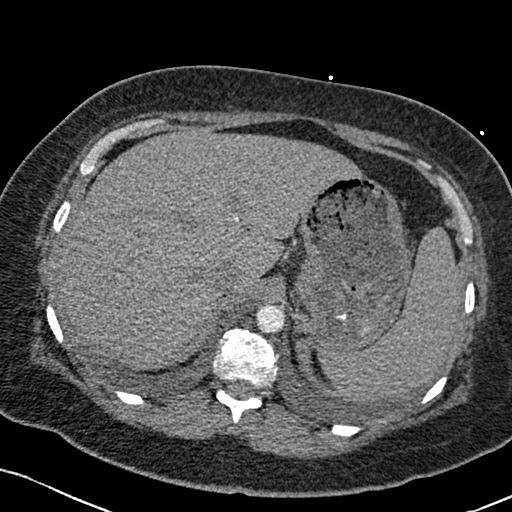
[im 46/272  lung]
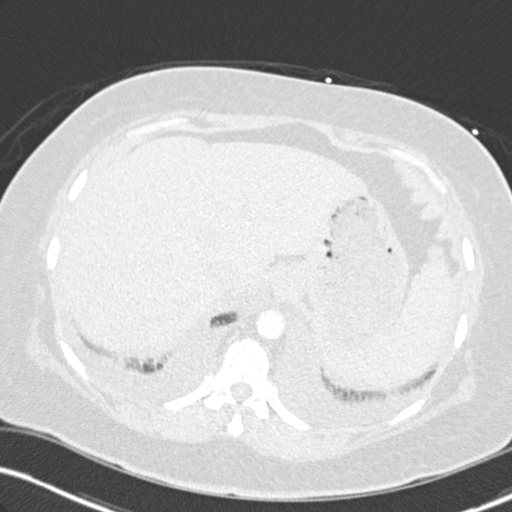
[im 61/272  soft-tissue]
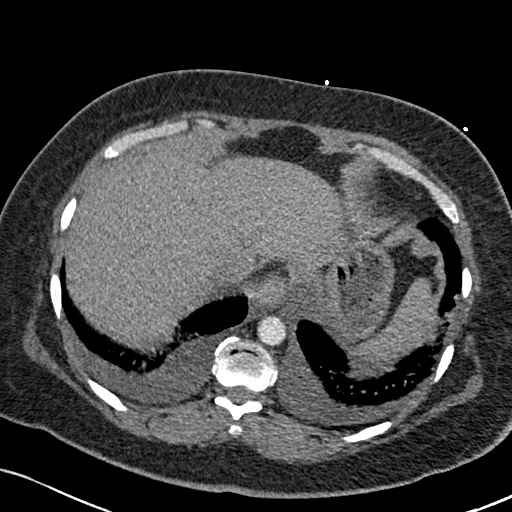
[im 76/272  lung]
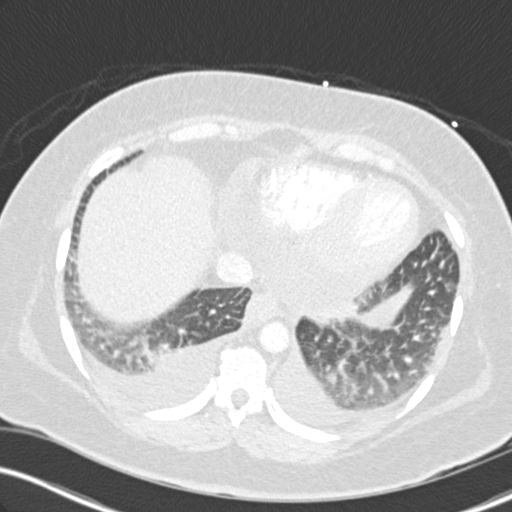
[im 91/272  soft-tissue]
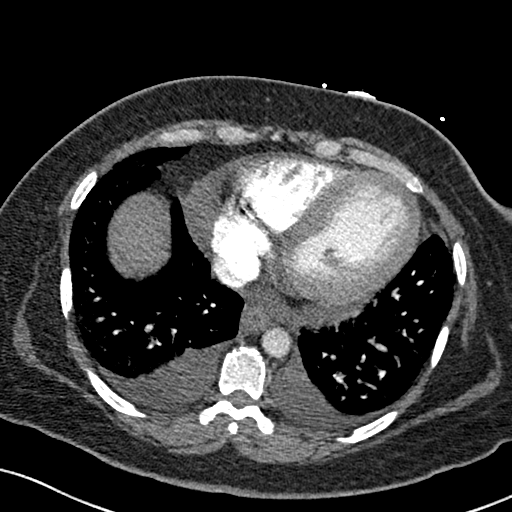
[im 106/272  lung]
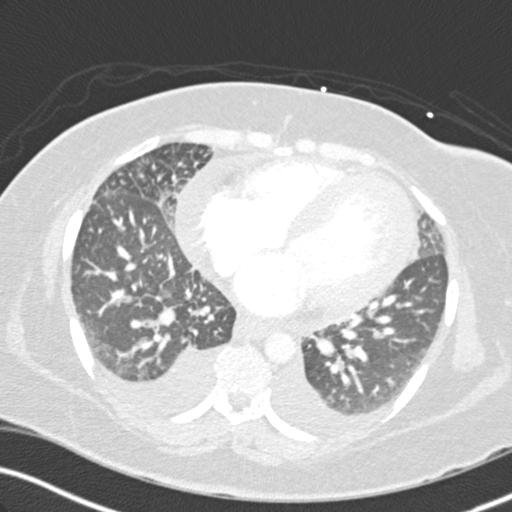
[im 121/272  soft-tissue]
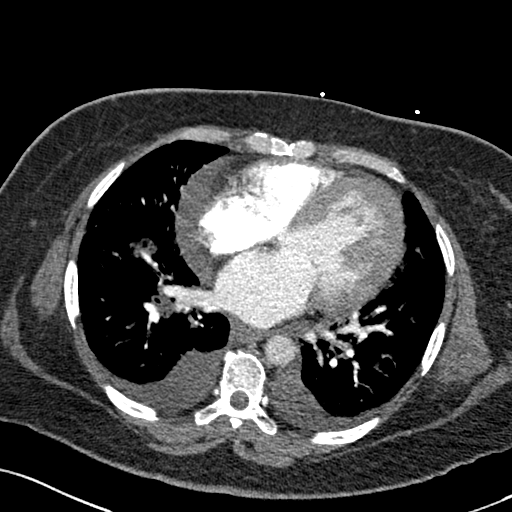
[im 151/272  lung]
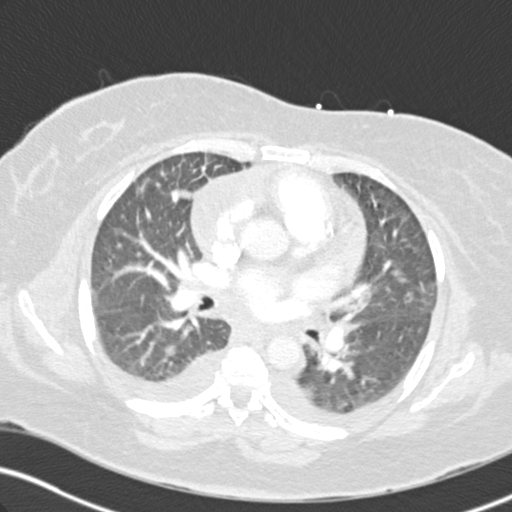
[im 166/272  soft-tissue]
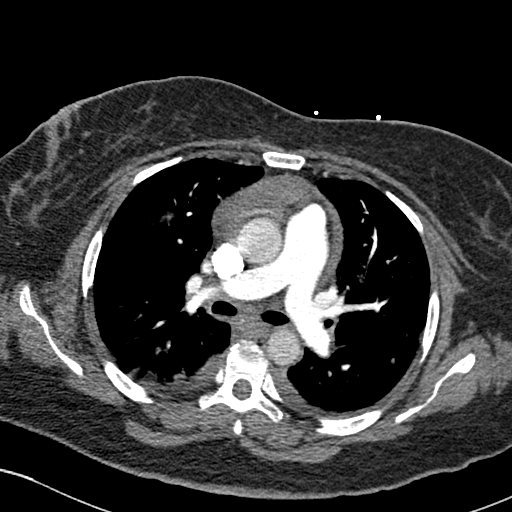
[im 181/272  lung]
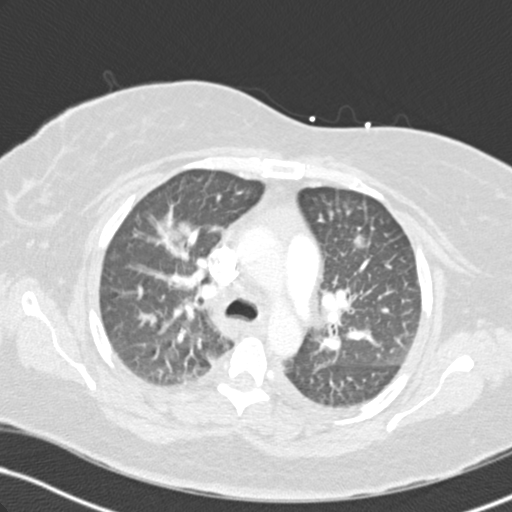
[im 196/272  soft-tissue]
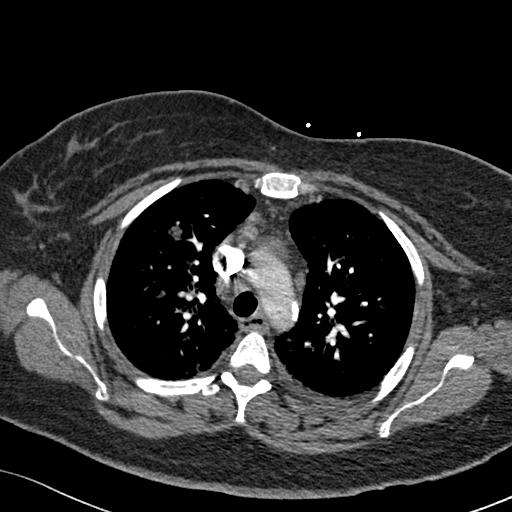
[im 211/272  lung]
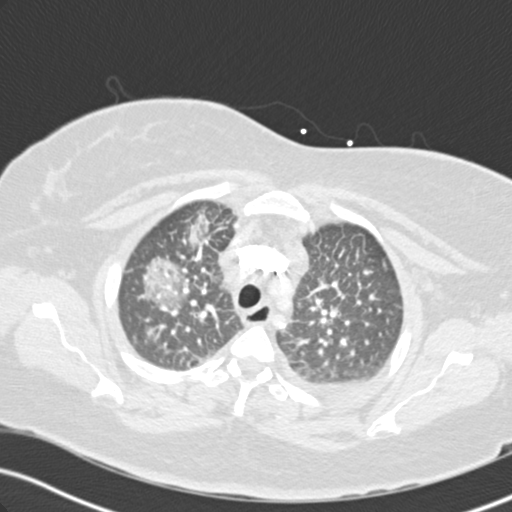
[im 226/272  soft-tissue]
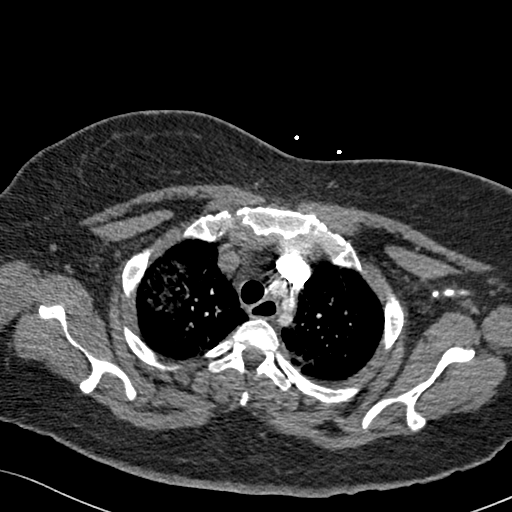
[im 241/272  lung]
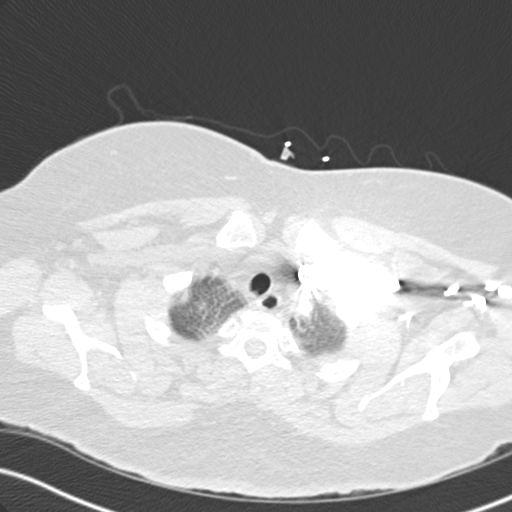
[im 256/272  soft-tissue]
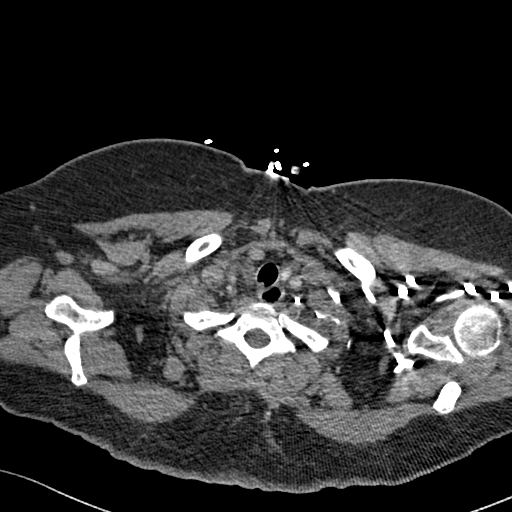

[Series 7: coronal mpr · coronal · 0.59mm/px · 3 of 115 slices shown]
[im 29/115  soft-tissue]
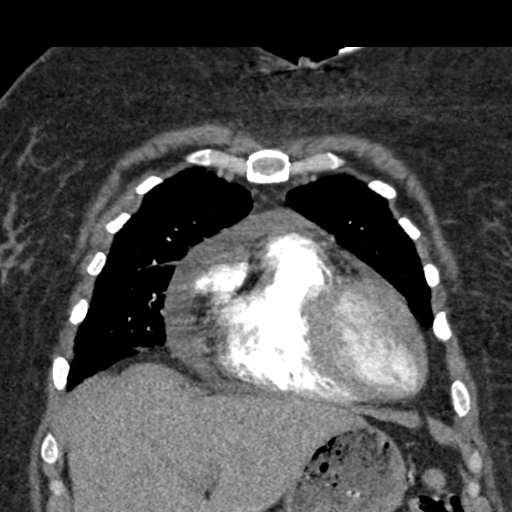
[im 58/115  soft-tissue]
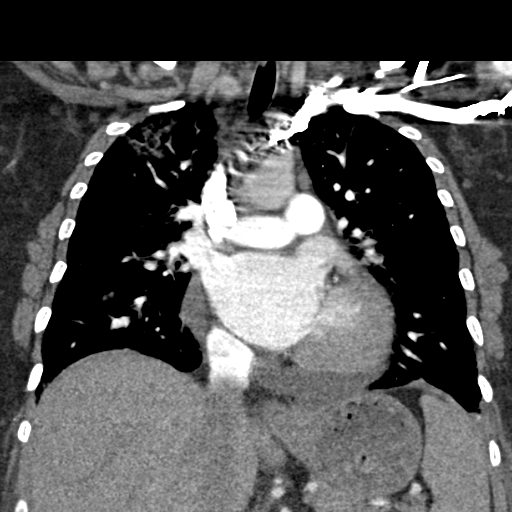
[im 86/115  soft-tissue]
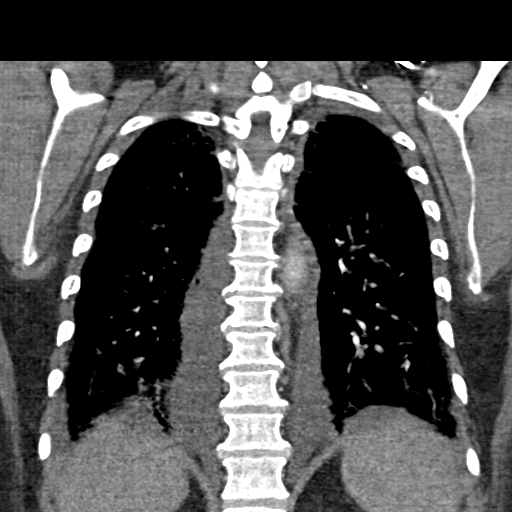

[19 of 46 positions shown; findings below may reference images not displayed]

FINDINGS: There is adequate opacification of the pulmonary arteries. There is
no pulmonary embolus. The main pulmonary artery, right main
pulmonary artery and left main pulmonary arteries are normal in
size. The heart size is normal. There is a small stable pericardial
effusion. There is coronary artery atherosclerosis in the LAD.

There are small bilateral pleural effusions. There are patchy
rounded areas of airspace disease in bilateral upper lobes, right
lower lobe and to a lesser degree left lower lobe. There is no
pneumothorax.

There is no axillary, hilar, or mediastinal adenopathy.

There is no lytic or blastic osseous lesion.

The visualized portions of the upper abdomen are unremarkable.

Review of the MIP images confirms the above findings.
IMPRESSION: 1. No evidence of pulmonary embolus.
2. There are patchy rounded areas of airspace disease in bilateral
upper lobes, right lower lobe and to a lesser degree left lower
lobe. The appearance is concerning for multilobar pneumonia
including atypical infection and granulomatous infection.
Alternatively, septic emboli are in the differential diagnosis.
